# Patient Record
Sex: Male | Born: 1935 | Race: White | Hispanic: No | Marital: Married | Smoking: Former smoker
Health system: Southern US, Community
[De-identification: ages and names within clinical notes are randomized; demographics above are authoritative.]

## PROBLEM LIST (undated history)

## (undated) DIAGNOSIS — C801 Malignant (primary) neoplasm, unspecified: Secondary | ICD-10-CM

## (undated) DIAGNOSIS — J111 Influenza due to unidentified influenza virus with other respiratory manifestations: Secondary | ICD-10-CM

## (undated) DIAGNOSIS — J449 Chronic obstructive pulmonary disease, unspecified: Secondary | ICD-10-CM

## (undated) DIAGNOSIS — A09 Infectious gastroenteritis and colitis, unspecified: Secondary | ICD-10-CM

## (undated) DIAGNOSIS — R42 Dizziness and giddiness: Secondary | ICD-10-CM

## (undated) DIAGNOSIS — C4491 Basal cell carcinoma of skin, unspecified: Secondary | ICD-10-CM

## (undated) DIAGNOSIS — L719 Rosacea, unspecified: Secondary | ICD-10-CM

## (undated) HISTORY — DX: Infectious gastroenteritis and colitis, unspecified: A09

## (undated) HISTORY — DX: Malignant (primary) neoplasm, unspecified: C80.1

## (undated) HISTORY — PX: MELANOMA EXCISION WITH SENTINEL LYMPH NODE BIOPSY: SHX5267

## (undated) HISTORY — DX: Rosacea, unspecified: L71.9

## (undated) HISTORY — DX: Chronic obstructive pulmonary disease, unspecified: J44.9

---
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
## 1962-11-17 DIAGNOSIS — C439 Malignant melanoma of skin, unspecified: Secondary | ICD-10-CM

## 1962-11-17 HISTORY — DX: Malignant melanoma of skin, unspecified: C43.9

## 1963-11-18 DIAGNOSIS — C801 Malignant (primary) neoplasm, unspecified: Secondary | ICD-10-CM

## 1963-11-18 HISTORY — DX: Malignant (primary) neoplasm, unspecified: C80.1

## 1983-11-18 HISTORY — PX: SOFT TISSUE TUMOR RESECTION: SHX1054

## 1998-11-17 DIAGNOSIS — A09 Infectious gastroenteritis and colitis, unspecified: Secondary | ICD-10-CM

## 1998-11-17 HISTORY — DX: Infectious gastroenteritis and colitis, unspecified: A09

## 2006-01-19 ENCOUNTER — Ambulatory Visit: Payer: Self-pay | Admitting: Gastroenterology

## 2010-02-03 LAB — HM COLONOSCOPY

## 2011-02-10 ENCOUNTER — Ambulatory Visit: Payer: Self-pay | Admitting: Gastroenterology

## 2011-02-12 LAB — PATHOLOGY REPORT

## 2011-12-05 ENCOUNTER — Encounter: Payer: Self-pay | Admitting: Internal Medicine

## 2011-12-05 ENCOUNTER — Ambulatory Visit (INDEPENDENT_AMBULATORY_CARE_PROVIDER_SITE_OTHER): Payer: Medicare Other | Admitting: Internal Medicine

## 2011-12-05 DIAGNOSIS — Z0000 Encounter for general adult medical examination without abnormal findings: Secondary | ICD-10-CM

## 2011-12-05 DIAGNOSIS — E785 Hyperlipidemia, unspecified: Secondary | ICD-10-CM

## 2011-12-05 DIAGNOSIS — R5383 Other fatigue: Secondary | ICD-10-CM

## 2011-12-05 DIAGNOSIS — Z85820 Personal history of malignant melanoma of skin: Secondary | ICD-10-CM

## 2011-12-05 DIAGNOSIS — A09 Infectious gastroenteritis and colitis, unspecified: Secondary | ICD-10-CM

## 2011-12-05 DIAGNOSIS — E559 Vitamin D deficiency, unspecified: Secondary | ICD-10-CM

## 2011-12-05 DIAGNOSIS — C801 Malignant (primary) neoplasm, unspecified: Secondary | ICD-10-CM

## 2011-12-05 NOTE — Progress Notes (Signed)
Subjective:    Patient ID: Jon Sherman, male    DOB: 1928-09-26, 78 y.o.   MRN: [id number]  HPI  Review of Systems    Objective:   Physical Exam    Assessment & Plan:   Subjective:    Jon Sherman is a 78 y.o. male who presents for Medicare Annual/Subsequent preventive examination.   Preventive Screening-Counseling & Management  Tobacco History  Smoking status  . Former Smoker  Smokeless tobacco  . Not on file    Problems Prior to Visit 1. History of Melanoma 2. Colonic Polyps with FH of colon CA in cousin  3. Rosacea  Current Problems (verified) There is no problem list on file for this patient.   Medications Prior to Visit No current outpatient prescriptions on file prior to visit.    Current Medications (verified) Current Outpatient Prescriptions  Medication Sig Dispense Refill  . aspirin 81 MG tablet Take 160 mg by mouth daily.      . Sodium Sulfide 1 % GEL Apply topically 2 (two) times daily.         Allergies (verified) Demerol   PAST HISTORY  Family History No family history on file.  Social History History  Substance Use Topics  . Smoking status: Former Games developer  . Smokeless tobacco: Not on file  . Alcohol Use: Yes     Occasional wine and beer with meal     Are there smokers in your home (other than you)?  No  Risk Factors Current exercise habits: Gym/ health club routine includes walking on track .  Dietary issues discussed: none   Cardiac risk factors: advanced age (older than 32 for men, 15 for women).  Depression Screen (Note: if answer to either of the following is "Yes", a more complete depression screening is indicated)   Q1: Over the past two weeks, have you felt down, depressed or hopeless? No  Q2: Over the past two weeks, have you felt little interest or pleasure in doing things? No  Have you lost interest or pleasure in daily life? No  Do you often feel hopeless? No  Do you cry easily over simple problems?  No  Activities of Daily Living In your present state of health, do you have any difficulty performing the following activities?:  Driving? No Managing money?  No Feeding yourself? No Getting from bed to chair? No Climbing a flight of stairs? No Preparing food and eating?: No Bathing or showering? No Getting dressed: No Getting to the toilet? No Using the toilet:No Moving around from place to place: No In the past year have you fallen or had a near fall?:No   Are you sexually active?  No  Do you have more than one partner?  No  Hearing Difficulties: No Do you often ask people to speak up or repeat themselves? No Do you experience ringing or noises in your ears? No Do you have difficulty understanding soft or whispered voices? No   Do you feel that you have a problem with memory? No  Do you often misplace items? No  Do you feel safe at home?  Yes  Cognitive Testing  Alert? Yes  Normal Appearance?Yes  Oriented to person? Yes  Place? Yes   Time? Yes  Recall of three objects?  Yes  Can perform simple calculations? Yes  Displays appropriate judgment?Yes  Can read the correct time from a watch face?Yes   Advanced Directives have been discussed with the patient? Yes   List the Names of  Other Physician/Practitioners you currently use: 1.    Indicate any recent Medical Services you may have received from other than [facility] providers in the past year (date may be approximate).   There is no immunization history on file for this patient.  Screening Tests No health maintenance topics applied.  All answers were reviewed with the patient and necessary referrals were made:  Duncan Dull, MD   12/05/2011   History reviewed: allergies, current medications, past family history, past medical history, past social history, past surgical history and problem list  Review of Systems A comprehensive review of systems was negative.    Objective:     Vision by Snellen chart: right  ZOX:WRUEAVW declines measurement, left UJW:JXBJYNW declines measurement Blood pressure 120/78, pulse 80, temperature 98.6 F (37 C), temperature source Oral, resp. rate 12, height 5\' 8"  (1.727 m), weight 187 lb (84.823 kg), SpO2 99.00%. Body mass index is 28.43 kg/(m^2).  BP 120/78  Pulse 80  Temp(Src) 98.6 F (37 C) (Oral)  Resp 12  Ht 5\' 8"  (1.727 m)  Wt 187 lb (84.823 kg)  BMI 28.43 kg/m2  SpO2 99%  General Appearance:    Alert, cooperative, no distress, appears stated age  Head:    Normocephalic, without obvious abnormality, atraumatic  Eyes:    PERRL, conjunctiva/corneas clear, EOM's intact, fundi    benign, both eyes       Ears:    Normal TM's and external ear canals, both ears  Nose:   Nares normal, septum midline, mucosa normal, no drainage    or sinus tenderness  Throat:   Lips, mucosa, and tongue normal; teeth and gums normal  Neck:   Supple, symmetrical, trachea midline, no adenopathy;       thyroid:  No enlargement/tenderness/nodules; no carotid   bruit or JVD  Back:     Symmetric, no curvature, ROM normal, no CVA tenderness  Lungs:     Clear to auscultation bilaterally, respirations unlabored  Chest wall:    No tenderness or deformity  Heart:    Regular rate and rhythm, S1 and S2 normal, no murmur, rub   or gallop  Abdomen:     Soft, non-tender, bowel sounds active all four quadrants,    no masses, no organomegaly.  Large surgical scar from prior melanoma excission   Genitalia:    Normal male without lesion, discharge or tenderness  Rectal:    Normal tone, normal prostate, no masses or tenderness;   guaiac negative stool  Extremities:   Extremities normal, atraumatic, no cyanosis or edema  Pulses:   2+ and symmetric all extremities  Skin:   Skin color, texture, turgor normal, no rashes or lesions  Lymph nodes:   Cervical, supraclavicular, and axillary nodes normal  Neurologic:   CNII-XII intact. Normal strength, sensation and reflexes      throughout         Assessment:    1.  Screening for prostate Ca:  DRE was done and normal. 2. Colonic Polyps:  Last colonoscopy 2011,  With polyps found and a FH of colon CA.  Repeat screening due 2014, [facility].      Plan:     During the course of the visit the patient was educated and counseled about appropriate screening and preventive services including:    Prostate cancer screening  Diet review for nutrition referral? Yes ____  Not Indicated _x___   Patient Instructions (the written plan) was given to the patient.  Medicare Attestation I have personally reviewed:  The patient's medical and social history Their use of alcohol, tobacco or illicit drugs Their current medications and supplements The patient's functional ability including ADLs,fall risks, home safety risks, cognitive, and hearing and visual impairment Diet and physical activities Evidence for depression or mood disorders  The patient's weight, height, BMI, and visual acuity have been recorded in the chart.  I have made referrals, counseling, and provided education to the patient based on review of the above and I have provided the patient with a written personalized care plan for preventive services.     Duncan Dull, MD   12/05/2011

## 2011-12-05 NOTE — Patient Instructions (Signed)
You should take 800 to 1000 units of Vit D3 daily, unless your level is low, requiring a prescription strength  I will check your vitamin d and thyroid with your future  labs.   Return for fasting labs at your convenience ( water is ok to drink beforehand)  An 8 hour fast is  sufficient

## 2011-12-07 ENCOUNTER — Encounter: Payer: Self-pay | Admitting: Internal Medicine

## 2011-12-07 DIAGNOSIS — L719 Rosacea, unspecified: Secondary | ICD-10-CM | POA: Insufficient documentation

## 2011-12-07 DIAGNOSIS — Z85820 Personal history of malignant melanoma of skin: Secondary | ICD-10-CM | POA: Insufficient documentation

## 2011-12-07 DIAGNOSIS — A09 Infectious gastroenteritis and colitis, unspecified: Secondary | ICD-10-CM | POA: Insufficient documentation

## 2011-12-07 NOTE — Assessment & Plan Note (Signed)
He has a huge scar on his abdomen  From the melanoma resection done in the 1960s.  He has had no recurrenc e and receives annual dermatologic followup at [facility] but is requesting change to local dermatologis,  Referral to [facility] made.

## 2011-12-07 NOTE — Assessment & Plan Note (Signed)
Remote hospitalization in 2000.  He has had no recurrent episodes.

## 2011-12-22 ENCOUNTER — Other Ambulatory Visit (INDEPENDENT_AMBULATORY_CARE_PROVIDER_SITE_OTHER): Payer: Medicare Other | Admitting: *Deleted

## 2011-12-22 DIAGNOSIS — E785 Hyperlipidemia, unspecified: Secondary | ICD-10-CM

## 2011-12-22 DIAGNOSIS — E559 Vitamin D deficiency, unspecified: Secondary | ICD-10-CM

## 2011-12-22 DIAGNOSIS — R5381 Other malaise: Secondary | ICD-10-CM

## 2011-12-22 DIAGNOSIS — R5383 Other fatigue: Secondary | ICD-10-CM

## 2011-12-22 LAB — COMPREHENSIVE METABOLIC PANEL
ALT: 19 U/L (ref 0–53)
AST: 16 U/L (ref 0–37)
Albumin: 3.9 g/dL (ref 3.5–5.2)
Alkaline Phosphatase: 66 U/L (ref 39–117)
BUN: 19 mg/dL (ref 6–23)
CO2: 28 mEq/L (ref 19–32)
Calcium: 8.6 mg/dL (ref 8.4–10.5)
Creatinine, Ser: 0.9 mg/dL (ref 0.4–1.5)
GFR: 81.73 mL/min (ref >60.00)
Glucose, Bld: 91 mg/dL (ref 70–99)

## 2011-12-22 LAB — CBC WITH DIFFERENTIAL/PLATELET
Basophils Absolute: 0 10*3/uL (ref 0.0–0.1)
Basophils Relative: 0.7 % (ref 0.0–3.0)
Hemoglobin: 14.7 g/dL (ref 13.0–17.0)
Lymphocytes Relative: 26.6 % (ref 12.0–46.0)
Lymphs Abs: 1.3 10*3/uL (ref 0.7–4.0)
Monocytes Absolute: 0.4 10*3/uL (ref 0.1–1.0)
Neutrophils Relative %: 61.6 % (ref 43.0–77.0)
RDW: 12.9 % (ref 11.5–14.6)
WBC: 5 10*3/uL (ref 4.5–10.5)

## 2011-12-22 LAB — TSH: TSH: 1.73 u[IU]/mL (ref 0.35–5.50)

## 2011-12-22 LAB — LIPID PANEL
Total CHOL/HDL Ratio: 3
Triglycerides: 84 mg/dL (ref 0.0–149.0)

## 2011-12-26 MED ORDER — ERGOCALCIFEROL 1.25 MG (50000 UT) PO CAPS: 50000.0000 [IU] | ORAL_CAPSULE | ORAL | Status: AC

## 2011-12-26 NOTE — Progress Notes (Signed)
Addended by: Duncan Dull on: 12/26/2011 08:34 AM   Modules accepted: Orders

## 2011-12-29 ENCOUNTER — Other Ambulatory Visit: Payer: Self-pay | Admitting: *Deleted

## 2011-12-29 MED ORDER — ERGOCALCIFEROL 1.25 MG (50000 UT) PO CAPS: 50000.0000 [IU] | ORAL_CAPSULE | ORAL | Status: DC

## 2012-02-19 ENCOUNTER — Other Ambulatory Visit: Payer: Self-pay | Admitting: Internal Medicine

## 2012-02-19 MED ORDER — ERGOCALCIFEROL 1.25 MG (50000 UT) PO CAPS: 50000.0000 [IU] | ORAL_CAPSULE | ORAL | Status: AC

## 2012-06-07 ENCOUNTER — Encounter: Payer: Self-pay | Admitting: *Deleted

## 2012-10-22 ENCOUNTER — Other Ambulatory Visit: Payer: Self-pay | Admitting: Internal Medicine

## 2012-12-23 ENCOUNTER — Telehealth: Payer: Self-pay | Admitting: Internal Medicine

## 2012-12-23 NOTE — Telephone Encounter (Signed)
Pt notified to call [facility] to set up that appt.

## 2012-12-23 NOTE — Telephone Encounter (Signed)
Pt states he has received a letter from [facility] that he needs to set up a colonoscopy.  Pt has no upcoming appts to see Dr. Darrick Huntsman.  Please advise.

## 2013-02-18 ENCOUNTER — Encounter: Payer: Self-pay | Admitting: Internal Medicine

## 2013-02-18 ENCOUNTER — Ambulatory Visit (INDEPENDENT_AMBULATORY_CARE_PROVIDER_SITE_OTHER): Payer: Medicare PPO | Admitting: Internal Medicine

## 2013-02-18 VITALS — BP 128/68 | HR 70 | Temp 97.4°F | Resp 16 | Ht 69.0 in | Wt 191.5 lb

## 2013-02-18 DIAGNOSIS — R5381 Other malaise: Secondary | ICD-10-CM

## 2013-02-18 DIAGNOSIS — Z860100 Personal history of colon polyps, unspecified: Secondary | ICD-10-CM

## 2013-02-18 DIAGNOSIS — E559 Vitamin D deficiency, unspecified: Secondary | ICD-10-CM

## 2013-02-18 DIAGNOSIS — Z0000 Encounter for general adult medical examination without abnormal findings: Secondary | ICD-10-CM

## 2013-02-18 DIAGNOSIS — Z85820 Personal history of malignant melanoma of skin: Secondary | ICD-10-CM

## 2013-02-18 DIAGNOSIS — R5383 Other fatigue: Secondary | ICD-10-CM

## 2013-02-18 MED ORDER — TETANUS-DIPHTH-ACELL PERTUSSIS 5-2.5-18.5 LF-MCG/0.5 IM SUSP: 0.5000 mL | Freq: Once | INTRAMUSCULAR | Status: DC

## 2013-02-18 MED ORDER — DTAP-HEPATITIS B RECOMB-IPV IM SUSP: 0.5000 mL | Freq: Once | INTRAMUSCULAR | Status: DC

## 2013-02-18 NOTE — Progress Notes (Signed)
Patient ID: Jon Bhola., male   DOB: 03-09-1928, 79 y.o.   MRN: [id number]   The patient is here for his annual Medicare wellness examination and management of other chronic and acute problems. He is feeling well today and has no acute or chronic complaints. He had an episode of hip pain lasting about 3 days several weeks ago which resolved spontaneously.    The risk factors are reflected in the social history.  The roster of all physicians providing medical care to patient - is listed in the Snapshot section of the chart.  Activities of daily living:  The patient is 100% independent in all ADLs: dressing, toileting, feeding as well as independent mobility  Home safety : The patient has smoke detectors in the home. They wear seatbelts.  There are no firearms at home. There is no violence in the home.   There is no risks for hepatitis, STDs or HIV. There is no   history of blood transfusion. They have no travel history to infectious disease endemic areas of the world.  The patient has seen their dentist in the last six month. They have seen their eye doctor in the last year. They report no hearing difficulty with regard to whispered voices or television programs.  They have deferred audiologic testing in the last year.  They do not  have excessive sun exposure. Discussed the need for sun protection: hats, long sleeves and use of sunscreen if there is significant sun exposure.   Diet: the importance of a healthy diet is discussed. They do have a healthy diet.  The benefits of regular aerobic exercise were discussed. She walks 4 times per week ,  20 minutes.   Depression screen: there are no signs or vegative symptoms of depression- irritability, change in appetite, anhedonia, sadness/tearfullness.  Cognitive assessment: the patient manages all their financial and personal affairs and is actively engaged. They could relate day,date,year and events; recalled 2/3 objects at 3 minutes;  performed clock-face test normally.  The following portions of the patient's history were reviewed and updated as appropriate: allergies, current medications, past family history, past medical history,  past surgical history, past social history  and problem list.  Visual acuity was not assessed per patient preference since she has regular follow up with her ophthalmologist. Hearing and body mass index were assessed and reviewed.   During the course of the visit the patient was educated and counseled about appropriate screening and preventive services including : fall prevention , diabetes screening, nutrition counseling, colorectal cancer screening, and recommended immunizations.    Objective  BP 128/68  Pulse 70  Temp(Src) 97.4 F (36.3 C) (Oral)  Resp 16  Ht 5\' 9"  (1.753 m)  Wt 191 lb 8 oz (86.864 kg)  BMI 28.27 kg/m2  SpO2 99%  General Appearance:    Alert, cooperative, no distress, appears stated age  Head:    Normocephalic, without obvious abnormality, atraumatic  Eyes:    PERRL, conjunctiva/corneas clear, EOM's intact, fundi    benign, both eyes       Ears:    Normal TM's and external ear canals, both ears  Nose:   Nares normal, septum midline, mucosa normal, no drainage   or sinus tenderness  Throat:   Lips, mucosa, and tongue normal; teeth and gums normal  Neck:   Supple, symmetrical, trachea midline, no adenopathy;       thyroid:  No enlargement/tenderness/nodules; no carotid   bruit or JVD  Back:  Symmetric, no curvature, ROM normal, no CVA tenderness  Lungs:     Clear to auscultation bilaterally, respirations unlabored  Chest wall:    No tenderness or deformity  Heart:    Regular rate and rhythm, S1 and S2 normal, no murmur, rub   or gallop  Abdomen:     Soft, non-tender, bowel sounds active all four quadrants,    no masses, no organomegaly  Extremities:   Extremities normal, atraumatic, no cyanosis or edema  Pulses:   2+ and symmetric all extremities  Skin:    Skin color, texture, turgor normal, no rashes or lesions  Lymph nodes:   Cervical, supraclavicular, and axillary nodes normal  Neurologic:   CNII-XII intact. Normal strength, sensation and reflexes      throughout   Assessment and Plan  Personal history of colonic polyps And his last colonoscopy was in 2011 and he has already seen Dr. Marva Panda to plan repeat colonoscopy this year.  History of melanoma He receives regular followup by his dermatologist on an annual basis. There has been no recurrence of melanoma.  Routine general medical examination at a health care facility Annual comprehensive male exam was done excluding  testicular and prostate exam.  He is up-to-date on regular screenings.   Updated Medication List Outpatient Encounter Prescriptions as of 02/18/2013  Medication Sig Dispense Refill  . aspirin 81 MG tablet Take 160 mg by mouth daily.      . [EXPIRED] ergocalciferol (DRISDOL) 50000 UNITS capsule Take 1 capsule (50,000 Units total) by mouth once a week.  12 capsule  2  . Sodium Sulfide 1 % GEL Apply topically 2 (two) times daily.      . Vitamin D, Ergocalciferol, (DRISDOL) 50000 UNITS CAPS TAKE 1 CAPSULE (50,000 UNITS TOTAL) BY MOUTH ONCE A WEEK.  12 capsule  2  . TDaP (BOOSTRIX) 5-2.5-18.5 LF-MCG/0.5 injection Inject 0.5 mLs into the muscle once.  0.5 mL  0  . [DISCONTINUED] DTAP-hepatitis B recombinant-IPV (PEDIARIX) injection Inject 0.5 mLs into the muscle once.  0.5 mL  0   No facility-administered encounter medications on file as of 02/18/2013.

## 2013-02-18 NOTE — Patient Instructions (Addendum)
   You may use ibuprofen 2 caplets and 1 tylenol every 8 hours as needed for joint pain  Try using Eucerin or Aveeno moisturizer on your legs  Return at your convenience for fasting labs (black coffee only ,  Or of course water is recommended!)

## 2013-02-19 DIAGNOSIS — Z860100 Personal history of colon polyps, unspecified: Secondary | ICD-10-CM | POA: Insufficient documentation

## 2013-02-19 DIAGNOSIS — Z0001 Encounter for general adult medical examination with abnormal findings: Secondary | ICD-10-CM | POA: Insufficient documentation

## 2013-02-19 DIAGNOSIS — Z0000 Encounter for general adult medical examination without abnormal findings: Secondary | ICD-10-CM | POA: Insufficient documentation

## 2013-02-19 NOTE — Assessment & Plan Note (Signed)
And his last colonoscopy was in 2011 and he has already seen Dr. Marva Panda to plan repeat colonoscopy this year.

## 2013-02-19 NOTE — Assessment & Plan Note (Signed)
He receives regular followup by his dermatologist on an annual basis. There has been no recurrence of melanoma.

## 2013-02-19 NOTE — Assessment & Plan Note (Signed)
Annual comprehensive male exam was done excluding  testicular and prostate exam.  He is up-to-date on regular screenings.

## 2013-03-01 ENCOUNTER — Other Ambulatory Visit (INDEPENDENT_AMBULATORY_CARE_PROVIDER_SITE_OTHER): Payer: Medicare PPO

## 2013-03-01 DIAGNOSIS — R5383 Other fatigue: Secondary | ICD-10-CM

## 2013-03-01 DIAGNOSIS — Z0000 Encounter for general adult medical examination without abnormal findings: Secondary | ICD-10-CM

## 2013-03-01 DIAGNOSIS — E559 Vitamin D deficiency, unspecified: Secondary | ICD-10-CM

## 2013-03-01 DIAGNOSIS — R5381 Other malaise: Secondary | ICD-10-CM

## 2013-03-01 DIAGNOSIS — Z136 Encounter for screening for cardiovascular disorders: Secondary | ICD-10-CM

## 2013-03-01 LAB — TSH: TSH: 2.72 u[IU]/mL (ref 0.35–5.50)

## 2013-03-01 LAB — LIPID PANEL
Cholesterol: 157 mg/dL (ref 0–200)
HDL: 45.7 mg/dL (ref >39.00)
VLDL: 26.2 mg/dL (ref 0.0–40.0)

## 2013-03-01 LAB — CBC WITH DIFFERENTIAL/PLATELET
Basophils Relative: 0.6 % (ref 0.0–3.0)
Eosinophils Relative: 3.3 % (ref 0.0–5.0)
HCT: 43.8 % (ref 39.0–52.0)
Hemoglobin: 14.9 g/dL (ref 13.0–17.0)
Lymphocytes Relative: 25.6 % (ref 12.0–46.0)
Lymphs Abs: 1.3 10*3/uL (ref 0.7–4.0)
MCV: 93.1 fl (ref 78.0–100.0)
Neutro Abs: 3.2 10*3/uL (ref 1.4–7.7)

## 2013-03-01 LAB — COMPREHENSIVE METABOLIC PANEL
AST: 15 U/L (ref 0–37)
Albumin: 4.2 g/dL (ref 3.5–5.2)
Alkaline Phosphatase: 60 U/L (ref 39–117)
BUN: 16 mg/dL (ref 6–23)
CO2: 27 mEq/L (ref 19–32)

## 2013-03-02 LAB — VITAMIN D 25 HYDROXY (VIT D DEFICIENCY, FRACTURES): Vit D, 25-Hydroxy: 38 ng/mL (ref 30–89)

## 2013-03-07 ENCOUNTER — Other Ambulatory Visit: Payer: Medicare PPO

## 2013-05-09 ENCOUNTER — Ambulatory Visit: Payer: Self-pay | Admitting: Gastroenterology

## 2013-06-02 ENCOUNTER — Telehealth: Payer: Self-pay | Admitting: Internal Medicine

## 2013-06-02 LAB — HM COLONOSCOPY: HM Colonoscopy: 4

## 2013-06-08 ENCOUNTER — Encounter: Payer: Self-pay | Admitting: Internal Medicine

## 2013-06-23 ENCOUNTER — Encounter: Payer: Self-pay | Admitting: Gastroenterology

## 2013-07-25 ENCOUNTER — Encounter: Payer: Self-pay | Admitting: Adult Health

## 2013-07-25 ENCOUNTER — Ambulatory Visit (INDEPENDENT_AMBULATORY_CARE_PROVIDER_SITE_OTHER): Payer: Medicare PPO | Admitting: Adult Health

## 2013-07-25 VITALS — BP 128/60 | HR 70 | Temp 97.7°F | Resp 12 | Ht 69.0 in | Wt 185.0 lb

## 2013-07-25 DIAGNOSIS — Z23 Encounter for immunization: Secondary | ICD-10-CM

## 2013-07-25 DIAGNOSIS — H8110 Benign paroxysmal vertigo, unspecified ear: Secondary | ICD-10-CM

## 2013-07-25 MED ORDER — TETANUS-DIPHTH-ACELL PERTUSSIS 5-2.5-18.5 LF-MCG/0.5 IM SUSP: 0.5000 mL | Freq: Once | INTRAMUSCULAR | Status: DC

## 2013-07-25 NOTE — Patient Instructions (Addendum)
  Please take the prescription to your pharmacy for your tetanus shot.  Please call with any questions or concerns.  Schedule your Wellness Exam with Dr. Darrick Huntsman in April 2015

## 2013-07-25 NOTE — Assessment & Plan Note (Signed)
Episode on August 31 which subsided within a few hours. No symptoms currently.

## 2013-07-25 NOTE — Assessment & Plan Note (Signed)
Prescription for Tdap Vaccine sent to his pharmacy.

## 2013-07-25 NOTE — Progress Notes (Signed)
  Subjective:    Patient ID: Jon Musca., male    DOB: 06-29-1928, 79 y.o.   MRN: [id number]  HPI  Patient is a pleasant 79 year old male who presents to clinic status post episode of vertigo on August 31. He was seen by the health nurse at [facility]. He reports that everything was ok. The symptoms lasted less than 1 day and subsided on their own. He was also feeling slightly fatigue. 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 He reports having to stand for extended periods of time. He has stopped going to these sessions and the fatigue has improved. No other concerns this morning other than he states that he does not know what he did with the prescription for the Tdap vaccine Dr. Darrick Huntsman game him last time.   Current Outpatient Prescriptions on File Prior to Visit  Medication Sig Dispense Refill  . aspirin 81 MG tablet Take 160 mg by mouth daily.      . Sodium Sulfide 1 % GEL Apply topically 2 (two) times daily.      . Vitamin D, Ergocalciferol, (DRISDOL) 50000 UNITS CAPS TAKE 1 CAPSULE (50,000 UNITS TOTAL) BY MOUTH ONCE A WEEK.  12 capsule  2   No current facility-administered medications on file prior to visit.    Review of Systems  Constitutional: Positive for fatigue.       Improved fatigue  HENT:       Sinus drainage  Respiratory: Negative.   Cardiovascular: Negative.   Gastrointestinal: Negative.   Musculoskeletal: Negative.   Neurological: Negative for dizziness, syncope, weakness, light-headedness and headaches.  Psychiatric/Behavioral: Negative.      BP 128/60  Pulse 70  Temp(Src) 97.7 F (36.5 C) (Oral)  Resp 12  Ht 5\' 9"  (1.753 m)  Wt 185 lb (83.915 kg)  BMI 27.31 kg/m2  SpO2 98%     Objective:   Physical Exam  Constitutional: He is oriented to person, place, and time. He appears well-developed and well-nourished. No distress.  HENT:  Head: Normocephalic and atraumatic.  Right Ear: External ear  normal.  Left Ear: External ear normal.  Drainage noted posterior pharynx  Cardiovascular: Normal rate, regular rhythm, normal heart sounds and intact distal pulses.  Exam reveals no gallop and no friction rub.   No murmur heard. Pulmonary/Chest: Effort normal and breath sounds normal. No respiratory distress. He has no wheezes. He has no rales.  Abdominal: Soft. Bowel sounds are normal.  Musculoskeletal: Normal range of motion.  Neurological: He is alert and oriented to person, place, and time.  Skin: Skin is warm and dry.  Psychiatric: He has a normal mood and affect. His behavior is normal. Judgment and thought content normal.          Assessment & Plan:

## 2013-08-01 ENCOUNTER — Other Ambulatory Visit: Payer: Self-pay | Admitting: Internal Medicine

## 2013-12-09 ENCOUNTER — Telehealth: Payer: Self-pay | Admitting: Internal Medicine

## 2013-12-09 ENCOUNTER — Ambulatory Visit: Payer: Self-pay | Admitting: Family

## 2013-12-09 NOTE — Telephone Encounter (Signed)
Mrs. Pineo called back.  States she is going to cancel the appt in [place].  States the pt does not feel that he can make the long ride there.  Also states she was mistaken when she told [place] what he had eaten for lunch yesterday.  States he did not have yogurt, instead had leftover meatloaf and slaw.  States that could be what is causing his indigestion.  States he has not slept in 24 hours due to the indigestion.  Would like to advise Dr. Derrel Nip of what is going on and that he will not be seen in [facility] today.

## 2013-12-09 NOTE — Telephone Encounter (Signed)
Fwd to [employer]

## 2013-12-09 NOTE — Telephone Encounter (Signed)
Patient Information:  Caller Name: [name]  Phone: [phone]  Patient: Jon Sherman, Jon Sherman  Gender: Male  DOB: 08/08/1928  Age: 80 Years  PCP: Deborra Medina (Adults only)  Office Follow Up:  Does the office need to follow up with this patient?: No  Instructions For The Office: N/A   Symptoms  Reason For Call & Symptoms: Calling about [place] starting with pain in upper Abdomen at 1630-12/08/13 and called RN at [facility] residential facility at midnight- who advised ER. BP= 170/80.  Afebrile. He did not go to ER and slept on and off through the night in [place].  This morning he feels tired  Reviewed Health History In EMR: Yes  Reviewed Medications In EMR: Yes  Reviewed Allergies In EMR: Yes  Reviewed Surgeries / Procedures: Yes  Date of Onset of Symptoms: 12/08/2013  Treatments Tried: Gas Relief Xtra Strength- took 3 tablets, Antiacid tabs 2 PO at midnight  Treatments Tried Worked: Yes  Guideline(s) Used:  Abdominal Pain - Upper  Disposition Per Guideline:   See Today in Office  Reason For Disposition Reached:   Age > 60 years  Advice Given:  Reassurance:  A mild stomachache can be from indigestion, stomach irritation, or overeating. Sometimes a stomachache signals the onset of a vomiting illness from a viral infection.  Fluids:   Sip clear fluids only (e.g., water, flat soft drinks, or half-strength fruit juice) until the pain is gone for 2 hours. Then slowly return to a regular diet.  Antacid:  If having pain now, try taking an antacid (e.g., Mylanta, Maalox). Dose: 2 tablespoons (30 ml) of liquid by mouth.  Expected Course:  With harmless causes, the pain usually lessens or is resolved in 2 hours. With gastroenteritis, stomach cramps may precede each bout of vomiting or diarrhea. With serious causes (such as appendicitis), the pain becomes constant and severe.  Call Back If:  Abdominal pain is constant and present for more than 2 hours.  You become worse.  Patient Will  Follow Care Advice:  YES  Appointment Scheduled:  12/09/2013 13:45:00 Appointment Scheduled Provider:  NP- Inda Castle at [facility]

## 2013-12-09 NOTE — Telephone Encounter (Signed)
Nothing else.  Tell him to get Beano and use it at the beginnin of any meal containing cabbage, broccoli, cauliflower,. brussel sprouts or legumes

## 2013-12-09 NOTE — Telephone Encounter (Signed)
Patient is sleeping now and has not complained of chest pain today. Patient stated to wife he is to tired to go to appointment at [facility]. Advised his wife if chest pain reoccurs that patient should go to ER , patient wife refused anything today stated patient is just to tired form not sleeping. Please advise, if you would like anything else since wife refused advice.

## 2013-12-16 NOTE — Telephone Encounter (Signed)
Spoke to pt told him to get Beano and use it at the beginning of any meal containing cabbage, broccoli, cauliflower,. brussel sprouts or legumes to help with gas. Pt verbalized understanding.

## 2014-02-22 ENCOUNTER — Encounter (INDEPENDENT_AMBULATORY_CARE_PROVIDER_SITE_OTHER): Payer: Self-pay

## 2014-02-22 ENCOUNTER — Ambulatory Visit (INDEPENDENT_AMBULATORY_CARE_PROVIDER_SITE_OTHER): Payer: Medicare PPO | Admitting: Internal Medicine

## 2014-02-22 ENCOUNTER — Encounter: Payer: Self-pay | Admitting: Internal Medicine

## 2014-02-22 VITALS — BP 138/68 | HR 70 | Temp 97.4°F | Resp 16 | Ht 68.75 in | Wt 189.2 lb

## 2014-02-22 DIAGNOSIS — Z125 Encounter for screening for malignant neoplasm of prostate: Secondary | ICD-10-CM

## 2014-02-22 DIAGNOSIS — Z8619 Personal history of other infectious and parasitic diseases: Secondary | ICD-10-CM

## 2014-02-22 DIAGNOSIS — Z0000 Encounter for general adult medical examination without abnormal findings: Secondary | ICD-10-CM

## 2014-02-22 DIAGNOSIS — E785 Hyperlipidemia, unspecified: Secondary | ICD-10-CM

## 2014-02-22 DIAGNOSIS — E559 Vitamin D deficiency, unspecified: Secondary | ICD-10-CM

## 2014-02-22 DIAGNOSIS — Z85820 Personal history of malignant melanoma of skin: Secondary | ICD-10-CM

## 2014-02-22 DIAGNOSIS — R5383 Other fatigue: Secondary | ICD-10-CM

## 2014-02-22 DIAGNOSIS — Z860100 Personal history of colon polyps, unspecified: Secondary | ICD-10-CM

## 2014-02-22 DIAGNOSIS — R5381 Other malaise: Secondary | ICD-10-CM

## 2014-02-22 LAB — LIPID PANEL
CHOL/HDL RATIO: 3
Cholesterol: 161 mg/dL (ref 0–200)
HDL: 46.9 mg/dL (ref >39.00)
LDL Cholesterol: 81 mg/dL (ref 0–99)
TRIGLYCERIDES: 167 mg/dL — AB (ref 0.0–149.0)
VLDL: 33.4 mg/dL (ref 0.0–40.0)

## 2014-02-22 LAB — CBC WITH DIFFERENTIAL/PLATELET
Basophils Absolute: 0.1 10*3/uL (ref 0.0–0.1)
Basophils Relative: 0.9 % (ref 0.0–3.0)
Eosinophils Absolute: 0.2 10*3/uL (ref 0.0–0.7)
Eosinophils Relative: 4.2 % (ref 0.0–5.0)
HEMATOCRIT: 44.5 % (ref 39.0–52.0)
Hemoglobin: 15.1 g/dL (ref 13.0–17.0)
LYMPHS ABS: 1.3 10*3/uL (ref 0.7–4.0)
Lymphocytes Relative: 23.7 % (ref 12.0–46.0)
MCHC: 33.9 g/dL (ref 30.0–36.0)
MCV: 94 fl (ref 78.0–100.0)
Monocytes Absolute: 0.5 10*3/uL (ref 0.1–1.0)
Monocytes Relative: 8.5 % (ref 3.0–12.0)
NEUTROS ABS: 3.5 10*3/uL (ref 1.4–7.7)
Neutrophils Relative %: 62.7 % (ref 43.0–77.0)
Platelets: 226 10*3/uL (ref 150.0–400.0)
RBC: 4.73 Mil/uL (ref 4.22–5.81)
RDW: 12.8 % (ref 11.5–14.6)
WBC: 5.7 10*3/uL (ref 4.5–10.5)

## 2014-02-22 LAB — COMPREHENSIVE METABOLIC PANEL
ALBUMIN: 4.1 g/dL (ref 3.5–5.2)
ALT: 20 U/L (ref 0–53)
AST: 17 U/L (ref 0–37)
Alkaline Phosphatase: 63 U/L (ref 39–117)
BUN: 15 mg/dL (ref 6–23)
CALCIUM: 9.1 mg/dL (ref 8.4–10.5)
CO2: 29 meq/L (ref 19–32)
Chloride: 104 mEq/L (ref 96–112)
Creatinine, Ser: 1 mg/dL (ref 0.4–1.5)
GFR: 77.48 mL/min (ref >60.00)
GLUCOSE: 99 mg/dL (ref 70–99)
Potassium: 4.4 mEq/L (ref 3.5–5.1)
SODIUM: 138 meq/L (ref 135–145)
Total Bilirubin: 1.1 mg/dL (ref 0.3–1.2)
Total Protein: 6.8 g/dL (ref 6.0–8.3)

## 2014-02-22 LAB — TSH: TSH: 2.25 u[IU]/mL (ref 0.35–5.50)

## 2014-02-22 NOTE — Assessment & Plan Note (Signed)
Recurrent by 2014 colonoscopy.  Follow up due 2017

## 2014-02-22 NOTE — Patient Instructions (Signed)
You had your annual Medicare wellness exam today  We will contact you with the bloodwork results

## 2014-02-22 NOTE — Assessment & Plan Note (Signed)
No recurrence by annual exam by Brendolyn Patty

## 2014-02-22 NOTE — Progress Notes (Signed)
Patient ID: Jon Sherman., male   DOB: 1928/06/24, 80 y.o.   MRN: [id number]   A The patient is here for his annual wellness exam /male physical examination and management of other chronic and acute problems.  Has noted decreased energy on his mile walk, has decreased his distance .  Denies chest pain,  LE edema . Thinks he may be out of shape and plans to increase his walk.  Planning to go to the [employer].    The risk factors are reflected in the social history.  The roster of all physicians providing medical care to patient - is listed in the Snapshot section of the chart.  Activities of daily living:  The patient is 100% independent in all ADLs: dressing, toileting, feeding as well as independent mobility  Home safety : The patient has smoke detectors in the home. He wears seatbelts.  There are no firearms at home. There is no violence in the home.   There is no risks for hepatitis, STDs or HIV. There is no   history of blood transfusion. There is no travel history to infectious disease endemic areas of the world.  The patient has seen their dentist in the last six month and  their eye doctor in the last year.  They do not  have excessive sun exposure. They have seen a dermatoloigist in the last year. Discussed the need for sun protection: hats, long sleeves and use of sunscreen if there is significant sun exposure.   Diet: the importance of a healthy diet is discussed. They do have a healthy diet.  The benefits of regular aerobic exercise were discussed. He exercises a minimum of 30 minutes  5 days per week. Depression screen: there are no signs or vegative symptoms of depression- irritability, change in appetite, anhedonia, sadness/tearfullness.  The following portions of the patient's history were reviewed and updated as appropriate: allergies, current medications, past family history, past medical history,  past surgical history, past social history  and problem list.  Visual  acuity was not assessed per patient preference since he has regular follow up with his ophthalmologist. Hearing and body mass index were assessed and reviewed.   During the course of the visit the patient was educated and counseled about appropriate screening and preventive services including :  nutrition counseling, colorectal cancer screening, and recommended immunizations.    Objective:  BP 138/68  Pulse 70  Temp(Src) 97.4 F (36.3 C) (Oral)  Resp 16  Ht 5' 8.75" (1.746 m)  Wt 189 lb 4 oz (85.843 kg)  BMI 28.16 kg/m2  SpO2 98%  General Appearance:    Alert, cooperative, no distress, appears stated age  Head:    Normocephalic, without obvious abnormality, atraumatic  Eyes:    PERRL, conjunctiva/corneas clear, EOM's intact, fundi    benign, both eyes       Ears:    Normal TM's and external ear canals, both ears  Nose:   Nares normal, septum midline, mucosa normal, no drainage   or sinus tenderness  Throat:   Lips, mucosa, and tongue normal; teeth and gums normal  Neck:   Supple, symmetrical, trachea midline, no adenopathy;       thyroid:  No enlargement/tenderness/nodules; no carotid   bruit or JVD  Back:     Symmetric, no curvature, ROM normal, no CVA tenderness  Lungs:     Clear to auscultation bilaterally, respirations unlabored  Chest wall:    No tenderness or deformity  Heart:  Regular rate and rhythm, S1 and S2 normal, no murmur, rub   or gallop  Abdomen:     Soft, non-tender, bowel sounds active all four quadrants,    no masses, no organomegaly  Genitalia:    Normal male without lesion, discharge or tenderness  Rectal:    Normal tone, normal prostate, no masses or tenderness;   guaiac negative stool  Extremities:   Extremities normal, atraumatic, no cyanosis or edema  Pulses:   2+ and symmetric all extremities  Skin:   Multiple cherry angiomas on chest,  Skin color, texture, turgor normal, no rashes or lesions  Lymph nodes:   Cervical, supraclavicular, and axillary  nodes normal  Neurologic:   CNII-XII intact. Normal strength, sensation and reflexes      throughout   Assessment and Plan:  History of melanoma No recurrence by annual exam by Brendolyn Patty   History of shingles Left facial nerve distribution ,no blisters  2011.   Routine general medical examination at a health care facility .Annual male exam was done including testicular and prostate exam. PSA was not done due to age .  Colon ca screening was reviewed and up to date   Personal history of colonic polyps Recurrent by 2014 colonoscopy.  Follow up due 2017   Updated Medication List Outpatient Encounter Prescriptions as of 02/22/2014  Medication Sig  . aspirin 81 MG tablet Take 160 mg by mouth daily.  . Multiple Vitamins-Minerals (PRESERVISION AREDS 2) CAPS Take 1 capsule by mouth 2 (two) times daily.  . Sodium Sulfide 1 % GEL Apply topically 2 (two) times daily.  . TDaP (BOOSTRIX) 5-2.5-18.5 LF-MCG/0.5 injection Inject 0.5 mLs into the muscle once.  . Vitamin D, Ergocalciferol, (DRISDOL) 50000 UNITS CAPS capsule TAKE 1 CAPSULE (50,000 UNITS TOTAL) BY MOUTH ONCE A WEEK.  . [DISCONTINUED] Multiple Vitamins-Minerals (ICAPS) CAPS Take by mouth.

## 2014-02-22 NOTE — Assessment & Plan Note (Signed)
.  Annual male exam was done including testicular and prostate exam. PSA was not done due to age .  Colon ca screening was reviewed and up to date

## 2014-02-22 NOTE — Assessment & Plan Note (Signed)
Left facial nerve distribution ,no blisters  2011.

## 2014-02-23 LAB — VITAMIN D 25 HYDROXY (VIT D DEFICIENCY, FRACTURES): Vit D, 25-Hydroxy: 35 ng/mL (ref 30–89)

## 2014-02-24 ENCOUNTER — Encounter: Payer: Self-pay | Admitting: *Deleted

## 2014-03-20 ENCOUNTER — Other Ambulatory Visit: Payer: Self-pay | Admitting: *Deleted

## 2014-03-20 MED ORDER — VITAMIN D (ERGOCALCIFEROL) 1.25 MG (50000 UNIT) PO CAPS: ORAL_CAPSULE | ORAL | Status: DC

## 2014-03-20 NOTE — Telephone Encounter (Signed)
Refill

## 2014-09-01 ENCOUNTER — Other Ambulatory Visit: Payer: Self-pay | Admitting: Internal Medicine

## 2014-11-20 ENCOUNTER — Ambulatory Visit (INDEPENDENT_AMBULATORY_CARE_PROVIDER_SITE_OTHER): Payer: Medicare PPO | Admitting: Internal Medicine

## 2014-11-20 ENCOUNTER — Encounter: Payer: Self-pay | Admitting: Internal Medicine

## 2014-11-20 VITALS — BP 140/68 | HR 77 | Temp 97.5°F | Resp 16 | Ht 68.75 in | Wt 192.8 lb

## 2014-11-20 DIAGNOSIS — K6289 Other specified diseases of anus and rectum: Secondary | ICD-10-CM

## 2014-11-20 DIAGNOSIS — Z23 Encounter for immunization: Secondary | ICD-10-CM

## 2014-11-20 DIAGNOSIS — R159 Full incontinence of feces: Secondary | ICD-10-CM | POA: Insufficient documentation

## 2014-11-20 NOTE — Progress Notes (Signed)
Patient ID: Jon Dutter., male   DOB: 27-Jun-1928, 81 y.o.   MRN: [id number]   Patient Active Problem List   Diagnosis Date Noted  . Fecal incontinence 11/20/2014  . History of shingles 02/22/2014  . Benign paroxysmal positional vertigo 07/25/2013  . Personal history of colonic polyps 02/19/2013  . Routine general medical examination at a health care facility 02/19/2013  . History of melanoma   . Rosacea     Subjective:  CC:   Chief Complaint  Patient presents with  . Encopresis    frequent BM  but not always incontent just frequently BM  urgency.    HPI:   Jon Mazon. is a 81 y.o. male who presents for    CHANGE IN BOWEL HABITS.  Patient reports that he has developed a pattern of having increased frequency of formed stools without pain,  Usually  2 or 3 stools daily  With occasional fecal incontinence.  The change was first observed on or around Dec 1 , although he noted having a second BM by evening around Sept/Oct.  After dinner: formed always.  Habit: small solid BM before breakfast, then anothLast colonoscopy July 2014, multiple adenomas found.     Past Medical History  Diagnosis Date  . Infectious colitis 2000    requiring hospitalization  . Rosacea   . Cancer 1965    melanoma, LNs resected    Past Surgical History  Procedure Laterality Date  . Melanoma excision with sentinel lymph node biopsy    . Soft tissue tumor resection  1985       The following portions of the patient's history were reviewed and updated as appropriate: Allergies, current medications, and problem list.    Review of Systems:   Patient denies headache, fevers, malaise, unintentional weight loss, skin rash, eye pain, sinus congestion and sinus pain, sore throat, dysphagia,  hemoptysis , cough, dyspnea, wheezing, chest pain, palpitations, orthopnea, edema, abdominal pain, nausea, melena, diarrhea, constipation, flank pain, dysuria, hematuria, urinary  Frequency, nocturia,  numbness, tingling, seizures,  Focal weakness, Loss of consciousness,  Tremor, insomnia, depression, anxiety, and suicidal ideation.     History   Social History  . Marital Status: Married    Spouse Name: N/A    Number of Children: N/A  . Years of Education: N/A   Occupational History  . Not on file.   Social History Main Topics  . Smoking status: Former Smoker    Types: Cigarettes    Quit date: 02/22/1962  . Smokeless tobacco: Not on file  . Alcohol Use: 3.5 oz/week    7 drink(s) per week     Comment: Occasional wine and beer with meal   . Drug Use: No  . Sexual Activity: No   Other Topics Concern  . Not on file   Social History Narrative    Objective:  Filed Vitals:   11/20/14 1552  BP: 140/68  Pulse: 77  Temp: 97.5 F (36.4 C)  Resp: 16    BP 140/68 mmHg  Pulse 77  Temp(Src) 97.5 F (36.4 C) (Oral)  Resp 16  Ht 5' 8.75" (1.746 m)  Wt 192 lb 12 oz (87.431 kg)  BMI 28.68 kg/m2  SpO2 97%  General Appearance:    Alert, cooperative, no distress, appears stated age  Head:    Normocephalic, without obvious abnormality, atraumatic  Eyes:    PERRL, conjunctiva/corneas clear, EOM's intact, fundi    benign, both eyes       Ears:  Normal TM's and external ear canals, both ears  Nose:   Nares normal, septum midline, mucosa normal, no drainage   or sinus tenderness  Throat:   Lips, mucosa, and tongue normal; teeth and gums normal  Neck:   Supple, symmetrical, trachea midline, no adenopathy;       thyroid:  No enlargement/tenderness/nodules; no carotid   bruit or JVD  Back:     Symmetric, no curvature, ROM normal, no CVA tenderness  Lungs:     Clear to auscultation bilaterally, respirations unlabored  Chest wall:    No tenderness or deformity  Heart:    Regular rate and rhythm, S1 and S2 normal, no murmur, rub   or gallop  Abdomen:     Soft, non-tender, bowel sounds active all four quadrants,    no masses, no organomegaly  Genitalia:    Normal male without  lesion, discharge or tenderness  Rectal:    Decreased rectal tone, normal prostate, no masses or tenderness; anal wink present      Extremities:   Extremities normal, atraumatic, no cyanosis or edema  Pulses:   2+ and symmetric all extremities  Skin:   Skin color, texture, turgor normal, no rashes or lesions  Lymph nodes:   Cervical, supraclavicular, and axillary nodes normal  Neurologic:   CNII-XII intact. 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 Advised to try adding bulk forming laxatives.      Updated Medication List Outpatient Encounter Prescriptions as of 11/20/2014  Medication Sig  . aspirin 81 MG tablet Take 160 mg by mouth daily.  . Multiple Vitamins-Minerals (PRESERVISION AREDS 2) CAPS Take 1 capsule by mouth 2 (two) times daily.  . Sodium Sulfide 1 % GEL Apply topically 2 (two) times daily.  . TDaP (BOOSTRIX) 5-2.5-18.5 LF-MCG/0.5 injection Inject 0.5 mLs into the muscle once.  . Vitamin D, Ergocalciferol, (DRISDOL) 50000 UNITS CAPS capsule TAKE 1 CAPSULE (50,000 UNITS TOTAL) BY MOUTH ONCE A WEEK.     Orders Placed This Encounter  Procedures  . Pneumococcal conjugate vaccine 13-valent  . Ambulatory referral to Gastroenterology    No Follow-up on file.

## 2014-11-20 NOTE — Patient Instructions (Signed)
Your sphincter tone is weak.  This may be due to several conditions and contributes to your incontinence episodes  Referral to dr Darletta Moll is underway figure out what is causing it  You can try bulk forming laxatives to see if they help "bulk up" your stools

## 2014-11-20 NOTE — Progress Notes (Signed)
Pre-visit discussion using our clinic review tool. No additional management support is needed unless otherwise documented below in the visit note.  

## 2014-11-21 ENCOUNTER — Ambulatory Visit: Payer: Medicare PPO | Admitting: Internal Medicine

## 2014-11-21 NOTE — Assessment & Plan Note (Signed)
With increased stools and anal sphincter incompetence on exam  Refer to Dr Gustavo Lah for manometry and anuscopy. Advised to try adding bulk forming laxatives.

## 2014-11-27 DIAGNOSIS — H40003 Preglaucoma, unspecified, bilateral: Secondary | ICD-10-CM | POA: Diagnosis not present

## 2014-11-29 DIAGNOSIS — H40003 Preglaucoma, unspecified, bilateral: Secondary | ICD-10-CM | POA: Diagnosis not present

## 2015-02-24 ENCOUNTER — Other Ambulatory Visit: Payer: Self-pay | Admitting: Internal Medicine

## 2015-02-26 ENCOUNTER — Encounter: Payer: Self-pay | Admitting: Internal Medicine

## 2015-02-26 ENCOUNTER — Ambulatory Visit (INDEPENDENT_AMBULATORY_CARE_PROVIDER_SITE_OTHER): Payer: Medicare PPO | Admitting: Internal Medicine

## 2015-02-26 VITALS — BP 128/60 | HR 63 | Temp 97.4°F | Resp 16 | Ht 68.75 in | Wt 190.8 lb

## 2015-02-26 DIAGNOSIS — E785 Hyperlipidemia, unspecified: Secondary | ICD-10-CM | POA: Diagnosis not present

## 2015-02-26 DIAGNOSIS — E559 Vitamin D deficiency, unspecified: Secondary | ICD-10-CM

## 2015-02-26 DIAGNOSIS — Z0000 Encounter for general adult medical examination without abnormal findings: Secondary | ICD-10-CM

## 2015-02-26 DIAGNOSIS — Z1159 Encounter for screening for other viral diseases: Secondary | ICD-10-CM | POA: Diagnosis not present

## 2015-02-26 DIAGNOSIS — R5383 Other fatigue: Secondary | ICD-10-CM | POA: Diagnosis not present

## 2015-02-26 LAB — CBC WITH DIFFERENTIAL/PLATELET
BASOS ABS: 0 10*3/uL (ref 0.0–0.1)
Basophils Relative: 0.7 % (ref 0.0–3.0)
Eosinophils Absolute: 0.2 10*3/uL (ref 0.0–0.7)
Eosinophils Relative: 3.7 % (ref 0.0–5.0)
HCT: 44.1 % (ref 39.0–52.0)
Hemoglobin: 15.3 g/dL (ref 13.0–17.0)
LYMPHS PCT: 32.7 % (ref 12.0–46.0)
Lymphs Abs: 1.7 10*3/uL (ref 0.7–4.0)
MCHC: 34.6 g/dL (ref 30.0–36.0)
MCV: 91.3 fl (ref 78.0–100.0)
MONOS PCT: 8.2 % (ref 3.0–12.0)
Monocytes Absolute: 0.4 10*3/uL (ref 0.1–1.0)
NEUTROS PCT: 54.7 % (ref 43.0–77.0)
Neutro Abs: 2.9 10*3/uL (ref 1.4–7.7)
PLATELETS: 221 10*3/uL (ref 150.0–400.0)
RBC: 4.83 Mil/uL (ref 4.22–5.81)
RDW: 12.9 % (ref 11.5–15.5)
WBC: 5.2 10*3/uL (ref 4.0–10.5)

## 2015-02-26 LAB — LIPID PANEL
CHOL/HDL RATIO: 3
Cholesterol: 161 mg/dL (ref 0–200)
HDL: 50.6 mg/dL (ref >39.00)
LDL Cholesterol: 83 mg/dL (ref 0–99)
NonHDL: 110.4
Triglycerides: 139 mg/dL (ref 0.0–149.0)
VLDL: 27.8 mg/dL (ref 0.0–40.0)

## 2015-02-26 LAB — TSH: TSH: 3.01 u[IU]/mL (ref 0.35–4.50)

## 2015-02-26 LAB — VITAMIN D 25 HYDROXY (VIT D DEFICIENCY, FRACTURES): VITD: 38.26 ng/mL (ref 30.00–100.00)

## 2015-02-26 NOTE — Progress Notes (Signed)
Pre-visit discussion using our clinic review tool. No additional management support is needed unless otherwise documented below in the visit note.  

## 2015-02-26 NOTE — Progress Notes (Signed)
Patient ID: Jon Tucholski., male   DOB: 1928-11-11, 81 y.o.   MRN: [id number]   The patient is here for annual Medicare wellness examination and management of other chronic and acute problems:  1)  He has had no subsequent b bowel or sphincter issues since his last visit when he was referred to GI.  Per their exam his sphincter tone was normal and no further workup was advised.  2)  He has Occasional insomina,  No daytime hypersomnolence unless reclining or watching TV.    3) He denies joint or back pain .    Lives at [facility], walks twice or three times weekly.   4) Some seasonal allergies,  Not enough to take meds.  Eyes water and ome sneezing .    MMSE performed and patient scored 28/30    The risk factors are reflected in the social history.  The roster of all physicians providing medical care to patient - is listed in the Snapshot section of the chart.  Activities of daily living:  The patient is 100% independent in all ADLs: dressing, toileting, feeding as well as independent mobility  Home safety : The patient has smoke detectors in the home. They wear seatbelts.  There are no firearms at home. There is no violence in the home.   There is no risks for hepatitis, STDs or HIV. There is no   history of blood transfusion. They have no travel history to infectious disease endemic areas of the world.  The patient has seen their dentist in the last six month. They have seen their eye doctor in the last year. They admit to slight hearing difficulty with regard to whispered voices and some television programs.  They have deferred audiologic testing in the last year.  They do not  have excessive sun exposure. Discussed the need for sun protection: hats, long sleeves and use of sunscreen if there is significant sun exposure.   Diet: the importance of a healthy diet is discussed. They do have a healthy diet.  The benefits of regular aerobic exercise were discussed. he walks 2 times per  week ,  20 minutes.   Depression screen: there are no signs or vegative symptoms of depression- irritability, change in appetite, anhedonia, sadness/tearfullness.  Cognitive assessment: the patient manages all their financial and personal affairs and is actively engaged. They could relate day,date,year and events; recalled 2/3 objects at 3 minutes; performed clock-face test normally.  The following portions of the patient's history were reviewed and updated as appropriate: allergies, current medications, past family history, past medical history,  past surgical history, past social history  and problem list.  Visual acuity was not assessed per patient preference since she has regular follow up with her ophthalmologist. Hearing and body mass index were assessed and reviewed.   During the course of the visit the patient was educated and counseled about appropriate screening and preventive services including : fall prevention , diabetes screening, nutrition counseling, colorectal cancer screening, and recommended immunizations.    Review of Systems:  Patient denies headache, fevers, malaise, unintentional weight loss, skin rash, eye pain, sinus congestion and sinus pain, sore throat, dysphagia,  hemoptysis , cough, dyspnea, wheezing, chest pain, palpitations, orthopnea, edema, abdominal pain, nausea, melena, diarrhea, constipation, flank pain, dysuria, hematuria, urinary  Frequency, nocturia, numbness, tingling, seizures,  Focal weakness, Loss of consciousness,  Tremor, insomnia, depression, anxiety, and suicidal ideation.     Objective:  BP 128/60 mmHg  Pulse 63  Temp(Src)  97.4 F (36.3 C) (Oral)  Resp 16  Ht 5' 8.75" (1.746 m)  Wt 190 lb 12 oz (86.524 kg)  BMI 28.38 kg/m2  SpO2 98%  General appearance: alert, cooperative and appears stated age Ears: normal TM's and external ear canals both ears Throat: lips, mucosa, and tongue normal; teeth and gums normal Neck: no adenopathy, no  carotid bruit, supple, symmetrical, trachea midline and thyroid not enlarged, symmetric, no tenderness/mass/nodules Back: symmetric, no curvature. ROM normal. No CVA tenderness. Lungs: clear to auscultation bilaterally Heart: regular rate and rhythm, S1, S2 normal, no murmur, click, rub or gallop Abdomen: soft, non-tender; bowel sounds normal; no masses,  no organomegaly Pulses: 2+ and symmetric Skin: Skin color, texture, turgor normal. No rashes or lesions Lymph nodes: Cervical, supraclavicular, and axillary nodes normal.   Assessment and Plan:   Problem List Items Addressed This Visit      Unprioritized   Routine general medical examination at a health care facility    Annual Medicare wellness  exam was done as well as a comprehensive physical exam and management of acute and chronic conditions .  During the course of the visit the patient was educated and counseled about appropriate screening and preventive services including : fall prevention , diabetes screening, nutrition counseling, colorectal cancer screening, and recommended immunizations.  Printed recommendations for health maintenance screenings was given.       Fatigue - Primary    Screening  for anemia, thyroid disorder, renal insufficiency and OSA was done with serologies and history . Patient advised to increase his walking to daily ,  20 minutes.  Lab Results  Component Value Date   WBC 5.2 02/26/2015   HGB 15.3 02/26/2015   HCT 44.1 02/26/2015   MCV 91.3 02/26/2015   PLT 221.0 02/26/2015   Lab Results  Component Value Date   TSH 3.01 02/26/2015   Lab Results  Component Value Date   CREATININE 1.0 02/22/2014         Relevant Orders   TSH (Completed)   CBC with Differential/Platelet (Completed)   Vitamin D deficiency    Recurrent ,  Requiring use of Drisdol long term for the last several months,  will repeat level and transition to daily supplements if improved,       Relevant Orders   Vit D  25  hydroxy (rtn osteoporosis monitoring) (Completed)    Other Visit Diagnoses    Need for hepatitis C screening test        Relevant Orders    Hepatitis C antibody (Completed)    Hyperlipidemia        Relevant Orders    Lipid panel (Completed)

## 2015-02-26 NOTE — Patient Instructions (Addendum)
You are doing well!    I recommend a daily walk for at least 20 minutes to keep your leg muscles strong.  You can stop the Vitamin D supplement that I was prescribing.   Check your AREDs vitamin and see how much vit D is is int.  You need 1000 IU's daily of  D3   There are several previously prescribed medications for seasonal allergies that are now available otc. She can try  allegra (fexofenadine 180 mg daily), zyrtec (cetirizine 10 mg daily) and claritin (loratadine 10 mg daily) .  If those fail, the next step is a steroid nasal spray which I can prescribe.   Health Maintenance A healthy lifestyle and preventative care can promote health and wellness.  Maintain regular health, dental, and eye exams.  Eat a healthy diet. Foods like vegetables, fruits, whole grains, low-fat dairy products, and lean protein foods contain the nutrients you need and are low in calories. Decrease your intake of foods high in solid fats, added sugars, and salt. Get information about a proper diet from your health care provider, if necessary.  Regular physical exercise is one of the most important things you can do for your health. Most adults should get at least 150 minutes of moderate-intensity exercise (any activity that increases your heart rate and causes you to sweat) each week. In addition, most adults need muscle-strengthening exercises on 2 or more days a week.   Maintain a healthy weight. The body mass index (BMI) is a screening tool to identify possible weight problems. It provides an estimate of body fat based on height and weight. Your health care provider can find your BMI and can help you achieve or maintain a healthy weight. For males 20 years and older:  A BMI below 18.5 is considered underweight.  A BMI of 18.5 to 24.9 is normal.  A BMI of 25 to 29.9 is considered overweight.  A BMI of 30 and above is considered obese.  Maintain normal blood lipids and cholesterol by exercising and  minimizing your intake of saturated fat. Eat a balanced diet with plenty of fruits and vegetables. Blood tests for lipids and cholesterol should begin at age 23 and be repeated every 5 years. If your lipid or cholesterol levels are high, you are over age 52, or you are at high risk for heart disease, you may need your cholesterol levels checked more frequently.Ongoing high lipid and cholesterol levels should be treated with medicines if diet and exercise are not working.  If you smoke, find out from your health care provider how to quit. If you do not use tobacco, do not start.  Lung cancer screening is recommended for adults aged 64-80 years who are at high risk for developing lung cancer because of a history of smoking. A yearly low-dose CT scan of the lungs is recommended for people who have at least a 30-pack-year history of smoking and are current smokers or have quit within the past 15 years. A pack year of smoking is smoking an average of 1 pack of cigarettes a day for 1 year (for example, a 30-pack-year history of smoking could mean smoking 1 pack a day for 30 years or 2 packs a day for 15 years). Yearly screening should continue until the smoker has stopped smoking for at least 15 years. Yearly screening should be stopped for people who develop a health problem that would prevent them from having lung cancer treatment.  If you choose to drink alcohol, do  not have more than 2 drinks per day. One drink is considered to be 12 oz (360 mL) of beer, 5 oz (150 mL) of wine, or 1.5 oz (45 mL) of liquor.  Avoid the use of street drugs. Do not share needles with anyone. Ask for help if you need support or instructions about stopping the use of drugs.  High blood pressure causes heart disease and increases the risk of stroke. Blood pressure should be checked at least every 1-2 years. Ongoing high blood pressure should be treated with medicines if weight loss and exercise are not effective.  If you are  98-39 years old, ask your health care provider if you should take aspirin to prevent heart disease.  Diabetes screening involves taking a blood sample to check your fasting blood sugar level. This should be done once every 3 years after age 28 if you are at a normal weight and without risk factors for diabetes. Testing should be considered at a younger age or be carried out more frequently if you are overweight and have at least 1 risk factor for diabetes.  Colorectal cancer can be detected and often prevented. Most routine colorectal cancer screening begins at the age of 29 and continues through age 5. However, your health care provider may recommend screening at an earlier age if you have risk factors for colon cancer. On a yearly basis, your health care provider may provide home test kits to check for hidden blood in the stool. A small camera at the end of a tube may be used to directly examine the colon (sigmoidoscopy or colonoscopy) to detect the earliest forms of colorectal cancer. Talk to your health care provider about this at age 21 when routine screening begins. A direct exam of the colon should be repeated every 5-10 years through age 23, unless early forms of precancerous polyps or small growths are found.  People who are at an increased risk for hepatitis B should be screened for this virus. You are considered at high risk for hepatitis B if:  You were born in a country where hepatitis B occurs often. Talk with your health care provider about which countries are considered high risk.  Your parents were born in a high-risk country and you have not received a shot to protect against hepatitis B (hepatitis B vaccine).  You have HIV or AIDS.  You use needles to inject street drugs.  You live with, or have sex with, someone who has hepatitis B.  You are a man who has sex with other men (MSM).  You get hemodialysis treatment.  You take certain medicines for conditions like cancer, organ  transplantation, and autoimmune conditions.  Hepatitis C blood testing is recommended for all people born from [birth year] through 1965 and any individual with known risk factors for hepatitis C.  Healthy men should no longer receive prostate-specific antigen (PSA) blood tests as part of routine cancer screening. Talk to your health care provider about prostate cancer screening.  Testicular cancer screening is not recommended for adolescents or adult males who have no symptoms. Screening includes self-exam, a health care provider exam, and other screening tests. Consult with your health care provider about any symptoms you have or any concerns you have about testicular cancer.  Practice safe sex. Use condoms and avoid high-risk sexual practices to reduce the spread of sexually transmitted infections (STIs).  You should be screened for STIs, including gonorrhea and chlamydia if:  You are sexually active and are younger than  24 years.  You are older than 24 years, and your health care provider tells you that you are at risk for this type of infection.  Your sexual activity has changed since you were last screened, and you are at an increased risk for chlamydia or gonorrhea. Ask your health care provider if you are at risk.  If you are at risk of being infected with HIV, it is recommended that you take a prescription medicine daily to prevent HIV infection. This is called pre-exposure prophylaxis (PrEP). You are considered at risk if:  You are a man who has sex with other men (MSM).  You are a heterosexual man who is sexually active with multiple partners.  You take drugs by injection.  You are sexually active with a partner who has HIV.  Talk with your health care provider about whether you are at high risk of being infected with HIV. If you choose to begin PrEP, you should first be tested for HIV. You should then be tested every 3 months for as long as you are taking PrEP.  Use sunscreen. Apply  sunscreen liberally and repeatedly throughout the day. You should seek shade when your shadow is shorter than you. Protect yourself by wearing long sleeves, pants, a wide-brimmed hat, and sunglasses year round whenever you are outdoors.  Tell your health care provider of new moles or changes in moles, especially if there is a change in shape or color. Also, tell your health care provider if a mole is larger than the size of a pencil eraser.  A one-time screening for abdominal aortic aneurysm (AAA) and surgical repair of large AAAs by ultrasound is recommended for men aged 62-75 years who are current or former smokers.  Stay current with your vaccines (immunizations). Document Released: 05/01/2008 Document Revised: 11/08/2013 Document Reviewed: 03/31/2011 [facility] Patient Information 2015 [place], [place]. This information is not intended to replace advice given to you by your health care provider. Make sure you discuss any questions you have with your health care provider.

## 2015-02-26 NOTE — Telephone Encounter (Signed)
Pt has appt today, refill?

## 2015-02-27 ENCOUNTER — Encounter: Payer: Self-pay | Admitting: Internal Medicine

## 2015-02-27 DIAGNOSIS — E559 Vitamin D deficiency, unspecified: Secondary | ICD-10-CM | POA: Insufficient documentation

## 2015-02-27 LAB — HEPATITIS C ANTIBODY: HCV Ab: NEGATIVE

## 2015-02-27 NOTE — Assessment & Plan Note (Signed)
Screening  for anemia, thyroid disorder, renal insufficiency and OSA was done with serologies and history . Patient advised to increase his walking to daily ,  20 minutes.  Lab Results  Component Value Date   WBC 5.2 02/26/2015   HGB 15.3 02/26/2015   HCT 44.1 02/26/2015   MCV 91.3 02/26/2015   PLT 221.0 02/26/2015   Lab Results  Component Value Date   TSH 3.01 02/26/2015   Lab Results  Component Value Date   CREATININE 1.0 02/22/2014

## 2015-02-27 NOTE — Assessment & Plan Note (Signed)

## 2015-02-27 NOTE — Assessment & Plan Note (Signed)
Recurrent ,  Requiring use of Drisdol long term for the last several months,  will repeat level and transition to daily supplements if improved,

## 2015-02-28 ENCOUNTER — Encounter: Payer: Self-pay | Admitting: *Deleted

## 2015-05-29 DIAGNOSIS — H3531 Nonexudative age-related macular degeneration: Secondary | ICD-10-CM | POA: Diagnosis not present

## 2015-06-15 ENCOUNTER — Telehealth: Payer: Self-pay

## 2015-06-15 NOTE — Telephone Encounter (Signed)
Error

## 2015-07-09 DIAGNOSIS — H35072 Retinal telangiectasis, left eye: Secondary | ICD-10-CM | POA: Diagnosis not present

## 2015-07-09 DIAGNOSIS — H3531 Nonexudative age-related macular degeneration: Secondary | ICD-10-CM | POA: Diagnosis not present

## 2015-07-16 ENCOUNTER — Telehealth: Payer: Self-pay | Admitting: Internal Medicine

## 2015-07-16 NOTE — Telephone Encounter (Signed)
Patient scheduled with dr. Lacinda Axon

## 2015-07-16 NOTE — Telephone Encounter (Signed)
[facility]  Patient Name: Jon Sherman  DOB: 1928-10-25    Initial Comment Caller states he has been having occasional numbness and tingling front of legs   Nurse Assessment  Nurse: Justine Null, RN, Rodena Piety Date/Time (Eastern Time): 07/16/2015 2:10:35 PM  Confirm and document reason for call. If symptomatic, describe symptoms. ---Caller states he has been having occasional numbness and tingling front of legs on both of his legs and has been between the knees and ankle area and has for the past 2 weeks and has loss of the sensation and has been having off and on and had no fevers and has no injury to his legs and has had no back injury  Has the patient traveled out of the country within the last 30 days? ---No  Does the patient require triage? ---Yes  Related visit to physician within the last 2 weeks? ---Yes  Does the PT have any chronic conditions? (i.e. diabetes, asthma, etc.) ---No     Guidelines    Guideline Title Affirmed Question Affirmed Notes  Neurologic Deficit [1] Numbness or tingling in one or both feet AND [2] is a chronic symptom (recurrent or ongoing AND present > 4 weeks)    Final Disposition User   See PCP When Office is Open (within 3 days) Justine Null, RN, Rodena Piety    Comments  call placed to the office back line as unable to make an appointment Please call the patient to schedule an appointment   Disagree/Comply: Comply

## 2015-07-17 ENCOUNTER — Encounter: Payer: Self-pay | Admitting: Family Medicine

## 2015-07-17 ENCOUNTER — Ambulatory Visit (INDEPENDENT_AMBULATORY_CARE_PROVIDER_SITE_OTHER): Payer: Medicare PPO | Admitting: Family Medicine

## 2015-07-17 VITALS — BP 130/70 | HR 86 | Temp 97.9°F | Ht 68.75 in | Wt 192.5 lb

## 2015-07-17 DIAGNOSIS — R208 Other disturbances of skin sensation: Secondary | ICD-10-CM | POA: Diagnosis not present

## 2015-07-17 DIAGNOSIS — R200 Anesthesia of skin: Secondary | ICD-10-CM

## 2015-07-17 NOTE — Progress Notes (Signed)
Pre visit review using our clinic review tool, if applicable. No additional management support is needed unless otherwise documented below in the visit note. 

## 2015-07-17 NOTE — Progress Notes (Signed)
   Subjective:  Patient ID: Jon Sherman., male    DOB: [birth year]  Age: 81 y.o. MRN: [id number]  CC: Leg numbness and tingling.  HPI:  81 year old male presents to clinic today with complaints of lower leg numbness and tingling.  1) Leg Numbness & Tingling  Patient reports that he has had anterior lower leg numbness and tingling for the past several weeks.  He states that it is intermittent.  He reports that it alternates between numbness and tingling.  No associated pain or discomfort, just an odd sensation.  Patient does not have a past history of diabetes. No associated fevers or chills. No associated weakness. Patient denies any saddle anesthesia or urinary incontinence.  No exacerbating or relieving factors.  Patient states that it is stable and does not appear to be worsening.  No interventions tried.  Social Hx  - Former smoker.  Review of Systems  Constitutional: Negative for fever and chills.  Neurological: Positive for numbness. Negative for weakness.   Objective:  BP 130/70 mmHg  Pulse 86  Temp(Src) 97.9 F (36.6 C) (Oral)  Ht 5' 8.75" (1.746 m)  Wt 192 lb 8 oz (87.317 kg)  BMI 28.64 kg/m2  SpO2 96%  BP/Weight 07/17/2015 8/93/7342 06/23/6810  Systolic BP 572 620 355  Diastolic BP 70 60 68  Wt. (Lbs) 192.5 190.75 192.75  BMI 28.64 28.38 28.68   Physical Exam  Constitutional: He appears well-developed and well-nourished. No distress.  Cardiovascular: Normal rate and regular rhythm.   No murmur heard. Pulmonary/Chest: Breath sounds normal. No respiratory distress. He has no wheezes.  Abdominal: Soft. He exhibits no distension. There is no tenderness.  Neurological: He is alert.  Sensation to sharp/pain and soft touch intact.  No weakness noted.     Assessment & Plan:   Problem List Items Addressed This Visit    Lower extremity numbness - Primary    Exam unremarkable today. 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 I  discussed workup and potential treatment options (x-ray lumbar spine, EMG, gabapentin/Lyrica). Patient does not want to proceed at this time and will continue monitoring at home. If he worsens or fails to improve will proceed with workup.         Meds ordered this encounter  Medications  . Sulfacetamide Sodium 10 % SUSP    Sig: Apply topically as directed.    Follow-up: PRN   Thersa Salt, DO

## 2015-07-17 NOTE — Assessment & Plan Note (Signed)
Exam unremarkable today. 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 I discussed workup and potential treatment options (x-ray lumbar spine, EMG, gabapentin/Lyrica). Patient does not want to proceed at this time and will continue monitoring at home. If he worsens or fails to improve will proceed with workup.

## 2015-07-17 NOTE — Patient Instructions (Signed)
It was nice to see you today.  Please let me know if it worsens and we will start the work up.  Take care  Dr. Lacinda Axon

## 2016-02-20 ENCOUNTER — Telehealth: Payer: Self-pay | Admitting: Internal Medicine

## 2016-02-20 NOTE — Telephone Encounter (Signed)
Pt called to sch his AWV. Pt states avail all next week and next three after that week.  Call pt @ [phone]. Thank you!

## 2016-02-28 ENCOUNTER — Ambulatory Visit: Payer: Medicare Other

## 2016-03-05 ENCOUNTER — Ambulatory Visit (INDEPENDENT_AMBULATORY_CARE_PROVIDER_SITE_OTHER): Payer: Medicare Other

## 2016-03-05 VITALS — BP 130/70 | HR 66 | Temp 98.1°F | Resp 14 | Ht 68.0 in | Wt 193.1 lb

## 2016-03-05 DIAGNOSIS — Z0000 Encounter for general adult medical examination without abnormal findings: Secondary | ICD-10-CM

## 2016-03-05 NOTE — Patient Instructions (Addendum)
  Jon Sherman , Thank you for taking time to come for your Medicare Wellness Visit. I appreciate your ongoing commitment to your health goals. Please review the following plan we discussed and let me know if I can assist you in the future.   Follow up with Dr. Derrel Nip as needed.   This is a list of the screening recommended for you and due dates:  Health Maintenance  Topic Date Due  . Shingles Vaccine  03/05/2017*  . Flu Shot  06/17/2016  . Tetanus Vaccine  02/23/2023  . Pneumonia vaccines  Completed  *Topic was postponed. The date shown is not the original due date.

## 2016-03-05 NOTE — Progress Notes (Signed)
Subjective:   Jon Sherman. is a 82 y.o. male who presents for Medicare Annual/Subsequent preventive examination.  Review of Systems:  No ROS.  Medicare Wellness Visit.  Cardiac Risk Factors include: advanced age (>66men, >22 women);male gender     Objective:    Vitals: BP 130/70 mmHg  Pulse 66  Temp(Src) 98.1 F (36.7 C) (Oral)  Resp 14  Ht 5\' 8"  (1.727 m)  Wt 193 lb 1.9 oz (87.599 kg)  BMI 29.37 kg/m2  SpO2 99%  Body mass index is 29.37 kg/(m^2).  Tobacco History  Smoking status  . Former Smoker  . Types: Cigarettes  . Quit date: 02/22/1962  Smokeless tobacco  . Never Used     Counseling given: Not Answered   Past Medical History  Diagnosis Date  . Infectious colitis 2000    requiring hospitalization  . Rosacea   . Cancer ([place]) 1965    melanoma, LNs resected   Past Surgical History  Procedure Laterality Date  . Melanoma excision with sentinel lymph node biopsy    . Soft tissue tumor resection  1985   Family History  Problem Relation Age of Onset  . Mental illness Mother   . Heart failure Mother   . Cancer Neg Hx   . Drug abuse Neg Hx   . Heart failure Father   . Rheumatic fever Sister    History  Sexual Activity  . Sexual Activity: No    Outpatient Encounter Prescriptions as of 03/05/2016  Medication Sig  . aspirin 81 MG tablet Take 160 mg by mouth daily.  . Multiple Vitamins-Minerals (PRESERVISION AREDS 2) CAPS Take 1 capsule by mouth 2 (two) times daily.  . Vitamin D, Ergocalciferol, (DRISDOL) 50000 UNITS CAPS capsule TAKE 1 CAPSULE (50,000 UNITS TOTAL) BY MOUTH ONCE A WEEK.  . [DISCONTINUED] Sodium Sulfide 1 % GEL Apply topically 2 (two) times daily.  . [DISCONTINUED] Sulfacetamide Sodium 10 % SUSP Apply topically as directed.   No facility-administered encounter medications on file as of 03/05/2016.    Activities of Daily Living In your present state of health, do you have any difficulty performing the following activities:  03/05/2016  Hearing? N  Vision? N  Difficulty concentrating or making decisions? N  Walking or climbing stairs? N  Dressing or bathing? N  Doing errands, shopping? N  Preparing Food and eating ? N  Using the Toilet? N  In the past six months, have you accidently leaked urine? N  Do you have problems with loss of bowel control? N  Managing your Medications? N  Managing your Finances? N  Housekeeping or managing your Housekeeping? N    Patient Care Team: Crecencio Mc, MD as PCP - General (Internal Medicine) Crecencio Mc, MD (Internal Medicine)   Assessment:   This is a routine wellness examination for Jon Sherman. The goal of the wellness visit is to assist the patient how to close the gaps in care and create a preventative care plan for the patient.   Taking VIT D as appropriate/Osteoporosis risk reviewed.  Medications reviewed; taking without issues or barriers.  Safety issues reviewed; smoke detectors in the home. Firearms locked in a safe area in the home. Wears seatbelts when driving or riding with others. No violence in the home.  No identified risk were noted; The patient was oriented x 3; appropriate in dress and manner and no objective failures at ADL's or IADL's.   ZOSTAVAX vaccine postponed, per patient request.  Patient Concerns:  None at  this time.  Follow up with PCP as needed.  Exercise Activities and Dietary recommendations Current Exercise Habits: The patient does not participate in regular exercise at present  Goals    . Healthy Lifestyle     Stay hydrated and drink plenty of fluids. Low carb foods.  Lean meats and vegetables. Stay active and begin to exercise by walking or participating in facility program, as tolerated.      Fall Risk Fall Risk  03/05/2016 02/27/2015 02/26/2015 11/20/2014  Falls in the past year? No No No No   Depression Screen PHQ 2/9 Scores 03/05/2016 02/27/2015 02/26/2015 11/20/2014  PHQ - 2 Score 0 0 0 0    Cognitive Testing MMSE  - Mini Mental State Exam 03/05/2016  Orientation to time 5  Orientation to Place 5  Registration 3  Attention/ Calculation 5  Recall 3  Language- name 2 objects 2  Language- repeat 1  Language- follow 3 step command 3  Language- read & follow direction 1  Write a sentence 1  Copy design 1  Total score 30    Immunization History  Administered Date(s) Administered  . Influenza Split 09/12/2013  . Influenza-Unspecified 09/17/2012, 09/15/2014  . Pneumococcal Conjugate-13 11/20/2014  . Pneumococcal Polysaccharide-23 11/20/2005  . Tdap 02/22/2013   Screening Tests Health Maintenance  Topic Date Due  . ZOSTAVAX  03/05/2017 (Originally 04/20/1990)  . INFLUENZA VACCINE  06/17/2016  . TETANUS/TDAP  02/23/2023  . PNA vac Low Risk Adult  Completed      Plan:   End of life planning; Advance aging; Advanced directives discussed. Copy of current HCPOA/Living Will requested.   During the course of the visit the patient was educated and counseled about the following appropriate screening and preventive services:   Vaccines to include Pneumoccal, Influenza, Hepatitis B, Td, Zostavax, HCV  Electrocardiogram  Cardiovascular Disease  Colorectal cancer screening  Diabetes screening  Prostate Cancer Screening  Glaucoma screening  Nutrition counseling   Smoking cessation counseling  Patient Instructions (the written plan) was given to the patient.    Varney Biles, LPN  [id number]

## 2016-03-05 NOTE — Progress Notes (Signed)
  I have reviewed the above information and agree with above.   Bernadine Melecio, MD 

## 2017-03-05 ENCOUNTER — Ambulatory Visit: Payer: Medicare Other

## 2017-03-09 ENCOUNTER — Ambulatory Visit: Payer: Medicare Other

## 2017-03-11 ENCOUNTER — Ambulatory Visit: Payer: Medicare Other

## 2017-03-18 ENCOUNTER — Ambulatory Visit (INDEPENDENT_AMBULATORY_CARE_PROVIDER_SITE_OTHER): Payer: Medicare Other

## 2017-03-18 VITALS — BP 130/64 | HR 62 | Temp 97.9°F | Resp 12 | Ht 67.0 in | Wt 193.4 lb

## 2017-03-18 DIAGNOSIS — Z0000 Encounter for general adult medical examination without abnormal findings: Secondary | ICD-10-CM

## 2017-03-18 DIAGNOSIS — Z1389 Encounter for screening for other disorder: Secondary | ICD-10-CM | POA: Diagnosis not present

## 2017-03-18 NOTE — Progress Notes (Signed)
Subjective:   Jon Sherman. is a 83 y.o. male who presents for Medicare Annual/Subsequent preventive examination.  Review of Systems:  No ROS.  Medicare Wellness Visit.  Cardiac Risk Factors include: advanced age (>42men, >25 women);male gender     Objective:    Vitals: Resp 12   Ht 5\' 7"  (1.702 m)   Wt 193 lb 6.4 oz (87.7 kg)   BMI 30.29 kg/m   Body mass index is 30.29 kg/m.  Tobacco History  Smoking Status  . Former Smoker  . Types: Cigarettes  . Quit date: 02/22/1962  Smokeless Tobacco  . Never Used     Counseling given: Not Answered   Past Medical History:  Diagnosis Date  . Cancer ([place]) 1965   melanoma, LNs resected  . Infectious colitis 2000   requiring hospitalization  . Rosacea    Past Surgical History:  Procedure Laterality Date  . MELANOMA EXCISION WITH SENTINEL LYMPH NODE BIOPSY    . SOFT TISSUE TUMOR RESECTION  1985   Family History  Problem Relation Age of Onset  . Mental illness Mother   . Heart failure Mother   . Heart failure Father   . Rheumatic fever Sister   . Cancer Neg Hx   . Drug abuse Neg Hx    History  Sexual Activity  . Sexual activity: No    Outpatient Encounter Prescriptions as of 03/18/2017  Medication Sig  . aspirin 81 MG tablet Take 160 mg by mouth daily.  . metroNIDAZOLE (METROCREAM) 0.75 % cream Apply topically.  . Multiple Vitamins-Minerals (PRESERVISION AREDS 2) CAPS Take 1 capsule by mouth 2 (two) times daily.  . [DISCONTINUED] Vitamin D, Ergocalciferol, (DRISDOL) 50000 UNITS CAPS capsule TAKE 1 CAPSULE (50,000 UNITS TOTAL) BY MOUTH ONCE A WEEK.   No facility-administered encounter medications on file as of 03/18/2017.     Activities of Daily Living In your present state of health, do you have any difficulty performing the following activities: 03/18/2017  Hearing? N  Vision? N  Difficulty concentrating or making decisions? N  Walking or climbing stairs? N  Dressing or bathing? N  Doing errands, shopping? N   Preparing Food and eating ? N  Using the Toilet? N  In the past six months, have you accidently leaked urine? N  Do you have problems with loss of bowel control? N  Managing your Medications? N  Managing your Finances? N  Housekeeping or managing your Housekeeping? N  Some recent data might be hidden    Patient Care Team: Crecencio Mc, MD as PCP - General (Internal Medicine) Crecencio Mc, MD (Internal Medicine)   Assessment:    This is a routine wellness examination for Jon Sherman. The goal of the wellness visit is to assist the patient how to close the gaps in care and create a preventative care plan for the patient.   Osteoporosis risk reviewed.  Medications reviewed; taking without issues or barriers.  Safety issues reviewed; smoke detectors in the home. No firearms in the home.  Wears seatbelts when driving or riding with others. Patient does wear sunscreen or protective clothing when in direct sunlight. No violence in the home.  Depression- PHQ 2 &9 complete.  No signs/symptoms or verbal communication regarding little pleasure in doing things, feeling down, depressed or hopeless. No changes in sleeping, energy, eating, concentrating.  No thoughts of self harm or harm towards others.  Time spent on this topic is 10 minutes.   Patient is alert, normal appearance, oriented  to person/place/and time. Correctly identified the president of the [place], recall of 3/3 words, and performing simple calculations.  Patient displays appropriate judgement and can read correct time from watch face.  No new identified risk were noted.  No failures at ADL's or IADL's.   BMI- discussed the importance of a healthy diet, water intake and exercise. Educational material provided.   24 hour diet recall: Breakfast: Cereal, fruit Lunch: Deli sandwich Dinner: [employer], pasta Daily fluid intake: 1cup of caffeine, 1, cup juice, 1cup of water  Dental- every six months.  Dr. Sherril Cong.  Eye- Visual  acuity not assessed per patient preference since they have regular follow up with the ophthalmologist.  Wears corrective lenses.  Sleep patterns- Sleeps 7-8 hours at night.  Wakes feeling rested.   Health maintenance gaps- closed.  Patient Concerns: None at this time. Follow up with PCP as needed.  Exercise Activities and Dietary recommendations Current Exercise Habits: Home exercise routine, Type of exercise: walking, Time (Minutes): 20, Frequency (Times/Week): 4, Weekly Exercise (Minutes/Week): 80, Intensity: Mild  Goals    . Healthy Lifestyle          Stay hydrated and drink plenty of fluids. Low carb foods.  Lean meats and vegetables. Stay active and begin to exercise by walking or participating in facility program, as tolerated.      Fall Risk Fall Risk  03/18/2017 03/05/2016 02/27/2015 02/26/2015 11/20/2014  Falls in the past year? No No No No No   Depression Screen PHQ 2/9 Scores 03/18/2017 03/05/2016 02/27/2015 02/26/2015  PHQ - 2 Score 0 0 0 0  PHQ- 9 Score 0 - - -    Cognitive Function MMSE - Mini Mental State Exam 03/05/2016  Orientation to time 5  Orientation to Place 5  Registration 3  Attention/ Calculation 5  Recall 3  Language- name 2 objects 2  Language- repeat 1  Language- follow 3 step command 3  Language- read & follow direction 1  Write a sentence 1  Copy design 1  Total score 30     6CIT Screen 03/18/2017  What Year? 0 points  What month? 0 points  What time? 0 points  Count back from 20 0 points  Months in reverse 0 points  Repeat phrase 0 points  Total Score 0    Immunization History  Administered Date(s) Administered  . Influenza Split 09/12/2013  . Influenza-Unspecified 09/17/2012, 09/15/2014  . Pneumococcal Conjugate-13 11/20/2014  . Pneumococcal Polysaccharide-23 11/20/2005  . Tdap 02/22/2013   Screening Tests Health Maintenance  Topic Date Due  . INFLUENZA VACCINE  06/17/2017  . TETANUS/TDAP  02/23/2023  . PNA vac Low Risk Adult   Completed      Plan:    End of life planning; Advance aging; Advanced directives discussed. Copy of current HCPOA/Living Will requested.    I have personally reviewed and noted the following in the patient's chart:   . Medical and social history . Use of alcohol, tobacco or illicit drugs  . Current medications and supplements . Functional ability and status . Nutritional status . Physical activity . Advanced directives . List of other physicians . Hospitalizations, surgeries, and ER visits in previous 12 months . Vitals . Screenings to include cognitive, depression, and falls . Referrals and appointments  In addition, I have reviewed and discussed with patient certain preventive protocols, quality metrics, and best practice recommendations. A written personalized care plan for preventive services as well as general preventive health recommendations were provided to patient.  Varney Biles, LPN  9/0/9030

## 2017-03-18 NOTE — Patient Instructions (Addendum)
  Mr. People , Thank you for taking time to come for your Medicare Wellness Visit. I appreciate your ongoing commitment to your health goals. Please review the following plan we discussed and let me know if I can assist you in the future.   Follow up with Dr. Derrel Nip as needed.    Bring a copy of your [place] and/or Living Will to be scanned into chart.  Have a great day!  These are the goals we discussed: Goals    . Healthy Lifestyle          Stay hydrated and drink plenty of fluids. Low carb foods.  Lean meats and vegetables. Stay active and begin to exercise by walking or participating in facility program, as tolerated.       This is a list of the screening recommended for you and due dates:  Health Maintenance  Topic Date Due  . Flu Shot  06/17/2017  . Tetanus Vaccine  02/23/2023  . Pneumonia vaccines  Completed

## 2017-03-19 NOTE — Progress Notes (Signed)
  I have reviewed the above information and agree with above.   Edker Punt, MD 

## 2017-06-04 ENCOUNTER — Ambulatory Visit (INDEPENDENT_AMBULATORY_CARE_PROVIDER_SITE_OTHER): Payer: Medicare Other | Admitting: Internal Medicine

## 2017-06-04 ENCOUNTER — Encounter: Payer: Self-pay | Admitting: Internal Medicine

## 2017-06-04 VITALS — BP 114/66 | HR 71 | Temp 97.6°F | Resp 15 | Ht 67.0 in | Wt 191.4 lb

## 2017-06-04 DIAGNOSIS — Z86010 Personal history of colonic polyps: Secondary | ICD-10-CM

## 2017-06-04 DIAGNOSIS — Z0001 Encounter for general adult medical examination with abnormal findings: Secondary | ICD-10-CM | POA: Diagnosis not present

## 2017-06-04 DIAGNOSIS — R5383 Other fatigue: Secondary | ICD-10-CM | POA: Diagnosis not present

## 2017-06-04 DIAGNOSIS — E785 Hyperlipidemia, unspecified: Secondary | ICD-10-CM | POA: Diagnosis not present

## 2017-06-04 DIAGNOSIS — Z0000 Encounter for general adult medical examination without abnormal findings: Secondary | ICD-10-CM

## 2017-06-04 LAB — CBC WITH DIFFERENTIAL/PLATELET
Basophils Absolute: 0.1 10*3/uL (ref 0.0–0.1)
Basophils Relative: 0.7 % (ref 0.0–3.0)
EOS ABS: 0.1 10*3/uL (ref 0.0–0.7)
Eosinophils Relative: 1.8 % (ref 0.0–5.0)
HCT: 43.8 % (ref 39.0–52.0)
HEMOGLOBIN: 14.5 g/dL (ref 13.0–17.0)
LYMPHS ABS: 1.4 10*3/uL (ref 0.7–4.0)
Lymphocytes Relative: 18.4 % (ref 12.0–46.0)
MCHC: 33.2 g/dL (ref 30.0–36.0)
MCV: 95.8 fl (ref 78.0–100.0)
Monocytes Absolute: 0.7 10*3/uL (ref 0.1–1.0)
Monocytes Relative: 9.1 % (ref 3.0–12.0)
NEUTROS PCT: 70 % (ref 43.0–77.0)
Neutro Abs: 5.3 10*3/uL (ref 1.4–7.7)
Platelets: 228 10*3/uL (ref 150.0–400.0)
RBC: 4.57 Mil/uL (ref 4.22–5.81)
RDW: 13.1 % (ref 11.5–15.5)
WBC: 7.5 10*3/uL (ref 4.0–10.5)

## 2017-06-04 LAB — COMPREHENSIVE METABOLIC PANEL
ALT: 18 U/L (ref 0–53)
AST: 15 U/L (ref 0–37)
Albumin: 4.1 g/dL (ref 3.5–5.2)
Alkaline Phosphatase: 67 U/L (ref 39–117)
BUN: 18 mg/dL (ref 6–23)
CHLORIDE: 103 meq/L (ref 96–112)
CO2: 29 meq/L (ref 19–32)
Calcium: 9.1 mg/dL (ref 8.4–10.5)
Creatinine, Ser: 1.11 mg/dL (ref 0.40–1.50)
GFR: 66.58 mL/min (ref >60.00)
GLUCOSE: 89 mg/dL (ref 70–99)
POTASSIUM: 4.8 meq/L (ref 3.5–5.1)
SODIUM: 136 meq/L (ref 135–145)
Total Bilirubin: 0.5 mg/dL (ref 0.2–1.2)
Total Protein: 6.4 g/dL (ref 6.0–8.3)

## 2017-06-04 LAB — LIPID PANEL
CHOL/HDL RATIO: 3
Cholesterol: 148 mg/dL (ref 0–200)
HDL: 51.2 mg/dL (ref >39.00)
NonHDL: 97.2
Triglycerides: 208 mg/dL — ABNORMAL HIGH (ref 0.0–149.0)
VLDL: 41.6 mg/dL — ABNORMAL HIGH (ref 0.0–40.0)

## 2017-06-04 LAB — LDL CHOLESTEROL, DIRECT: LDL DIRECT: 81 mg/dL

## 2017-06-04 LAB — TSH: TSH: 2.49 u[IU]/mL (ref 0.35–4.50)

## 2017-06-04 MED ORDER — ASPIRIN 81 MG PO TABS: 81.0000 mg | ORAL_TABLET | Freq: Every day | ORAL | 11 refills | Status: DC

## 2017-06-04 MED ORDER — ZOSTER VAC RECOMB ADJUVANTED 50 MCG/0.5ML IM SUSR: 0.5000 mL | Freq: Once | INTRAMUSCULAR | 1 refills | Status: AC

## 2017-06-04 NOTE — Patient Instructions (Addendum)
I encourage you to start a regular exercise program.  Goal 30 minutes 5 days  Per week     The ShingRx vaccine is now available in local pharmacies and is much more protective than Zostavaxs,  It is therefore ADVISED for all interested adults over 50 to prevent shingles   Health Maintenance, Male A healthy lifestyle and preventive care is important for your health and wellness. Ask your health care provider about what schedule of regular examinations is right for you. What should I know about weight and diet? Eat a Healthy Diet  Eat plenty of vegetables, fruits, whole grains, low-fat dairy products, and lean protein.  Do not eat a lot of foods high in solid fats, added sugars, or salt.  Maintain a Healthy Weight Regular exercise can help you achieve or maintain a healthy weight. You should:  Do at least 150 minutes of exercise each week. The exercise should increase your heart rate and make you sweat (moderate-intensity exercise).  Do strength-training exercises at least twice a week.  Watch Your Levels of Cholesterol and Blood Lipids  Have your blood tested for lipids and cholesterol every 5 years starting at 81 years of age. If you are at high risk for heart disease, you should start having your blood tested when you are 81 years old. You may need to have your cholesterol levels checked more often if: ? Your lipid or cholesterol levels are high. ? You are older than 81 years of age. ? You are at high risk for heart disease.  What should I know about cancer screening? Many types of cancers can be detected early and may often be prevented. Lung Cancer  You should be screened every year for lung cancer if: ? You are a current smoker who has smoked for at least 30 years. ? You are a former smoker who has quit within the past 15 years.  Talk to your health care provider about your screening options, when you should start screening, and how often you should be screened.  Colorectal  Cancer  Routine colorectal cancer screening usually begins at 81 years of age and should be repeated every 5-10 years until you are 81 years old. You may need to be screened more often if early forms of precancerous polyps or small growths are found. Your health care provider may recommend screening at an earlier age if you have risk factors for colon cancer.  Your health care provider may recommend using home test kits to check for hidden blood in the stool.  A small camera at the end of a tube can be used to examine your colon (sigmoidoscopy or colonoscopy). This checks for the earliest forms of colorectal cancer.  Prostate and Testicular Cancer  Depending on your age and overall health, your health care provider may do certain tests to screen for prostate and testicular cancer.  Talk to your health care provider about any symptoms or concerns you have about testicular or prostate cancer.  Skin Cancer  Check your skin from head to toe regularly.  Tell your health care provider about any new moles or changes in moles, especially if: ? There is a change in a mole's size, shape, or color. ? You have a mole that is larger than a pencil eraser.  Always use sunscreen. Apply sunscreen liberally and repeat throughout the day.  Protect yourself by wearing long sleeves, pants, a wide-brimmed hat, and sunglasses when outside.  What should I know about heart disease, diabetes, and  high blood pressure?  If you are 17-37 years of age, have your blood pressure checked every 3-5 years. If you are 32 years of age or older, have your blood pressure checked every year. You should have your blood pressure measured twice-once when you are at a hospital or clinic, and once when you are not at a hospital or clinic. Record the average of the two measurements. To check your blood pressure when you are not at a hospital or clinic, you can use: ? An automated blood pressure machine at a pharmacy. ? A home blood  pressure monitor.  Talk to your health care provider about your target blood pressure.  If you are between 33-39 years old, ask your health care provider if you should take aspirin to prevent heart disease.  Have regular diabetes screenings by checking your fasting blood sugar level. ? If you are at a normal weight and have a low risk for diabetes, have this test once every three years after the age of 80. ? If you are overweight and have a high risk for diabetes, consider being tested at a younger age or more often.  A one-time screening for abdominal aortic aneurysm (AAA) by ultrasound is recommended for men aged 85-75 years who are current or former smokers. What should I know about preventing infection? Hepatitis B If you have a higher risk for hepatitis B, you should be screened for this virus. Talk with your health care provider to find out if you are at risk for hepatitis B infection. Hepatitis C Blood testing is recommended for:  Everyone born from [birth year] through 1963.  Anyone with known risk factors for hepatitis C.  Sexually Transmitted Diseases (STDs)  You should be screened each year for STDs including gonorrhea and chlamydia if: ? You are sexually active and are younger than 81 years of age. ? You are older than 81 years of age and your health care provider tells you that you are at risk for this type of infection. ? Your sexual activity has changed since you were last screened and you are at an increased risk for chlamydia or gonorrhea. Ask your health care provider if you are at risk.  Talk with your health care provider about whether you are at high risk of being infected with HIV. Your health care provider may recommend a prescription medicine to help prevent HIV infection.  What else can I do?  Schedule regular health, dental, and eye exams.  Stay current with your vaccines (immunizations).  Do not use any tobacco products, such as cigarettes, chewing tobacco, and  e-cigarettes. If you need help quitting, ask your health care provider.  Limit alcohol intake to no more than 2 drinks per day. One drink equals 12 ounces of beer, 5 ounces of wine, or 1 ounces of hard liquor.  Do not use street drugs.  Do not share needles.  Ask your health care provider for help if you need support or information about quitting drugs.  Tell your health care provider if you often feel depressed.  Tell your health care provider if you have ever been abused or do not feel safe at home. This information is not intended to replace advice given to you by your health care provider. Make sure you discuss any questions you have with your health care provider. Document Released: 05/01/2008 Document Revised: 07/02/2016 Document Reviewed: 08/07/2015 Elsevier Interactive Patient Education  [employer].

## 2017-06-04 NOTE — Progress Notes (Signed)
Patient ID: Jon Sek., male    DOB: October 06, 1928  Age: 83 y.o. MRN: [id number]  The patient is here for annual preventive examination and management of other chronic and acute problems.  Last seen April 2016.  Discussed last colonoscopy in 2014 with 10 tubular adenomas found .  Has repeat OV with GI coming up.  Undecided  About whether to have a colonoscopy  Macular degeneration ,  Not progressing per [place]  Hearing screen excellent Sleeps well most nights  One nocturnal void  Had shingles right parietal area while vacationing in [place]  Several yars ago; disucssed Shingrix vaccine    The risk factors are reflected in the social history.  The roster of all physicians providing medical care to patient - is listed in the Snapshot section of the chart.  Activities of daily living:  The patient is 100% independent in all ADLs: dressing, toileting, feeding as well as independent mobility  Home safety : The patient has smoke detectors in the home. They wear seatbelts.  There are no firearms at home. There is no violence in the home.   There is no risks for hepatitis, STDs or HIV. There is no   history of blood transfusion. They have no travel history to infectious disease endemic areas of the world.  The patient has seen their dentist in the last six month. They have seen their eye doctor in the last year. They admit to slight hearing difficulty with regard to whispered voices and some television programs.  They have deferred audiologic testing in the last year.  They do not  have excessive sun exposure. Discussed the need for sun protection: hats, long sleeves and use of sunscreen if there is significant sun exposure.   Diet: the importance of a healthy diet is discussed. They do have a healthy diet.  The benefits of regular aerobic exercise were discussed. he walks 1 or 2  times per week ,  20 minutes.  Walks to most places at [facility] and unless on an incline he  denies shortness  of breath,  Chest pain,  Joint pain,  Sedentary lifestyle .  Has some plantar tenderness  Aided by gel inserts   Depression screen: there are no signs or vegative symptoms of depression- irritability, change in appetite, anhedonia, sadness/tearfullness.  Cognitive assessment: the patient manages all their financial and personal affairs and is actively engaged. They could relate day,date,year and events; recalled 2/3 objects at 3 minutes; performed clock-face test normally.  The following portions of the patient's history were reviewed and updated as appropriate: allergies, current medications, past family history, past medical history,  past surgical history, past social history  and problem list.  Visual acuity was not assessed per patient preference since she has regular follow up with her ophthalmologist. Hearing and body mass index were assessed and reviewed.   During the course of the visit the patient was educated and counseled about appropriate screening and preventive services including : fall prevention , diabetes screening, nutrition counseling, colorectal cancer screening, and recommended immunizations.    CC: The primary encounter diagnosis was Fatigue, unspecified type. Diagnoses of Hyperlipidemia LDL goal <130, Routine general medical examination at a health care facility, and Personal history of colonic polyps were also pertinent to this visit.  History Graeson has a past medical history of Cancer ([place]) (1965); Infectious colitis (2000); and Rosacea.   He has a past surgical history that includes Melanoma excision with sentinel lymph node dissection and Soft Tissue  Tumor Resection (1985).   His family history includes Heart failure in his father and mother; Mental illness in his mother; Rheumatic fever in his sister.He reports that he quit smoking about 55 years ago. His smoking use included Cigarettes. He has never used smokeless tobacco. He reports that he drinks about 4.2 oz of  alcohol per week . He reports that he does not use drugs.  Outpatient Medications Prior to Visit  Medication Sig Dispense Refill  . metroNIDAZOLE (METROCREAM) 0.75 % cream Apply topically.    . Multiple Vitamins-Minerals (PRESERVISION AREDS 2) CAPS Take 1 capsule by mouth 2 (two) times daily.    Marland Kitchen aspirin 81 MG tablet Take 160 mg by mouth daily.     No facility-administered medications prior to visit.     Review of Systems   Patient denies headache, fevers, malaise, unintentional weight loss, skin rash, eye pain, sinus congestion and sinus pain, sore throat, dysphagia,  hemoptysis , cough, dyspnea, wheezing, chest pain, palpitations, orthopnea, edema, abdominal pain, nausea, melena, diarrhea, constipation, flank pain, dysuria, hematuria, urinary  Frequency, nocturia, numbness, tingling, seizures,  Focal weakness, Loss of consciousness,  Tremor, insomnia, depression, anxiety, and suicidal ideation.     Objective:  BP 114/66 (BP Location: Left Arm, Patient Position: Sitting, Cuff Size: Normal)   Pulse 71   Temp 97.6 F (36.4 C) (Oral)   Resp 15   Ht 5\' 7"  (1.702 m)   Wt 191 lb 6.4 oz (86.8 kg)   SpO2 97%   BMI 29.98 kg/m   Physical Exam   General appearance: alert, cooperative and appears stated age Ears: normal TM's and external ear canals both ears Throat: lips, mucosa, and tongue normal; teeth and gums normal Neck: no adenopathy, no carotid bruit, supple, symmetrical, trachea midline and thyroid not enlarged, symmetric, no tenderness/mass/nodules Back: symmetric, no curvature. ROM normal. No CVA tenderness. Lungs: clear to auscultation bilaterally Heart: regular rate and rhythm, S1, S2 normal, no murmur, click, rub or gallop Abdomen: soft, non-tender; bowel sounds normal; no masses,  no organomegaly Pulses: 2+ and symmetric Skin: Skin color, texture, turgor normal. No rashes or lesions Lymph nodes: Cervical, supraclavicular, and axillary nodes normal.    Assessment &  Plan:   Problem List Items Addressed This Visit    Fatigue - Primary   Relevant Orders   Comprehensive metabolic panel (Completed)   TSH (Completed)   CBC with Differential/Platelet (Completed)   Personal history of colonic polyps    Over ten polyps found on 2014 colonoscopy. Deferred 3 yr follow up in 2017 .He has been reminded by [facility] about follow up on colonic polyps.  Discussed the risks and benefits of continued screening at his age       Routine general medical examination at a health care facility    Annual comprehensive preventive exam was done as well as an evaluation and management of acute and chronic conditions .  During the course of the visit the patient was educated and counseled about age appropriate screening and preventive services including :  diabetes screening, lipid analysis  nutrition counseling,  and recommended immunizations.  Printed recommendations for health maintenance screenings was given.        Other Visit Diagnoses    Hyperlipidemia LDL goal <130       Relevant Medications   aspirin 81 MG tablet   Other Relevant Orders   Lipid panel (Completed)      I have changed Mr. Sova's aspirin. I am also having him start  on Zoster Vac Recomb Adjuvanted. Additionally, I am having him maintain his PRESERVISION AREDS 2 and metroNIDAZOLE.  Meds ordered this encounter  Medications  . aspirin 81 MG tablet    Sig: Take 1 tablet (81 mg total) by mouth daily.    Dispense:  30 tablet    Refill:  11  . Zoster Vac Recomb Adjuvanted (SHINGRIX) injection    Sig: Inject 0.5 mLs into the muscle once.    Dispense:  0.5 mL    Refill:  1    Medications Discontinued During This Encounter  Medication Reason  . aspirin 81 MG tablet Reorder    Follow-up: No Follow-up on file.   Crecencio Mc, MD

## 2017-06-06 NOTE — Assessment & Plan Note (Addendum)
Over ten polyps found on 2014 colonoscopy. Deferred 3 yr follow up in 2017 .He has been reminded by [facility] about follow up on colonic polyps.  Discussed the risks and benefits of continued screening at his age

## 2017-06-06 NOTE — Assessment & Plan Note (Signed)
Annual comprehensive preventive exam was done as well as an evaluation and management of acute and chronic conditions .  During the course of the visit the patient was educated and counseled about age appropriate screening and preventive services including :  diabetes screening, lipid analysis  nutrition counseling,  and recommended immunizations.  Printed recommendations for health maintenance screenings was given.

## 2017-08-12 NOTE — Telephone Encounter (Signed)
Orders

## 2018-02-18 ENCOUNTER — Telehealth: Payer: Self-pay | Admitting: Internal Medicine

## 2018-02-18 ENCOUNTER — Ambulatory Visit (INDEPENDENT_AMBULATORY_CARE_PROVIDER_SITE_OTHER): Payer: Medicare Other

## 2018-02-18 ENCOUNTER — Encounter: Payer: Self-pay | Admitting: Internal Medicine

## 2018-02-18 ENCOUNTER — Ambulatory Visit: Payer: Medicare Other | Admitting: Internal Medicine

## 2018-02-18 VITALS — BP 126/64 | HR 103 | Temp 98.4°F | Resp 18 | Ht 67.0 in | Wt 186.1 lb

## 2018-02-18 DIAGNOSIS — R509 Fever, unspecified: Secondary | ICD-10-CM | POA: Diagnosis not present

## 2018-02-18 DIAGNOSIS — R059 Cough, unspecified: Secondary | ICD-10-CM

## 2018-02-18 DIAGNOSIS — J111 Influenza due to unidentified influenza virus with other respiratory manifestations: Secondary | ICD-10-CM | POA: Diagnosis not present

## 2018-02-18 DIAGNOSIS — R05 Cough: Secondary | ICD-10-CM

## 2018-02-18 DIAGNOSIS — R000 Tachycardia, unspecified: Secondary | ICD-10-CM | POA: Diagnosis not present

## 2018-02-18 HISTORY — DX: Influenza due to unidentified influenza virus with other respiratory manifestations: J11.1

## 2018-02-18 LAB — POC INFLUENZA A&B (BINAX/QUICKVUE)
INFLUENZA A, POC: POSITIVE — AB
INFLUENZA B, POC: POSITIVE — AB

## 2018-02-18 MED ORDER — OSELTAMIVIR PHOSPHATE 75 MG PO CAPS: 75.0000 mg | ORAL_CAPSULE | Freq: Two times a day (BID) | ORAL | 0 refills | Status: DC

## 2018-02-18 MED ORDER — DOXYCYCLINE HYCLATE 100 MG PO TABS
100.0000 mg | ORAL_TABLET | Freq: Two times a day (BID) | ORAL | 0 refills | Status: DC
Start: 2018-02-18 — End: 2018-03-19

## 2018-02-18 NOTE — Telephone Encounter (Signed)
Pt is scheduled for 1:00pm today with Dr. Aundra Dubin. Pt's wife is aware of appt date and time.

## 2018-02-18 NOTE — Telephone Encounter (Signed)
Please advise 

## 2018-02-18 NOTE — Patient Instructions (Addendum)
You were given a Duoneb breathing treatment today  Doxycycline 100 mg 2x per day take with food  Tamiflu 75 mg bid 2x per day x 5 days F/u in 1 week to 10 days sooner if not better  Try Mucinex DM green label for cough  Increase hydration and get rest  Take care  I hope you feel better    Influenza, Adult Influenza, more commonly known as "the flu," is a viral infection that primarily affects the respiratory tract. The respiratory tract includes organs that help you breathe, such as the lungs, nose, and throat. The flu causes many common cold symptoms, as well as a high fever and body aches. The flu spreads easily from person to person (is contagious). Getting a flu shot (influenza vaccination) every year is the best way to prevent influenza. What are the causes? Influenza is caused by a virus. You can catch the virus by:  Breathing in droplets from an infected person's cough or sneeze.  Touching something that was recently contaminated with the virus and then touching your mouth, nose, or eyes.  What increases the risk? The following factors may make you more likely to get the flu:  Not cleaning your hands frequently with soap and water or alcohol-based hand sanitizer.  Having close contact with many people during cold and flu season.  Touching your mouth, eyes, or nose without washing or sanitizing your hands first.  Not drinking enough fluids or not eating a healthy diet.  Not getting enough sleep or exercise.  Being under a high amount of stress.  Not getting a yearly (annual) flu shot.  You may be at a higher risk of complications from the flu, such as a severe lung infection (pneumonia), if you:  Are over the age of 20.  Are pregnant.  Have a weakened disease-fighting system (immune system). You may have a weakened immune system if you: ? Have HIV or AIDS. ? Are undergoing chemotherapy. ? Aretaking medicines that reduce the activity of (suppress) the immune  system.  Have a long-term (chronic) illness, such as heart disease, kidney disease, diabetes, or lung disease.  Have a liver disorder.  Are obese.  Have anemia.  What are the signs or symptoms? Symptoms of this condition typically last 4-10 days and may include:  Fever.  Chills.  Headache, body aches, or muscle aches.  Sore throat.  Cough.  Runny or congested nose.  Chest discomfort and cough.  Poor appetite.  Weakness or tiredness (fatigue).  Dizziness.  Nausea or vomiting.  How is this diagnosed? This condition may be diagnosed based on your medical history and a physical exam. Your health care provider may do a nose or throat swab test to confirm the diagnosis. How is this treated? If influenza is detected early, you can be treated with antiviral medicine that can reduce the length of your illness and the severity of your symptoms. This medicine may be given by mouth (orally) or through an IV tube that is inserted in one of your veins. The goal of treatment is to relieve symptoms by taking care of yourself at home. This may include taking over-the-counter medicines, drinking plenty of fluids, and adding humidity to the air in your home. In some cases, influenza goes away on its own. Severe influenza or complications from influenza may be treated in a hospital. Follow these instructions at home:  Take over-the-counter and prescription medicines only as told by your health care provider.  Use a cool mist humidifier  to add humidity to the air in your home. This can make breathing easier.  Rest as needed.  Drink enough fluid to keep your urine clear or pale yellow.  Cover your mouth and nose when you cough or sneeze.  Wash your hands with soap and water often, especially after you cough or sneeze. If soap and water are not available, use hand sanitizer.  Stay home from work or school as told by your health care provider. Unless you are visiting your health care  provider, try to avoid leaving home until your fever has been gone for 24 hours without the use of medicine.  Keep all follow-up visits as told by your health care provider. This is important. How is this prevented?  Getting an annual flu shot is the best way to avoid getting the flu. You may get the flu shot in late summer, fall, or winter. Ask your health care provider when you should get your flu shot.  Wash your hands often or use hand sanitizer often.  Avoid contact with people who are sick during cold and flu season.  Eat a healthy diet, drink plenty of fluids, get enough sleep, and exercise regularly. Contact a health care provider if:  You develop new symptoms.  You have: ? Chest pain. ? Diarrhea. ? A fever.  Your cough gets worse.  You produce more mucus.  You feel nauseous or you vomit. Get help right away if:  You develop shortness of breath or difficulty breathing.  Your skin or nails turn a bluish color.  You have severe pain or stiffness in your neck.  You develop a sudden headache or sudden pain in your face or ear.  You cannot stop vomiting. This information is not intended to replace advice given to you by your health care provider. Make sure you discuss any questions you have with your health care provider. Document Released: 10/31/2000 Document Revised: 04/10/2016 Document Reviewed: 08/28/2015 Elsevier Interactive Patient Education  2017 Elsevier Inc.   Cough, Adult Coughing is a reflex that clears your throat and your airways. Coughing helps to heal and protect your lungs. It is normal to cough occasionally, but a cough that happens with other symptoms or lasts a long time may be a sign of a condition that needs treatment. A cough may last only 2-3 weeks (acute), or it may last longer than 8 weeks (chronic). What are the causes? Coughing is commonly caused by:  Breathing in substances that irritate your lungs.  A viral or bacterial respiratory  infection.  Allergies.  Asthma.  Postnasal drip.  Smoking.  Acid backing up from the stomach into the esophagus (gastroesophageal reflux).  Certain medicines.  Chronic lung problems, including COPD (or rarely, lung cancer).  Other medical conditions such as heart failure.  Follow these instructions at home: Pay attention to any changes in your symptoms. Take these actions to help with your discomfort:  Take medicines only as told by your health care provider. ? If you were prescribed an antibiotic medicine, take it as told by your health care provider. Do not stop taking the antibiotic even if you start to feel better. ? Talk with your health care provider before you take a cough suppressant medicine.  Drink enough fluid to keep your urine clear or pale yellow.  If the air is dry, use a cold steam vaporizer or humidifier in your bedroom or your home to help loosen secretions.  Avoid anything that causes you to cough at work or  at home.  If your cough is worse at night, try sleeping in a semi-upright position.  Avoid cigarette smoke. If you smoke, quit smoking. If you need help quitting, ask your health care provider.  Avoid caffeine.  Avoid alcohol.  Rest as needed.  Contact a health care provider if:  You have new symptoms.  You cough up pus.  Your cough does not get better after 2-3 weeks, or your cough gets worse.  You cannot control your cough with suppressant medicines and you are losing sleep.  You develop pain that is getting worse or pain that is not controlled with pain medicines.  You have a fever.  You have unexplained weight loss.  You have night sweats. Get help right away if:  You cough up blood.  You have difficulty breathing.  Your heartbeat is very fast. This information is not intended to replace advice given to you by your health care provider. Make sure you discuss any questions you have with your health care provider. Document  Released: 05/02/2011 Document Revised: 04/10/2016 Document Reviewed: 01/10/2015 Elsevier Interactive Patient Education  [employer].

## 2018-02-18 NOTE — Telephone Encounter (Signed)
Pt's wife came in and stated that they had a death in the family. Pt is sick, [facility] has a room available for short stay. [facility] needs a doctor's note stating it is ok. Please advise pt wife. Pt hasn't not been seen since 7/18 and hasn't seen anyone since he has been sick

## 2018-02-18 NOTE — Telephone Encounter (Signed)
I cannot write a letter stating that a patient is sick if I have not seen him.  Can Jon Sherman see him today?

## 2018-02-18 NOTE — Progress Notes (Signed)
Pre-visit discussion using our clinic review tool. No additional management support is needed unless otherwise documented below in the visit note.  

## 2018-02-18 NOTE — Progress Notes (Signed)
Chief Complaint  Patient presents with  . Headache  . Cough    Productive green mucus x 4 days   Sick visit  C/o sick since [place] started with GI bug 2 weeks ago then noticed eye discharge earlier in the week saw Dr. Arley [place] and was given op eye drops dx'ed with bacterial infection. No sick contacts. He has had cough with green mucous, runny nose, sore throat from coughing, feeling weak, generalized abdominal pain with or without coughing. He had a fever 100.5 yesterday when [facility] checked and took Tylenol this am. Denies sob.   Wife wants letter so he can go to the Health care unit at [facility] while she leaves x 1 day to go to a funeral in the [place] of the [place]. Letter given    Review of Systems  Constitutional: Positive for malaise/fatigue.       Down 5lbs since last   HENT: Positive for sore throat.   Eyes: Positive for discharge. Negative for blurred vision.  Respiratory: Positive for cough and sputum production. Negative for shortness of breath.   Cardiovascular: Negative for chest pain.  Gastrointestinal: Positive for abdominal pain.  Musculoskeletal: Positive for myalgias.  Neurological: Negative for headaches.   Past Medical History:  Diagnosis Date  . Cancer ([place]) 1965   melanoma, LNs resected  . Infectious colitis 2000   requiring hospitalization  . Rosacea    Past Surgical History:  Procedure Laterality Date  . MELANOMA EXCISION WITH SENTINEL LYMPH NODE BIOPSY    . SOFT TISSUE TUMOR RESECTION  1985   Family History  Problem Relation Age of Onset  . Mental illness Mother   . Heart failure Mother   . Heart failure Father   . Rheumatic fever Sister   . Cancer Neg Hx   . Drug abuse Neg Hx    Social History   Socioeconomic History  . Marital status: Married    Spouse name: Not on file  . Number of children: Not on file  . Years of education: Not on file  . Highest education level: Not on file  Occupational History  . Not on file   Social Needs  . Financial resource strain: Not on file  . Food insecurity:    Worry: Not on file    Inability: Not on file  . Transportation needs:    Medical: Not on file    Non-medical: Not on file  Tobacco Use  . Smoking status: Former Smoker    Types: Cigarettes    Last attempt to quit: 02/22/1962    Years since quitting: 56.0  . Smokeless tobacco: Never Used  Substance and Sexual Activity  . Alcohol use: Yes    Alcohol/week: 4.2 oz    Types: 7 Standard drinks or equivalent per week    Comment: Occasional wine and beer with meal   . Drug use: No  . Sexual activity: Never  Lifestyle  . Physical activity:    Days per week: Not on file    Minutes per session: Not on file  . Stress: Not on file  Relationships  . Social connections:    Talks on phone: Not on file    Gets together: Not on file    Attends religious service: Not on file    Active member of club or organization: Not on file    Attends meetings of clubs or organizations: Not on file    Relationship status: Not on file  . Intimate partner  violence:    Fear of current or ex partner: Not on file    Emotionally abused: Not on file    Physically abused: Not on file    Forced sexual activity: Not on file  Other Topics Concern  . Not on file  Social History Narrative   Married    Lives in [place] >10 years as of 02/2018    Current Meds  Medication Sig  . aspirin 81 MG tablet Take 1 tablet (81 mg total) by mouth daily.  . metroNIDAZOLE (METROCREAM) 0.75 % cream Apply topically.  . Multiple Vitamins-Minerals (PRESERVISION AREDS 2) CAPS Take 1 capsule by mouth 2 (two) times daily.  Marland Kitchen neomycin-polymyxin b-dexamethasone (MAXITROL) 3.5-10000-0.1 SUSP Place 1 drop into both eyes 4 (four) times daily.   Allergies  Allergen Reactions  . Demerol     Caused patient to feel nauseated  . Meperidine     Other reaction(s): Unknown   No results found for this or any previous visit (from the past 2160  hour(s)). Objective  Body mass index is 29.15 kg/m. Wt Readings from Last 3 Encounters:  02/18/18 186 lb 2 oz (84.4 kg)  06/04/17 191 lb 6.4 oz (86.8 kg)  03/18/17 193 lb 6.4 oz (87.7 kg)   Temp Readings from Last 3 Encounters:  02/18/18 98.4 F (36.9 C) (Oral)  06/04/17 97.6 F (36.4 C) (Oral)  03/18/17 97.9 F (36.6 C) (Oral)   BP Readings from Last 3 Encounters:  02/18/18 126/64  06/04/17 114/66  03/18/17 130/64   Pulse Readings from Last 3 Encounters:  02/18/18 (!) 103  06/04/17 71  03/18/17 62    Physical Exam  Constitutional: He is oriented to person, place, and time. He appears not lethargic. He has a sickly appearance. No distress.  Appears ill   HENT:  Head: Normocephalic and atraumatic.  Eyes: Pupils are equal, round, and reactive to light. Conjunctivae are normal. Left eye exhibits discharge.  Cardiovascular: Regular rhythm and normal heart sounds. Tachycardia present.  Pulmonary/Chest: Effort normal.  Mild course breath sounds throughout  Neurological: He is alert and oriented to person, place, and time. He appears not lethargic. Gait normal. Gait normal.  Skin: Skin is warm, dry and intact.  Psychiatric: Mood, memory, affect and judgment normal.  Nursing note and vitals reviewed.   Assessment   1. Cough, h/o fever, tachycardia r/o pneumonia today. Flu A+/Flu B+ Plan  1.  CXR today  Doxy bid x 1 week, Tamiflu 75 bid x 5 days also empirically tx'ed his wife see note  duoneb x 1 Letter written so he can be in health care unit at [facility] while wife goes away 1 day for funeral  F/u in 7-10 days  Call pt [phone] with CXR results   Had flu shot 08/2017  Consider update pna 23 in future  Had prevnar  shingrix had 1/2 02/11/18  Tdap 02/22/13  Provider: Dr. Olivia Mackie McLean-Scocuzza-Internal Medicine

## 2018-02-19 DIAGNOSIS — R05 Cough: Secondary | ICD-10-CM | POA: Diagnosis not present

## 2018-02-19 MED ORDER — IPRATROPIUM-ALBUTEROL 0.5-2.5 (3) MG/3ML IN SOLN
3.0000 mL | Freq: Four times a day (QID) | RESPIRATORY_TRACT | Status: DC
  Administered 2018-02-19: 3 mL via RESPIRATORY_TRACT

## 2018-02-19 NOTE — Addendum Note (Signed)
Addended by: Elpidio Galea T on: 02/19/2018 09:18 AM   Modules accepted: Orders

## 2018-03-19 ENCOUNTER — Ambulatory Visit (INDEPENDENT_AMBULATORY_CARE_PROVIDER_SITE_OTHER): Payer: Medicare Other

## 2018-03-19 VITALS — BP 112/68 | HR 79 | Temp 98.5°F | Resp 14 | Ht 67.0 in | Wt 188.4 lb

## 2018-03-19 DIAGNOSIS — Z0000 Encounter for general adult medical examination without abnormal findings: Secondary | ICD-10-CM

## 2018-03-19 NOTE — Progress Notes (Signed)
Subjective:   Jon Sherman. is a 84 y.o. male who presents for Medicare Annual/Subsequent preventive examination.  Review of Systems:  No ROS.  Medicare Wellness Visit.  Additional risk factors are reflected in the social history. Cardiac Risk Factors include: advanced age (>49men, >74 women);male gender     Objective:    Vitals: BP 112/68 (BP Location: Left Arm, Patient Position: Sitting, Cuff Size: Normal)   Pulse 79   Temp 98.5 F (36.9 C) (Oral)   Resp 14   Ht 5\' 7"  (1.702 m)   Wt 188 lb 6.4 oz (85.5 kg)   SpO2 95%   BMI 29.51 kg/m   Body mass index is 29.51 kg/m.  Advanced Directives 03/19/2018 03/18/2017 03/05/2016  Does Patient Have a Medical Advance Directive? Yes Yes Yes  Type of Paramedic of [place];Living will [place];Living will [place];Living will  Does patient want to make changes to medical advance directive? No - Patient declined No - Patient declined -  Copy of [place] in Chart? No - copy requested No - copy requested No - copy requested    Tobacco Social History   Tobacco Use  Smoking Status Former Smoker  . Types: Cigarettes  . Last attempt to quit: 02/22/1962  . Years since quitting: 56.1  Smokeless Tobacco Never Used     Counseling given: Not Answered   Clinical Intake:  Pre-visit preparation completed: Yes  Pain : No/denies pain     Nutritional Status: BMI > 30  Obese Diabetes: No  How often do you need to have someone help you when you read instructions, pamphlets, or other written materials from your doctor or pharmacy?: 1 - Never  Interpreter Needed?: No     Past Medical History:  Diagnosis Date  . Cancer ([place]) 1965   melanoma, LNs resected  . Infectious colitis 2000   requiring hospitalization  . Rosacea    Past Surgical History:  Procedure Laterality Date  . MELANOMA EXCISION WITH SENTINEL LYMPH NODE BIOPSY    . SOFT TISSUE TUMOR  RESECTION  1985   Family History  Problem Relation Age of Onset  . Mental illness Mother   . Heart failure Mother   . Heart failure Father   . Rheumatic fever Sister   . Cancer Neg Hx   . Drug abuse Neg Hx    Social History   Socioeconomic History  . Marital status: Married    Spouse name: Not on file  . Number of children: Not on file  . Years of education: Not on file  . Highest education level: Not on file  Occupational History  . Not on file  Social Needs  . Financial resource strain: Not hard at all  . Food insecurity:    Worry: Never true    Inability: Never true  . Transportation needs:    Medical: No    Non-medical: No  Tobacco Use  . Smoking status: Former Smoker    Types: Cigarettes    Last attempt to quit: 02/22/1962    Years since quitting: 56.1  . Smokeless tobacco: Never Used  Substance and Sexual Activity  . Alcohol use: Yes    Alcohol/week: 4.2 oz    Types: 7 Standard drinks or equivalent per week    Comment: Occasional wine and beer with meal   . Drug use: No  . Sexual activity: Never  Lifestyle  . Physical activity:    Days per  week: Not on file    Minutes per session: Not on file  . Stress: Not on file  Relationships  . Social connections:    Talks on phone: Not on file    Gets together: Not on file    Attends religious service: Not on file    Active member of club or organization: Not on file    Attends meetings of clubs or organizations: Not on file    Relationship status: Not on file  Other Topics Concern  . Not on file  Social History Narrative   Married    Lives in [place] >10 years as of 02/2018     Outpatient Encounter Medications as of 03/19/2018  Medication Sig  . aspirin 81 MG tablet Take 1 tablet (81 mg total) by mouth daily.  . metroNIDAZOLE (METROCREAM) 0.75 % cream Apply topically.  . Multiple Vitamins-Minerals (PRESERVISION AREDS 2) CAPS Take 1 capsule by mouth 2 (two) times daily.  . [DISCONTINUED] doxycycline  (VIBRA-TABS) 100 MG tablet Take 1 tablet (100 mg total) by mouth 2 (two) times daily. With food  . [DISCONTINUED] neomycin-polymyxin b-dexamethasone (MAXITROL) 3.5-10000-0.1 SUSP Place 1 drop into both eyes 4 (four) times daily.  . [DISCONTINUED] oseltamivir (TAMIFLU) 75 MG capsule Take 1 capsule (75 mg total) by mouth 2 (two) times daily.   Facility-Administered Encounter Medications as of 03/19/2018  Medication  . ipratropium-albuterol (DUONEB) 0.5-2.5 (3) MG/3ML nebulizer solution 3 mL    Activities of Daily Living In your present state of health, do you have any difficulty performing the following activities: 03/19/2018  Hearing? N  Vision? N  Difficulty concentrating or making decisions? N  Walking or climbing stairs? N  Dressing or bathing? N  Doing errands, shopping? N  Preparing Food and eating ? N  Using the Toilet? N  In the past six months, have you accidently leaked urine? N  Do you have problems with loss of bowel control? N  Managing your Medications? N  Managing your Finances? N  Housekeeping or managing your Housekeeping? N  Some recent data might be hidden    Patient Care Team: Crecencio Mc, MD as PCP - General (Internal Medicine) Crecencio Mc, MD (Internal Medicine)   Assessment:   This is a routine wellness examination for [name].  The goal of the wellness visit is to assist the patient how to close the gaps in care and create a preventative care plan for the patient.   The roster of all physicians providing medical care to patient is listed in the Snapshot section of the chart.  Taking calcium VIT D as appropriate/Osteoporosis risk reviewed.    Safety issues reviewed; Smoke and carbon monoxide detectors in the home. No firearms in the home. Wears seatbelts when driving or riding with others. No violence in the home.  They do not have excessive sun exposure.  Discussed the need for sun protection: hats, long sleeves and the use of sunscreen if there is  significant sun exposure.  Patient is alert, normal appearance, oriented to person/place/and time.  Correctly identified the president of the [place] and recalls of 5/5 words. Performs simple calculations and can read correct time from watch face. Displays appropriate judgement.  No new identified risk were noted.  No failures at ADL's or IADL's.    BMI- discussed the importance of a healthy diet, water intake and the benefits of aerobic exercise. Educational material provided.   24 hour diet recall: Regular diet  Dental- every 6 months.  Eye- Visual acuity not assessed per patient preference since they have regular follow up with the ophthalmologist.  Wears corrective lenses.  Sleep patterns- Sleeps without issues.    Health maintenance gaps- closed.  Patient Concerns: None at this time. Follow up with PCP as needed.  Exercise Activities and Dietary recommendations Current Exercise Habits: Home exercise routine, Type of exercise: walking, Time (Minutes): 15, Frequency (Times/Week): 2, Weekly Exercise (Minutes/Week): 30  Goals    . Increase physical activity     Participate in the model railroad club  Walk for exercise, as tolerated        Fall Risk Fall Risk  03/19/2018 03/18/2017 03/05/2016 02/27/2015 02/26/2015  Falls in the past year? No No No No No   Depression Screen PHQ 2/9 Scores 03/19/2018 03/18/2017 03/05/2016 02/27/2015  PHQ - 2 Score 0 0 0 0  PHQ- 9 Score - 0 - -    Cognitive Function MMSE - Mini Mental State Exam 03/05/2016  Orientation to time 5  Orientation to Place 5  Registration 3  Attention/ Calculation 5  Recall 3  Language- name 2 objects 2  Language- repeat 1  Language- follow 3 step command 3  Language- read & follow direction 1  Write a sentence 1  Copy design 1  Total score 30     6CIT Screen 03/19/2018 03/18/2017  What Year? 0 points 0 points  What month? 0 points 0 points  What time? 0 points 0 points  Count back from 20 0 points 0 points    Months in reverse 0 points 0 points  Repeat phrase 0 points 0 points  Total Score 0 0    Immunization History  Administered Date(s) Administered  . Influenza Split 09/12/2013  . Influenza-Unspecified 09/17/2012, 09/15/2014  . Pneumococcal Conjugate-13 11/20/2014  . Pneumococcal Polysaccharide-23 11/20/2005  . Tdap 02/22/2013  . Zoster Recombinat (Shingrix) 02/11/2018   Screening Tests Health Maintenance  Topic Date Due  . INFLUENZA VACCINE  06/17/2018  . TETANUS/TDAP  02/23/2023  . PNA vac Low Risk Adult  Completed      Plan:    End of life planning; Advance aging; Advanced directives discussed. Copy of current HCPOA/Living Will requested.    I have personally reviewed and noted the following in the patient's chart:   . Medical and social history . Use of alcohol, tobacco or illicit drugs  . Current medications and supplements . Functional ability and status . Nutritional status . Physical activity . Advanced directives . List of other physicians . Hospitalizations, surgeries, and ER visits in previous 12 months . Vitals . Screenings to include cognitive, depression, and falls . Referrals and appointments  In addition, I have reviewed and discussed with patient certain preventive protocols, quality metrics, and best practice recommendations. A written personalized care plan for preventive services as well as general preventive health recommendations were provided to patient.     Varney Biles, LPN  05/25/2955

## 2018-03-19 NOTE — Patient Instructions (Addendum)
  Mr. Clauss , Thank you for taking time to come for your Medicare Wellness Visit. I appreciate your ongoing commitment to your health goals. Please review the following plan we discussed and let me know if I can assist you in the future.   Follow up as needed.    Bring a copy of your [place] and/or Living Will to be scanned into chart.  Have a great day!  These are the goals we discussed: Goals    . Increase physical activity     Participate in the model railroad club  Walk for exercise, as tolerated        This is a list of the screening recommended for you and due dates:  Health Maintenance  Topic Date Due  . Flu Shot  06/17/2018  . Tetanus Vaccine  02/23/2023  . Pneumonia vaccines  Completed

## 2018-04-07 ENCOUNTER — Ambulatory Visit: Payer: Self-pay | Admitting: *Deleted

## 2018-04-07 NOTE — Telephone Encounter (Signed)
FYI. Patient has been informed if symptoms get worse to go to ED/ Urgent Care

## 2018-04-07 NOTE — Telephone Encounter (Signed)
Pt's wife called to report pt with cough, LGT of 100.0. Poor appetite. NT spoke with pt who reports onset of cough yesterday, gradually worsening by afternoon. Coughing up "Whitish mucous." States "Just a head cold that takes a few days to go away."  Has taken 'ColdEze' and OTC cough syrup, minimally effective. Pt sounds congested. States intermittent wheezing. Mild shortness of breath evident, speech faltering at times. Pt directed to UC, declines. Wishes to only see provider at [facility]. No available appts today, spoke with Juliann Pulse, appt made for tomorrow at [facility] with M.Inda Castle. Pt directed to UC/ED if SOB, wheezing worsen, elevated temp. Reiterated importance of urgent disposition if symptoms worsen; verbalizes understanding.    Reason for Disposition . Wheezing is present  Answer Assessment - Initial Assessment Questions 1. ONSET: "When did the cough begin?"      Yesterday 2. SEVERITY: "How bad is the cough today?"      Moderate to severe 3. RESPIRATORY DISTRESS: "Describe your breathing."      SOB at times with coughing 4. FEVER: "Do you have a fever?" If so, ask: "What is your temperature, how was it measured, and when did it start?"     100.0 5. SPUTUM: "Describe the color of your sputum" (clear, white, yellow, green)     Whitish 6. HEMOPTYSIS: "Are you coughing up any blood?" If so ask: "How much?" (flecks, streaks, tablespoons, etc.)     no 7. CARDIAC HISTORY: "Do you have any history of heart disease?" (e.g., heart attack, congestive heart failure)      no 8. LUNG HISTORY: "Do you have any history of lung disease?"  (e.g., pulmonary embolus, asthma, emphysema)     no 9. PE RISK FACTORS: "Do you have a history of blood clots?" (or: recent major surgery, recent prolonged travel, bedridden )     no 10. OTHER SYMPTOMS: "Do you have any other symptoms?" (e.g., runny nose, wheezing, chest pain)       Wheezing at times  Protocols used: [place]

## 2018-04-08 ENCOUNTER — Ambulatory Visit (INDEPENDENT_AMBULATORY_CARE_PROVIDER_SITE_OTHER): Payer: Medicare Other

## 2018-04-08 ENCOUNTER — Encounter: Payer: Self-pay | Admitting: Family

## 2018-04-08 ENCOUNTER — Ambulatory Visit: Payer: Medicare Other | Admitting: Family

## 2018-04-08 ENCOUNTER — Encounter: Payer: Self-pay | Admitting: *Deleted

## 2018-04-08 VITALS — BP 108/66 | HR 111 | Temp 99.3°F | Resp 16 | Wt 185.1 lb

## 2018-04-08 DIAGNOSIS — J189 Pneumonia, unspecified organism: Secondary | ICD-10-CM

## 2018-04-08 DIAGNOSIS — J181 Lobar pneumonia, unspecified organism: Secondary | ICD-10-CM

## 2018-04-08 MED ORDER — BENZONATATE 100 MG PO CAPS: 100.0000 mg | ORAL_CAPSULE | Freq: Three times a day (TID) | ORAL | 0 refills | Status: DC | PRN

## 2018-04-08 MED ORDER — AZITHROMYCIN 250 MG PO TABS: ORAL_TABLET | ORAL | 0 refills | Status: DC

## 2018-04-08 NOTE — Telephone Encounter (Signed)
Patient was seen in office today by Earlie Counts.

## 2018-04-08 NOTE — Telephone Encounter (Signed)
This encounter was created in error - please disregard.

## 2018-04-08 NOTE — Progress Notes (Addendum)
Subjective:    Patient ID: Jon Sherman., male    DOB: 1928/03/03, 84 y.o.   MRN: [id number]  HPI  Jon Sherman is an 84 yr old male who presents today with chief complaint of cough.  Reports that cough started 2 days ago.  Reports that he was "coughing up blobs of phlegm."  Mostly it was white, but sometimes had a "green tint."  He reports + lethargy, not sleeping well due to cough.  Has mild HA from coughing as well.  Reports possibly some mild sob  He had temp "just a little under a hundred."  He denies known sick contacts.  Denies associated ear pain. Yesterday had a mild, sore throat.  Mild rhinorrhea.    Review of Systems Past Medical History:  Diagnosis Date  . Cancer ([place]) 1965   melanoma, LNs resected  . Infectious colitis 2000   requiring hospitalization  . Rosacea      Social History   Socioeconomic History  . Marital status: Married    Spouse name: Not on file  . Number of children: Not on file  . Years of education: Not on file  . Highest education level: Not on file  Occupational History  . Not on file  Social Needs  . Financial resource strain: Not hard at all  . Food insecurity:    Worry: Never true    Inability: Never true  . Transportation needs:    Medical: No    Non-medical: No  Tobacco Use  . Smoking status: Former Smoker    Types: Cigarettes    Last attempt to quit: 02/22/1962    Years since quitting: 56.1  . Smokeless tobacco: Never Used  Substance and Sexual Activity  . Alcohol use: Yes    Alcohol/week: 4.2 oz    Types: 7 Standard drinks or equivalent per week    Comment: Occasional wine and beer with meal   . Drug use: No  . Sexual activity: Never  Lifestyle  . Physical activity:    Days per week: Not on file    Minutes per session: Not on file  . Stress: Not on file  Relationships  . Social connections:    Talks on phone: Not on file    Gets together: Not on file    Attends religious service: Not on file    Active member of  club or organization: Not on file    Attends meetings of clubs or organizations: Not on file    Relationship status: Not on file  . Intimate partner violence:    Fear of current or ex partner: No    Emotionally abused: No    Physically abused: No    Forced sexual activity: No  Other Topics Concern  . Not on file  Social History Narrative   Married    Lives in [place] >10 years as of 02/2018     Past Surgical History:  Procedure Laterality Date  . MELANOMA EXCISION WITH SENTINEL LYMPH NODE BIOPSY    . SOFT TISSUE TUMOR RESECTION  1985    Family History  Problem Relation Age of Onset  . Mental illness Mother   . Heart failure Mother   . Heart failure Father   . Rheumatic fever Sister   . Cancer Neg Hx   . Drug abuse Neg Hx     Allergies  Allergen Reactions  . Demerol     Caused patient to feel nauseated  . Meperidine  Other reaction(s): Unknown    Current Outpatient Medications on File Prior to Visit  Medication Sig Dispense Refill  . aspirin 81 MG tablet Take 1 tablet (81 mg total) by mouth daily. 30 tablet 11  . metroNIDAZOLE (METROCREAM) 0.75 % cream Apply topically.    . Multiple Vitamins-Minerals (PRESERVISION AREDS 2) CAPS Take 1 capsule by mouth 2 (two) times daily.     Current Facility-Administered Medications on File Prior to Visit  Medication Dose Route Frequency Provider Last Rate Last Dose  . ipratropium-albuterol (DUONEB) 0.5-2.5 (3) MG/3ML nebulizer solution 3 mL  3 mL Nebulization Q6H McLean-Scocuzza, Nino Glow, MD   3 mL at 02/19/18 0915    BP 108/66 (BP Location: Left Arm, Patient Position: Sitting, Cuff Size: Normal)   Pulse (!) 111   Temp 99.3 F (37.4 C) (Oral)   Resp 16   Wt 185 lb 2 oz (84 kg)   SpO2 96%   BMI 28.99 kg/m       Objective:   Physical Exam  Constitutional: He appears well-developed. No distress.  Tired appearing elderly male  HENT:  Right Ear: Tympanic membrane and ear canal normal. Tympanic membrane is not  injected and not scarred.  Left Ear: Tympanic membrane and ear canal normal. Tympanic membrane is not injected and not scarred.  Mouth/Throat: No oropharyngeal exudate, posterior oropharyngeal edema or posterior oropharyngeal erythema.  Cardiovascular: Normal rate and regular rhythm.  Pulmonary/Chest: He has rales in the left middle field and the left lower field.  Musculoskeletal:  Trace bilateral LE edema          Assessment & Plan:  Pneumonia- exam most consistent with LLL PNA. Will obtain chest xray to confirm. Rx with zpak, tessalon prn.  Advised pt as follows:   If you develop increased weakness, sob, fever >101 over the weekend please proceed to the emergency room. Follow up with PCP on Tuesday. Office is closed Monday for the holiday.   Addendum: CXR negative for definite infiltrate but exam/hx consistent with PNA. Plan treatment/follow up as above.

## 2018-04-08 NOTE — Patient Instructions (Signed)
Please complete chest x-ray prior to leaving. Begin azithromycin for suspected pneumonia. You may use tessalon as needed for cough. If you develop increased weakness, sob, fever >101 over the weekend please proceed to the emergency room.

## 2018-04-09 ENCOUNTER — Ambulatory Visit: Payer: Self-pay | Admitting: *Deleted

## 2018-04-14 ENCOUNTER — Encounter: Payer: Self-pay | Admitting: Internal Medicine

## 2018-04-14 ENCOUNTER — Ambulatory Visit: Payer: Medicare Other | Admitting: Internal Medicine

## 2018-04-14 VITALS — BP 130/68 | HR 89 | Temp 98.2°F | Resp 15 | Wt 185.4 lb

## 2018-04-14 DIAGNOSIS — R0989 Other specified symptoms and signs involving the circulatory and respiratory systems: Secondary | ICD-10-CM | POA: Diagnosis not present

## 2018-04-14 DIAGNOSIS — R05 Cough: Secondary | ICD-10-CM

## 2018-04-14 DIAGNOSIS — R059 Cough, unspecified: Secondary | ICD-10-CM

## 2018-04-14 MED ORDER — ALBUTEROL SULFATE (2.5 MG/3ML) 0.083% IN NEBU
2.5000 mg | INHALATION_SOLUTION | Freq: Once | RESPIRATORY_TRACT | Status: AC
  Administered 2018-04-14: 2.5 mg via RESPIRATORY_TRACT

## 2018-04-14 MED ORDER — PREDNISONE 10 MG PO TABS: ORAL_TABLET | ORAL | 0 refills | Status: DC

## 2018-04-14 MED ORDER — METHYLPREDNISOLONE ACETATE 40 MG/ML IJ SUSP
40.0000 mg | Freq: Once | INTRAMUSCULAR | Status: AC
  Administered 2018-04-14: 40 mg via INTRAMUSCULAR

## 2018-04-14 MED ORDER — ALBUTEROL SULFATE HFA 108 (90 BASE) MCG/ACT IN AERS
2.0000 | INHALATION_SPRAY | Freq: Four times a day (QID) | RESPIRATORY_TRACT | 0 refills | Status: DC | PRN
Start: 2018-04-14 — End: 2018-05-11

## 2018-04-14 MED ORDER — AEROCHAMBER PLUS FLO-VU MEDIUM MISC: 1.0000 | Freq: Once | 0 refills | Status: AC

## 2018-04-14 NOTE — Progress Notes (Signed)
Subjective:  Patient ID: Jon Downer., male    DOB: 28-Dec-1927  Age: 84 y.o. MRN: [id number]  CC: The primary encounter diagnosis was Rales. Diagnoses of Cough, Bilateral rales, and Cough in adult patient were also pertinent to this visit.  HPI Jon Sherman. presents for persistent cough  productive of white sputum that started 2 days prior to  Evaluation on  May 23 when he was treated by NP for pneumonia.  Chest  Radiography was clear; and he  received azithromycin.  However, the  cough has persisted and is now nonproductive .  He reports Loss of appetite, dry mouth ,  t max of 100.0, profound fatigue.  He denies dyspnea . He dDenies watery diarrhea but the cough has caused occasional fecal incontinence.    Was treated In  April with doxycycline and tamiflu for confirmed influenza,   chest x ray was also done and clear as well . symptoms completely resolved prior  to current illness, .   Lung exam abnormal:  Rales, and wheezing bilaterally    Outpatient Medications Prior to Visit  Medication Sig Dispense Refill  . aspirin 81 MG tablet Take 1 tablet (81 mg total) by mouth daily. 30 tablet 11  . benzonatate (TESSALON) 100 MG capsule Take 1 capsule (100 mg total) by mouth 3 (three) times daily as needed. 20 capsule 0  . metroNIDAZOLE (METROCREAM) 0.75 % cream Apply topically.    . Multiple Vitamins-Minerals (PRESERVISION AREDS 2) CAPS Take 1 capsule by mouth 2 (two) times daily.    Marland Kitchen azithromycin (ZITHROMAX) 250 MG tablet 2 tabs by mouth today, then one tab once daily for 4 more days. (Patient not taking: Reported on 04/14/2018) 6 tablet 0   Facility-Administered Medications Prior to Visit  Medication Dose Route Frequency Provider Last Rate Last Dose  . ipratropium-albuterol (DUONEB) 0.5-2.5 (3) MG/3ML nebulizer solution 3 mL  3 mL Nebulization Q6H McLean-Scocuzza, Nino Glow, MD   3 mL at 02/19/18 0915    Review of Systems;  Patient denies headache,unintentional weight loss, skin  rash, eye pain, sinus congestion and sinus pain, sore throat, dysphagia,  hemoptysis , dyspnea,  chest pain, palpitations, orthopnea, edema, abdominal pain, nausea, melena, diarrhea, constipation, flank pain, dysuria, hematuria, urinary  Frequency, nocturia, numbness, tingling, seizures,  Focal weakness, Loss of consciousness,  Tremor, insomnia, depression, anxiety, and suicidal ideation.      Objective:  BP 130/68 (BP Location: Left Arm, Patient Position: Sitting, Cuff Size: Normal)   Pulse 89   Temp 98.2 F (36.8 C) (Oral)   Resp 15   Wt 185 lb 6 oz (84.1 kg)   SpO2 96%   BMI 29.03 kg/m   BP Readings from Last 3 Encounters:  04/14/18 130/68  04/08/18 108/66  03/19/18 112/68    Wt Readings from Last 3 Encounters:  04/14/18 185 lb 6 oz (84.1 kg)  04/08/18 185 lb 2 oz (84 kg)  03/19/18 188 lb 6.4 oz (85.5 kg)    General appearance: alert, cooperative and appears stated age Ears: normal TM's and external ear canals both ears Throat: lips, mucosa, and tongue normal; teeth and gums normal Neck: no adenopathy, no carotid bruit, supple, symmetrical, trachea midline and thyroid not enlarged, symmetric, no tenderness/mass/nodules Back: symmetric, no curvature. ROM normal. No CVA tenderness. Lungs: bilateral low lobe rales ,  Clear anteriorly Heart: regular rate and rhythm, S1, S2 normal, no murmur, click, rub or gallop Abdomen: soft, non-tender; bowel sounds normal; no masses,  no organomegaly Pulses:  2+ and symmetric Skin: Skin color, texture, turgor normal. No rashes or lesions Lymph nodes: Cervical, supraclavicular, and axillary nodes normal.  No results found for: HGBA1C  Lab Results  Component Value Date   CREATININE 1.11 06/04/2017   CREATININE 1.0 02/22/2014   CREATININE 1.1 03/01/2013    Lab Results  Component Value Date   WBC 7.5 06/04/2017   HGB 14.5 06/04/2017   HCT 43.8 06/04/2017   PLT 228.0 06/04/2017   GLUCOSE 89 06/04/2017   CHOL 148 06/04/2017   TRIG  208.0 (H) 06/04/2017   HDL 51.20 06/04/2017   LDLDIRECT 81.0 06/04/2017   LDLCALC 83 02/26/2015   ALT 18 06/04/2017   AST 15 06/04/2017   NA 136 06/04/2017   K 4.8 06/04/2017   CL 103 06/04/2017   CREATININE 1.11 06/04/2017   BUN 18 06/04/2017   CO2 29 06/04/2017   TSH 2.49 06/04/2017    No results found.  Assessment & Plan:   Problem List Items Addressed This Visit    Cough in adult patient    Persistent post recentl viral (vs bacterial) respiratory illness.  IM DepoMedrol and albuterol neb  Given. Along with prednisone taper and cough suppressant.       Bilateral rales    Bilaterally,  Lower lobes,  No change with albuterol neb. He is not short of breath and  has a nonproductive cough.  CT chest ordered to rule out interstitial lung disease        Other Visit Diagnoses    Rales    -  Primary   Relevant Medications   methylPREDNISolone acetate (DEPO-MEDROL) injection 40 mg (Completed)   albuterol (PROVENTIL) (2.5 MG/3ML) 0.083% nebulizer solution 2.5 mg (Completed)   Other Relevant Orders   CT Chest Wo Contrast   Cough       Relevant Medications   methylPREDNISolone acetate (DEPO-MEDROL) injection 40 mg (Completed)   albuterol (PROVENTIL) (2.5 MG/3ML) 0.083% nebulizer solution 2.5 mg (Completed)   Other Relevant Orders   CT Chest Wo Contrast    A total of 25 minutes of face to face time was spent with patient more than half of which was spent in counselling about the above mentioned conditions  and coordination of care   I have discontinued Jon Millet Jr.'s azithromycin. I am also having him start on predniSONE, albuterol, and AEROCHAMBER PLUS FLO-VU MEDIUM. Additionally, I am having him maintain his PRESERVISION AREDS 2, metroNIDAZOLE, aspirin, and benzonatate. We administered methylPREDNISolone acetate and albuterol. We will continue to administer ipratropium-albuterol.  Meds ordered this encounter  Medications  . predniSONE (DELTASONE) 10 MG tablet    Sig: 6  tablets on Day 1 , then reduce by 1 tablet daily until gone    Dispense:  21 tablet    Refill:  0  . methylPREDNISolone acetate (DEPO-MEDROL) injection 40 mg  . albuterol (PROVENTIL) (2.5 MG/3ML) 0.083% nebulizer solution 2.5 mg  . albuterol (PROVENTIL HFA;VENTOLIN HFA) 108 (90 Base) MCG/ACT inhaler    Sig: Inhale 2 puffs into the lungs every 6 (six) hours as needed for wheezing or shortness of breath.    Dispense:  1 Inhaler    Refill:  0  . Spacer/Aero-Holding Chambers (AEROCHAMBER PLUS FLO-VU MEDIUM) MISC    Sig: 1 each by Other route once for 1 dose. USE WITH ALBUTEROL MDI    Dispense:  1 each    Refill:  0    PLEASE DEMONSTRATE TO PATIENT FOR USE WITH ALBUTEROL MDI    Medications Discontinued  During This Encounter  Medication Reason  . azithromycin (ZITHROMAX) 250 MG tablet Completed Course    Follow-up: Return in about 1 month (around 05/12/2018) for cough,  CT scan .   Crecencio Mc, MD

## 2018-04-14 NOTE — Patient Instructions (Addendum)
Your lungs still sound inflamed  I am prescribing a prednisone taper for you to start tomorrow morning:  6 tablets all at once on Day 1,  Then taper by 1 tablet daily until gone  I am also prescribing a bronchodilator called "albuterol"  to use every 6 hours if needed for wheezing    I have ordered a CT scan to look at your lungs a little more closely

## 2018-04-17 DIAGNOSIS — R05 Cough: Secondary | ICD-10-CM | POA: Insufficient documentation

## 2018-04-17 DIAGNOSIS — R0989 Other specified symptoms and signs involving the circulatory and respiratory systems: Secondary | ICD-10-CM | POA: Insufficient documentation

## 2018-04-17 DIAGNOSIS — R059 Cough, unspecified: Secondary | ICD-10-CM | POA: Insufficient documentation

## 2018-04-17 NOTE — Assessment & Plan Note (Signed)
Bilaterally,  Lower lobes,  No change with albuterol neb. He is not short of breath and  has a nonproductive cough.  CT chest ordered to rule out interstitial lung disease

## 2018-04-17 NOTE — Assessment & Plan Note (Signed)
Persistent post recentl viral (vs bacterial) respiratory illness.  IM DepoMedrol and albuterol neb  Given. Along with prednisone taper and cough suppressant.

## 2018-04-27 ENCOUNTER — Ambulatory Visit
Admission: RE | Admit: 2018-04-27 | Discharge: 2018-04-27 | Disposition: A | Discharge status: Home / Self Care | Payer: Medicare Other | Source: Ambulatory Visit | Attending: Internal Medicine | Admitting: Internal Medicine

## 2018-04-27 DIAGNOSIS — K449 Diaphragmatic hernia without obstruction or gangrene: Secondary | ICD-10-CM | POA: Insufficient documentation

## 2018-04-27 DIAGNOSIS — R918 Other nonspecific abnormal finding of lung field: Secondary | ICD-10-CM | POA: Insufficient documentation

## 2018-04-27 DIAGNOSIS — I722 Aneurysm of renal artery: Secondary | ICD-10-CM | POA: Insufficient documentation

## 2018-04-27 DIAGNOSIS — J439 Emphysema, unspecified: Secondary | ICD-10-CM | POA: Insufficient documentation

## 2018-04-27 DIAGNOSIS — R059 Cough, unspecified: Secondary | ICD-10-CM

## 2018-04-27 DIAGNOSIS — I2510 Atherosclerotic heart disease of native coronary artery without angina pectoris: Secondary | ICD-10-CM | POA: Insufficient documentation

## 2018-04-27 DIAGNOSIS — R05 Cough: Secondary | ICD-10-CM | POA: Diagnosis present

## 2018-04-27 DIAGNOSIS — R0989 Other specified symptoms and signs involving the circulatory and respiratory systems: Secondary | ICD-10-CM | POA: Diagnosis present

## 2018-04-27 DIAGNOSIS — K8020 Calculus of gallbladder without cholecystitis without obstruction: Secondary | ICD-10-CM | POA: Insufficient documentation

## 2018-04-28 ENCOUNTER — Other Ambulatory Visit: Payer: Self-pay | Admitting: Internal Medicine

## 2018-04-28 DIAGNOSIS — R05 Cough: Secondary | ICD-10-CM

## 2018-04-28 DIAGNOSIS — R053 Chronic cough: Secondary | ICD-10-CM

## 2018-04-28 DIAGNOSIS — R9389 Abnormal findings on diagnostic imaging of other specified body structures: Secondary | ICD-10-CM

## 2018-05-11 ENCOUNTER — Other Ambulatory Visit: Payer: Self-pay | Admitting: Internal Medicine

## 2018-05-17 NOTE — Progress Notes (Signed)
[facility] Consultation      Assessment and Plan:  Chronic bronchitis with dyspnea on exertion.  --Dyspnea on moderate to severe exertion, very good functional status.  --Will prescribe albuterol inhaler to be used as needed for dyspnea.   GERD.  --Some tree-in-bud changes, suspect that the patient has some pneumonitis related to reflux.  --Will start PPI.   Meds ordered this encounter  Medications  . omeprazole (PRILOSEC) 20 MG capsule    Sig: Take 1 capsule (20 mg total) by mouth daily.    Dispense:  30 capsule    Refill:  11      Date: 05/18/2018  MRN# [id number] Jon Sherman. 06-29-1928   Jon Sherman. is a 84 y.o. old male seen in consultation for chief complaint of:    Chief Complaint  Patient presents with  . Consult    referred by Dr. Derrel Nip for abn CT. pt c/o sob with exertion. He had some cough which has resolved    HPI:  The patient is an 84 yo male who lives at [facility], lives with his wife in [place]. He notes that back in April he had the flu, and since that time he has been having some trouble breathing on heavy exertion. He sometimes he has to take deep breaths since that time. This is not constant, only with heavy exertion. He is fairly active and indepent, he drives a car, can walk a supermarket without a problem. He walks around the [place] normally without difficulty, but sometimes if he walks up a grade he will have to take deep breaths.  He has never been diagnosed with COPD or asthma, he has smoked for 10 years, last about 50 years ago. He worked in [employer], he has no occupational exposures. He is now retired, since the age of 84.  He has an inhaler, used for a bit not anymore because he did not feel the need.  He has occasional reflux a few times per week.  Has occasional sinus drainage.   **Desat walk 05/18/18>> At rest on RA, sat was 98% and HR 70; Pt walked 360 feet at brisk pace, no dyspnea,  sat was 95% and HR 90.  **CT chest 04/27/2018>> scattered mild tree-in-bud changes peripherally, dilated esophagus  PMHX:   Past Medical History:  Diagnosis Date  . Cancer ([place]) 1965   melanoma, LNs resected  . Infectious colitis 2000   requiring hospitalization  . Rosacea    Surgical Hx:  Past Surgical History:  Procedure Laterality Date  . MELANOMA EXCISION WITH SENTINEL LYMPH NODE BIOPSY    . SOFT TISSUE TUMOR RESECTION  1985   Family Hx:  Family History  Problem Relation Age of Onset  . Mental illness Mother   . Heart failure Mother   . Heart failure Father   . Rheumatic fever Sister   . Cancer Neg Hx   . Drug abuse Neg Hx    Social Hx:   Social History   Tobacco Use  . Smoking status: Former Smoker    Packs/day: 1.00    Years: 10.00    Pack years: 10.00    Types: Cigarettes    Last attempt to quit: 02/22/1962    Years since quitting: 56.2  . Smokeless tobacco: Never Used  Substance Use Topics  . Alcohol use: Yes    Alcohol/week: 4.2 oz    Types: 7 Standard drinks or equivalent per week    Comment:  Occasional wine and beer with meal   . Drug use: No   Medication:    Current Outpatient Medications:  .  albuterol (PROVENTIL HFA;VENTOLIN HFA) 108 (90 Base) MCG/ACT inhaler, TAKE 2 PUFFS BY MOUTH INTO LUNGS EVERY 6 HOURS AS NEEDED FOR WHEEZE OR SHORTNESS OF BREATH, Disp: 18 Inhaler, Rfl: 0 .  aspirin 81 MG tablet, Take 1 tablet (81 mg total) by mouth daily., Disp: 30 tablet, Rfl: 11 .  metroNIDAZOLE (METROCREAM) 0.75 % cream, Apply topically., Disp: , Rfl:  .  Multiple Vitamins-Minerals (PRESERVISION AREDS 2) CAPS, Take 1 capsule by mouth 2 (two) times daily., Disp: , Rfl:   Current Facility-Administered Medications:  .  ipratropium-albuterol (DUONEB) 0.5-2.5 (3) MG/3ML nebulizer solution 3 mL, 3 mL, Nebulization, Q6H, McLean-Scocuzza, Nino Glow, MD, 3 mL at 02/19/18 0915   Allergies:  Demerol and Meperidine  Review of Systems: Gen:  Denies  fever, sweats,  chills HEENT: Denies blurred vision, double vision. bleeds, sore throat Cvc:  No dizziness, chest pain. Resp:   Denies cough or sputum production, shortness of breath Gi: Denies swallowing difficulty, stomach pain. Gu:  Denies bladder incontinence, burning urine Ext:   No Joint pain, stiffness. Skin: No skin rash,  hives  Endoc:  No polyuria, polydipsia. Psych: No depression, insomnia. Other:  All other systems were reviewed with the patient and were negative other that what is mentioned in the HPI.   Physical Examination:   VS: BP 124/72 (BP Location: Left Arm, Cuff Size: Normal)   Pulse 69   Resp 16   Ht 5\' 7"  (1.702 m)   Wt 188 lb (85.3 kg)   SpO2 97%   BMI 29.44 kg/m   General Appearance: No distress  Neuro:without focal findings,  speech normal,  HEENT: PERRLA, EOM intact.   Pulmonary: normal breath sounds, No wheezing.  CardiovascularNormal S1,S2.  No m/r/g.   Abdomen: Benign, Soft, non-tender. Renal:  No costovertebral tenderness  GU:  No performed at this time. Endoc: No evident thyromegaly, no signs of acromegaly. Skin:   warm, no rashes, no ecchymosis  Extremities: normal, no cyanosis, clubbing.  Other findings:    LABORATORY PANEL:   CBC No results for input(s): WBC, HGB, HCT, PLT in the last 168 hours. ------------------------------------------------------------------------------------------------------------------  Chemistries  No results for input(s): NA, K, CL, CO2, GLUCOSE, BUN, CREATININE, CALCIUM, MG, AST, ALT, ALKPHOS, BILITOT in the last 168 hours.  Invalid input(s): GFRCGP ------------------------------------------------------------------------------------------------------------------  Cardiac Enzymes No results for input(s): TROPONINI in the last 168 hours. ------------------------------------------------------------  RADIOLOGY:  No results found.     Thank  you for the consultation and for allowing [facility] to assist in the care of your patient. Our recommendations are noted above.  Please contact us if we can be of further service.   Marda Stalker, M.D., F.C.C.P.  Board Certified in Internal Medicine, Pulmonary Medicine, [place], and Sleep Medicine.  [facility] Office Number: [phone]   05/18/2018

## 2018-05-18 ENCOUNTER — Ambulatory Visit: Payer: Medicare Other | Admitting: Internal Medicine

## 2018-05-18 ENCOUNTER — Encounter: Payer: Self-pay | Admitting: Internal Medicine

## 2018-05-18 VITALS — BP 124/72 | HR 69 | Resp 16 | Ht 67.0 in | Wt 188.0 lb

## 2018-05-18 DIAGNOSIS — R0609 Other forms of dyspnea: Secondary | ICD-10-CM | POA: Diagnosis not present

## 2018-05-18 DIAGNOSIS — J418 Mixed simple and mucopurulent chronic bronchitis: Secondary | ICD-10-CM

## 2018-05-18 MED ORDER — OMEPRAZOLE 20 MG PO CPDR: 20.0000 mg | DELAYED_RELEASE_CAPSULE | Freq: Every day | ORAL | 11 refills | Status: DC

## 2018-05-18 NOTE — Patient Instructions (Signed)
You should carry your inhaler with you and use 1-2 puffs as needed for trouble breathing.   Start omeprazole 20 mg once daily.

## 2018-06-29 DIAGNOSIS — C4491 Basal cell carcinoma of skin, unspecified: Secondary | ICD-10-CM

## 2018-06-29 HISTORY — DX: Basal cell carcinoma of skin, unspecified: C44.91

## 2018-07-05 DIAGNOSIS — D0371 Melanoma in situ of right lower limb, including hip: Secondary | ICD-10-CM

## 2018-07-05 HISTORY — DX: Melanoma in situ of right lower limb, including hip: D03.71

## 2018-07-07 ENCOUNTER — Encounter: Payer: Self-pay | Admitting: Internal Medicine

## 2018-07-07 ENCOUNTER — Ambulatory Visit (INDEPENDENT_AMBULATORY_CARE_PROVIDER_SITE_OTHER): Payer: Medicare Other | Admitting: Internal Medicine

## 2018-07-07 VITALS — BP 136/66 | HR 70 | Temp 98.1°F | Resp 16 | Ht 67.0 in | Wt 191.8 lb

## 2018-07-07 DIAGNOSIS — Z0000 Encounter for general adult medical examination without abnormal findings: Secondary | ICD-10-CM | POA: Diagnosis not present

## 2018-07-07 DIAGNOSIS — R059 Cough, unspecified: Secondary | ICD-10-CM

## 2018-07-07 DIAGNOSIS — E559 Vitamin D deficiency, unspecified: Secondary | ICD-10-CM | POA: Diagnosis not present

## 2018-07-07 DIAGNOSIS — R5383 Other fatigue: Secondary | ICD-10-CM

## 2018-07-07 DIAGNOSIS — E785 Hyperlipidemia, unspecified: Secondary | ICD-10-CM

## 2018-07-07 DIAGNOSIS — R05 Cough: Secondary | ICD-10-CM

## 2018-07-07 LAB — CBC WITH DIFFERENTIAL/PLATELET
BASOS ABS: 0.1 10*3/uL (ref 0.0–0.1)
BASOS PCT: 0.9 % (ref 0.0–3.0)
Eosinophils Absolute: 0.1 10*3/uL (ref 0.0–0.7)
Eosinophils Relative: 2.1 % (ref 0.0–5.0)
HEMATOCRIT: 44.4 % (ref 39.0–52.0)
Hemoglobin: 15.1 g/dL (ref 13.0–17.0)
LYMPHS ABS: 1 10*3/uL (ref 0.7–4.0)
LYMPHS PCT: 18.6 % (ref 12.0–46.0)
MCHC: 34 g/dL (ref 30.0–36.0)
MCV: 93.3 fl (ref 78.0–100.0)
MONOS PCT: 9.8 % (ref 3.0–12.0)
Monocytes Absolute: 0.5 10*3/uL (ref 0.1–1.0)
NEUTROS ABS: 3.8 10*3/uL (ref 1.4–7.7)
NEUTROS PCT: 68.6 % (ref 43.0–77.0)
PLATELETS: 226 10*3/uL (ref 150.0–400.0)
RBC: 4.76 Mil/uL (ref 4.22–5.81)
RDW: 13.7 % (ref 11.5–15.5)
WBC: 5.5 10*3/uL (ref 4.0–10.5)

## 2018-07-07 LAB — COMPREHENSIVE METABOLIC PANEL
ALK PHOS: 73 U/L (ref 39–117)
ALT: 17 U/L (ref 0–53)
AST: 15 U/L (ref 0–37)
Albumin: 4.4 g/dL (ref 3.5–5.2)
BUN: 19 mg/dL (ref 6–23)
CO2: 29 meq/L (ref 19–32)
Calcium: 9.5 mg/dL (ref 8.4–10.5)
Chloride: 102 mEq/L (ref 96–112)
Creatinine, Ser: 1.22 mg/dL (ref 0.40–1.50)
GFR: 59.55 mL/min — AB (ref >60.00)
GLUCOSE: 115 mg/dL — AB (ref 70–99)
POTASSIUM: 4.7 meq/L (ref 3.5–5.1)
SODIUM: 138 meq/L (ref 135–145)
TOTAL PROTEIN: 7 g/dL (ref 6.0–8.3)
Total Bilirubin: 0.9 mg/dL (ref 0.2–1.2)

## 2018-07-07 LAB — LIPID PANEL
CHOLESTEROL: 170 mg/dL (ref 0–200)
HDL: 55.8 mg/dL (ref >39.00)
LDL Cholesterol: 92 mg/dL (ref 0–99)
NonHDL: 114.46
TRIGLYCERIDES: 111 mg/dL (ref 0.0–149.0)
Total CHOL/HDL Ratio: 3
VLDL: 22.2 mg/dL (ref 0.0–40.0)

## 2018-07-07 LAB — VITAMIN D 25 HYDROXY (VIT D DEFICIENCY, FRACTURES): VITD: 20.69 ng/mL — ABNORMAL LOW (ref 30.00–100.00)

## 2018-07-07 LAB — VITAMIN B12: Vitamin B-12: 270 pg/mL (ref 211–911)

## 2018-07-07 LAB — TSH: TSH: 2.89 u[IU]/mL (ref 0.35–4.50)

## 2018-07-07 NOTE — Progress Notes (Signed)
Patient ID: Jon Sherman., male    DOB: Apr 09, 1928  Age: 84 y.o. MRN: [id number]  The patient is here for annual preventive exam and  management of other chronic and acute problems.   The risk factors are reflected in the social history.  The roster of all physicians providing medical care to patient - is listed in the Snapshot section of the chart.  Activities of daily living:  The patient is 100% independent in all ADLs: dressing, toileting, feeding as well as independent mobility  Home safety : The patient has smoke detectors in the home. They wear seatbelts.  There are no firearms at home. There is no violence in the home.   There is no risks for hepatitis, STDs or HIV. There is no   history of blood transfusion. They have no travel history to infectious disease endemic areas of the world.  The patient has seen their dentist in the last six month. They have seen their eye doctor in the last year. They admit to slight hearing difficulty with regard to whispered voices and some television programs.  They have deferred audiologic testing in the last year.  They do not  have excessive sun exposure. Discussed the need for sun protection: hats, long sleeves and use of sunscreen if there is significant sun exposure.   Diet: the importance of a healthy diet is discussed. They do have a healthy diet.  The benefits of regular aerobic exercise were discussed. He walks daily at [facility] about 20 minutes,  20 minutes.   Depression screen: there are no signs or vegative symptoms of depression- irritability, change in appetite, anhedonia, sadness/tearfullness.  Cognitive assessment: the patient manages all their financial and personal affairs and is actively engaged. They could relate day,date,year and events; recalled 2/3 objects at 3 minutes; performed clock-face test normally.  100 93 86 79 72 65   The following portions of the patient's history were reviewed and updated as appropriate:  allergies, current medications, past family history, past medical history,  past surgical history, past social history  and problem list.  Visual acuity was not assessed per patient preference since she has regular follow up with her ophthalmologist im October.  Hearing and body mass index were assessed and reviewed.   During the course of the visit the patient was educated and counseled about appropriate screening and preventive services including : fall prevention , diabetes screening, nutrition counseling, colorectal cancer screening, and recommended immunizations.    CC: The primary encounter diagnosis was Other fatigue. Diagnoses of Hyperlipidemia LDL goal <130, Vitamin D deficiency, Routine general medical examination at a health care facility, and Cough in adult patient were also pertinent to this visit.  Persistent cough, post pneumonia   Sent for CT.  abnormal chest CT:  Sent to pulmonary in July  Anti reflux measures added .cough has resolved.  Melanoma removed from right knee ,  Basal cell removed from  forehead  July 01 2018 Jon Sherman has a past medical history of Cancer ([place]) (1965), Infectious colitis (2000), and Rosacea.   He has a past surgical history that includes Melanoma excision with sentinel lymph node dissection and Soft Tissue Tumor Resection (1985).   His family history includes Heart failure in his father and mother; Mental illness in his mother; Rheumatic fever in his sister.He reports that he quit smoking about 56 years ago. His smoking use included cigarettes. He has a 10.00 pack-year smoking history. He has never  used smokeless tobacco. He reports that he drinks about 7.0 standard drinks of alcohol per week. He reports that he does not use drugs.  Outpatient Medications Prior to Visit  Medication Sig Dispense Refill  . albuterol (PROVENTIL HFA;VENTOLIN HFA) 108 (90 Base) MCG/ACT inhaler TAKE 2 PUFFS BY MOUTH INTO LUNGS EVERY 6 HOURS AS NEEDED  FOR WHEEZE OR SHORTNESS OF BREATH 18 Inhaler 0  . metroNIDAZOLE (METROCREAM) 0.75 % cream Apply topically.    . Multiple Vitamins-Minerals (PRESERVISION AREDS 2) CAPS Take 1 capsule by mouth 2 (two) times daily.    Marland Kitchen omeprazole (PRILOSEC) 20 MG capsule Take 1 capsule (20 mg total) by mouth daily. 30 capsule 11  . aspirin 81 MG tablet Take 1 tablet (81 mg total) by mouth daily. (Patient not taking: Reported on 07/07/2018) 30 tablet 11   Facility-Administered Medications Prior to Visit  Medication Dose Route Frequency Provider Last Rate Last Dose  . ipratropium-albuterol (DUONEB) 0.5-2.5 (3) MG/3ML nebulizer solution 3 mL  3 mL Nebulization Q6H McLean-Scocuzza, Nino Glow, MD   3 mL at 02/19/18 0915    Review of Systems   Patient denies headache, fevers, malaise, unintentional weight loss, skin rash, eye pain, sinus congestion and sinus pain, sore throat, dysphagia,  hemoptysis , cough, dyspnea, wheezing, chest pain, palpitations, orthopnea, edema, abdominal pain, nausea, melena, diarrhea, constipation, flank pain, dysuria, hematuria, urinary  Frequency, nocturia, numbness, tingling, seizures,  Focal weakness, Loss of consciousness,  Tremor, insomnia, depression, anxiety, and suicidal ideation.      Objective:  BP 136/66 (BP Location: Left Arm, Patient Position: Sitting, Cuff Size: Normal)   Pulse 70   Temp 98.1 F (36.7 C) (Oral)   Resp 16   Ht 5\' 7"  (1.702 m)   Wt 191 lb 12.8 oz (87 kg)   SpO2 95%   BMI 30.04 kg/m   Physical Exam   General appearance: alert, cooperative and appears stated age Ears: normal TM's and external ear canals both ears Throat: lips, mucosa, and tongue normal; teeth and gums normal Neck: no adenopathy, no carotid bruit, supple, symmetrical, trachea midline and thyroid not enlarged, symmetric, no tenderness/mass/nodules Back: symmetric, no curvature. ROM normal. No CVA tenderness. Lungs: clear to auscultation bilaterally Heart: regular rate and rhythm, S1, S2  normal, no murmur, click, rub or gallop Abdomen: soft, non-tender; bowel sounds normal; no masses,  no organomegaly Pulses: 2+ and symmetric Skin: Skin color, texture, turgor normal. No rashes or lesions Lymph nodes: Cervical, supraclavicular, and axillary nodes normal.   Assessment & Plan:   Problem List Items Addressed This Visit    Routine general medical examination at a health care facility    age appropriate education and counseling updated, referrals for preventative services and immunizations addressed, dietary and smoking counseling addressed, most recent labs reviewed.  I have personally reviewed and have noted:  1) the patient's medical and social history 2) The pt's use of alcohol, tobacco, and illicit drugs 3) The patient's current medications and supplements 4) Functional ability including ADL's, fall risk, home safety risk, hearing and visual impairment 5) Diet and physical activities 6) Evidence for depression or mood disorder 7) The patient's height, weight, and BMI have been recorded in the chart  I have made referrals, and provided counseling and education based on review of the above      Fatigue - Primary   Relevant Orders   Comprehensive metabolic panel (Completed)   CBC with Differential/Platelet (Completed)   Vitamin B12 (Completed)   Vitamin D deficiency  Relevant Orders   VITAMIN D 25 Hydroxy (Vit-D Deficiency, Fractures) (Completed)   Cough in adult patient    Improved with anti reflux treatment        Other Visit Diagnoses    Hyperlipidemia LDL goal <130       Relevant Orders   Lipid panel (Completed)   TSH (Completed)      I have discontinued Shon Millet Jr.'s aspirin. I am also having him maintain his PRESERVISION AREDS 2, metroNIDAZOLE, albuterol, and omeprazole. We will continue to administer ipratropium-albuterol.  No orders of the defined types were placed in this encounter.   Medications Discontinued During This Encounter   Medication Reason  . aspirin 81 MG tablet     Follow-up: Return in about 1 year (around 07/08/2019).   Crecencio Mc, MD

## 2018-07-07 NOTE — Patient Instructions (Addendum)
Continue taking the omeprazole 30 minutes prior to breakfast for a total of  3 months   If you stop it and the cough returns,  Resume the medication    Don't forget to get your second dose of the ShingRx vaccine  By the end of September ( 6 months)   Health Maintenance, Male A healthy lifestyle and preventive care is important for your health and wellness. Ask your health care provider about what schedule of regular examinations is right for you. What should I know about weight and diet? Eat a Healthy Diet  Eat plenty of vegetables, fruits, whole grains, low-fat dairy products, and lean protein.  Do not eat a lot of foods high in solid fats, added sugars, or salt.  Maintain a Healthy Weight Regular exercise can help you achieve or maintain a healthy weight. You should:  Do at least 150 minutes of exercise each week. The exercise should increase your heart rate and make you sweat (moderate-intensity exercise).  Do strength-training exercises at least twice a week.  Watch Your Levels of Cholesterol and Blood Lipids  Have your blood tested for lipids and cholesterol every 5 years starting at 82 years of age. If you are at high risk for heart disease, you should start having your blood tested when you are 82 years old. You may need to have your cholesterol levels checked more often if: ? Your lipid or cholesterol levels are high. ? You are older than 82 years of age. ? You are at high risk for heart disease.  What should I know about cancer screening? Many types of cancers can be detected early and may often be prevented. Lung Cancer  You should be screened every year for lung cancer if: ? You are a current smoker who has smoked for at least 30 years. ? You are a former smoker who has quit within the past 15 years.  Talk to your health care provider about your screening options, when you should start screening, and how often you should be screened.  Colorectal Cancer  Routine  colorectal cancer screening usually begins at 82 years of age and should be repeated every 5-10 years until you are 82 years old. You may need to be screened more often if early forms of precancerous polyps or small growths are found. Your health care provider may recommend screening at an earlier age if you have risk factors for colon cancer.  Your health care provider may recommend using home test kits to check for hidden blood in the stool.  A small camera at the end of a tube can be used to examine your colon (sigmoidoscopy or colonoscopy). This checks for the earliest forms of colorectal cancer.  Prostate and Testicular Cancer  Depending on your age and overall health, your health care provider may do certain tests to screen for prostate and testicular cancer.  Talk to your health care provider about any symptoms or concerns you have about testicular or prostate cancer.  Skin Cancer  Check your skin from head to toe regularly.  Tell your health care provider about any new moles or changes in moles, especially if: ? There is a change in a mole's size, shape, or color. ? You have a mole that is larger than a pencil eraser.  Always use sunscreen. Apply sunscreen liberally and repeat throughout the day.  Protect yourself by wearing long sleeves, pants, a wide-brimmed hat, and sunglasses when outside.  What should I know about heart disease, diabetes,  and high blood pressure?  If you are 76-18 years of age, have your blood pressure checked every 3-5 years. If you are 36 years of age or older, have your blood pressure checked every year. You should have your blood pressure measured twice-once when you are at a hospital or clinic, and once when you are not at a hospital or clinic. Record the average of the two measurements. To check your blood pressure when you are not at a hospital or clinic, you can use: ? An automated blood pressure machine at a pharmacy. ? A home blood pressure  monitor.  Talk to your health care provider about your target blood pressure.  If you are between 2-75 years old, ask your health care provider if you should take aspirin to prevent heart disease.  Have regular diabetes screenings by checking your fasting blood sugar level. ? If you are at a normal weight and have a low risk for diabetes, have this test once every three years after the age of 19. ? If you are overweight and have a high risk for diabetes, consider being tested at a younger age or more often.  A one-time screening for abdominal aortic aneurysm (AAA) by ultrasound is recommended for men aged 58-75 years who are current or former smokers. What should I know about preventing infection? Hepatitis B If you have a higher risk for hepatitis B, you should be screened for this virus. Talk with your health care provider to find out if you are at risk for hepatitis B infection. Hepatitis C Blood testing is recommended for:  Everyone born from [birth year] through 1963.  Anyone with known risk factors for hepatitis C.  Sexually Transmitted Diseases (STDs)  You should be screened each year for STDs including gonorrhea and chlamydia if: ? You are sexually active and are younger than 82 years of age. ? You are older than 82 years of age and your health care provider tells you that you are at risk for this type of infection. ? Your sexual activity has changed since you were last screened and you are at an increased risk for chlamydia or gonorrhea. Ask your health care provider if you are at risk.  Talk with your health care provider about whether you are at high risk of being infected with HIV. Your health care provider may recommend a prescription medicine to help prevent HIV infection.  What else can I do?  Schedule regular health, dental, and eye exams.  Stay current with your vaccines (immunizations).  Do not use any tobacco products, such as cigarettes, chewing tobacco, and  e-cigarettes. If you need help quitting, ask your health care provider.  Limit alcohol intake to no more than 2 drinks per day. One drink equals 12 ounces of beer, 5 ounces of wine, or 1 ounces of hard liquor.  Do not use street drugs.  Do not share needles.  Ask your health care provider for help if you need support or information about quitting drugs.  Tell your health care provider if you often feel depressed.  Tell your health care provider if you have ever been abused or do not feel safe at home. This information is not intended to replace advice given to you by your health care provider. Make sure you discuss any questions you have with your health care provider. Document Released: 05/01/2008 Document Revised: 07/02/2016 Document Reviewed: 08/07/2015 Elsevier Interactive Patient Education  [employer].

## 2018-07-08 NOTE — Assessment & Plan Note (Signed)

## 2018-07-08 NOTE — Assessment & Plan Note (Signed)
Improved with anti reflux treatment

## 2018-09-15 ENCOUNTER — Ambulatory Visit: Payer: Medicare Other | Admitting: Internal Medicine

## 2018-09-15 ENCOUNTER — Encounter: Payer: Self-pay | Admitting: Internal Medicine

## 2018-09-15 VITALS — BP 144/70 | HR 89 | Temp 98.8°F | Ht 67.0 in | Wt 196.1 lb

## 2018-09-15 DIAGNOSIS — R058 Other specified cough: Secondary | ICD-10-CM

## 2018-09-15 DIAGNOSIS — J449 Chronic obstructive pulmonary disease, unspecified: Secondary | ICD-10-CM | POA: Diagnosis not present

## 2018-09-15 DIAGNOSIS — R05 Cough: Secondary | ICD-10-CM

## 2018-09-15 DIAGNOSIS — J069 Acute upper respiratory infection, unspecified: Secondary | ICD-10-CM | POA: Diagnosis not present

## 2018-09-15 DIAGNOSIS — J441 Chronic obstructive pulmonary disease with (acute) exacerbation: Secondary | ICD-10-CM | POA: Diagnosis not present

## 2018-09-15 MED ORDER — ALBUTEROL SULFATE (2.5 MG/3ML) 0.083% IN NEBU
2.5000 mg | INHALATION_SOLUTION | Freq: Once | RESPIRATORY_TRACT | Status: AC
  Administered 2018-09-15: 2.5 mg via RESPIRATORY_TRACT

## 2018-09-15 MED ORDER — METHYLPREDNISOLONE ACETATE 40 MG/ML IJ SUSP: 40.0000 mg | Freq: Once | INTRAMUSCULAR | Status: DC

## 2018-09-15 MED ORDER — AMOXICILLIN-POT CLAVULANATE 875-125 MG PO TABS: 1.0000 | ORAL_TABLET | Freq: Two times a day (BID) | ORAL | 0 refills | Status: DC

## 2018-09-15 MED ORDER — IPRATROPIUM BROMIDE 0.02 % IN SOLN
0.5000 mg | Freq: Once | RESPIRATORY_TRACT | Status: AC
  Administered 2018-09-15: 0.5 mg via RESPIRATORY_TRACT

## 2018-09-15 NOTE — Progress Notes (Signed)
Pre visit review using our clinic review tool, if applicable. No additional management support is needed unless otherwise documented below in the visit note. 

## 2018-09-15 NOTE — Progress Notes (Signed)
Chief Complaint  Patient presents with  . Cough   F/u  1. Co cough with creamy phlegm x 1 week with some wheezing no fever or sob. Reviewed CT 04/2018 with +COPD and pt reports he has reflux and lung MD deemed he had scarring in his lung due to reflux in the past. He reports he is feeling better but cough still present and he thinks he has a chest cold    Review of Systems  Constitutional: Negative for fever and weight loss.  Respiratory: Positive for cough, sputum production and wheezing. Negative for shortness of breath.   Cardiovascular: Negative for chest pain.  Gastrointestinal: Negative for abdominal pain.   Past Medical History:  Diagnosis Date  . Cancer ([place]) 1965   melanoma, LNs resected  . COPD (chronic obstructive pulmonary disease) ([place])   . Infectious colitis 2000   requiring hospitalization  . Rosacea    Past Surgical History:  Procedure Laterality Date  . MELANOMA EXCISION WITH SENTINEL LYMPH NODE BIOPSY    . SOFT TISSUE TUMOR RESECTION  1985   Family History  Problem Relation Age of Onset  . Mental illness Mother   . Heart failure Mother   . Heart failure Father   . Rheumatic fever Sister   . Cancer Neg Hx   . Drug abuse Neg Hx    Social History   Socioeconomic History  . Marital status: Married    Spouse name: Not on file  . Number of children: Not on file  . Years of education: Not on file  . Highest education level: Not on file  Occupational History  . Not on file  Social Needs  . Financial resource strain: Not hard at all  . Food insecurity:    Worry: Never true    Inability: Never true  . Transportation needs:    Medical: No    Non-medical: No  Tobacco Use  . Smoking status: Former Smoker    Packs/day: 1.00    Years: 10.00    Pack years: 10.00    Types: Cigarettes    Last attempt to quit: 02/22/1962    Years since quitting: 56.6  . Smokeless tobacco: Never Used  Substance and Sexual Activity  . Alcohol use: Yes    Alcohol/week: 7.0  standard drinks    Types: 7 Standard drinks or equivalent per week    Comment: Occasional wine and beer with meal   . Drug use: No  . Sexual activity: Never  Lifestyle  . Physical activity:    Days per week: Not on file    Minutes per session: Not on file  . Stress: Not on file  Relationships  . Social connections:    Talks on phone: Not on file    Gets together: Not on file    Attends religious service: Not on file    Active member of club or organization: Not on file    Attends meetings of clubs or organizations: Not on file    Relationship status: Not on file  . Intimate partner violence:    Fear of current or ex partner: No    Emotionally abused: No    Physically abused: No    Forced sexual activity: No  Other Topics Concern  . Not on file  Social History Narrative   Married    Lives in [place] >10 years as of 02/2018    Current Meds  Medication Sig  . albuterol (PROVENTIL HFA;VENTOLIN HFA) 108 (90 Base) MCG/ACT  inhaler TAKE 2 PUFFS BY MOUTH INTO LUNGS EVERY 6 HOURS AS NEEDED FOR WHEEZE OR SHORTNESS OF BREATH  . metroNIDAZOLE (METROCREAM) 0.75 % cream Apply topically.  . Multiple Vitamins-Minerals (PRESERVISION AREDS 2) CAPS Take 1 capsule by mouth 2 (two) times daily.  Marland Kitchen omeprazole (PRILOSEC) 20 MG capsule Take 1 capsule (20 mg total) by mouth daily.   Current Facility-Administered Medications for the 09/15/18 encounter (Office Visit) with McLean-Scocuzza, Nino Glow, MD  Medication  . ipratropium-albuterol (DUONEB) 0.5-2.5 (3) MG/3ML nebulizer solution 3 mL   Allergies  Allergen Reactions  . Demerol     Caused patient to feel nauseated  . Meperidine     Other reaction(s): Unknown   Recent Results (from the past 2160 hour(s))  Lipid panel     Status: None   Collection Time: 07/07/18 10:56 AM  Result Value Ref Range   Cholesterol 170 0 - 200 mg/dL    Comment: ATP III Classification       Desirable:  < 200 mg/dL               Borderline High:  200 - 239 mg/dL           High:  > = 240 mg/dL   Triglycerides 111.0 0.0 - 149.0 mg/dL    Comment: Normal:  <150 mg/dLBorderline High:  150 - 199 mg/dL   HDL 55.80 >39.00 mg/dL   VLDL 22.2 0.0 - 40.0 mg/dL   LDL Cholesterol 92 0 - 99 mg/dL   Total CHOL/HDL Ratio 3     Comment:                Men          Women1/2 Average Risk     3.4          3.3Average Risk          5.0          4.42X Average Risk          9.6          7.13X Average Risk          15.0          11.0                       NonHDL 114.46     Comment: NOTE:  Non-HDL goal should be 30 mg/dL higher than patient's LDL goal (i.e. LDL goal of < 70 mg/dL, would have non-HDL goal of < 100 mg/dL)  Comprehensive metabolic panel     Status: Abnormal   Collection Time: 07/07/18 10:56 AM  Result Value Ref Range   Sodium 138 135 - 145 mEq/L   Potassium 4.7 3.5 - 5.1 mEq/L   Chloride 102 96 - 112 mEq/L   CO2 29 19 - 32 mEq/L   Glucose, Bld 115 (H) 70 - 99 mg/dL   BUN 19 6 - 23 mg/dL   Creatinine, Ser 1.22 0.40 - 1.50 mg/dL   Total Bilirubin 0.9 0.2 - 1.2 mg/dL   Alkaline Phosphatase 73 39 - 117 U/L   AST 15 0 - 37 U/L   ALT 17 0 - 53 U/L   Total Protein 7.0 6.0 - 8.3 g/dL   Albumin 4.4 3.5 - 5.2 g/dL   Calcium 9.5 8.4 - 10.5 mg/dL   GFR 59.55 (L) >60.00 mL/min  TSH     Status: None   Collection Time: 07/07/18 10:56 AM  Result Value Ref Range  TSH 2.89 0.35 - 4.50 uIU/mL  CBC with Differential/Platelet     Status: None   Collection Time: 07/07/18 10:56 AM  Result Value Ref Range   WBC 5.5 4.0 - 10.5 K/uL   RBC 4.76 4.22 - 5.81 Mil/uL   Hemoglobin 15.1 13.0 - 17.0 g/dL   HCT 44.4 39.0 - 52.0 %   MCV 93.3 78.0 - 100.0 fl   MCHC 34.0 30.0 - 36.0 g/dL   RDW 13.7 11.5 - 15.5 %   Platelets 226.0 150.0 - 400.0 K/uL   Neutrophils Relative % 68.6 43.0 - 77.0 %   Lymphocytes Relative 18.6 12.0 - 46.0 %   Monocytes Relative 9.8 3.0 - 12.0 %   Eosinophils Relative 2.1 0.0 - 5.0 %   Basophils Relative 0.9 0.0 - 3.0 %   Neutro Abs 3.8 1.4 - 7.7 K/uL    Lymphs Abs 1.0 0.7 - 4.0 K/uL   Monocytes Absolute 0.5 0.1 - 1.0 K/uL   Eosinophils Absolute 0.1 0.0 - 0.7 K/uL   Basophils Absolute 0.1 0.0 - 0.1 K/uL  VITAMIN D 25 Hydroxy (Vit-D Deficiency, Fractures)     Status: Abnormal   Collection Time: 07/07/18 10:56 AM  Result Value Ref Range   VITD 20.69 (L) 30.00 - 100.00 ng/mL  Vitamin B12     Status: None   Collection Time: 07/07/18 10:56 AM  Result Value Ref Range   Vitamin B-12 270 211 - 911 pg/mL   Objective  Body mass index is 30.72 kg/m. Wt Readings from Last 3 Encounters:  09/15/18 196 lb 1.9 oz (89 kg)  07/07/18 191 lb 12.8 oz (87 kg)  05/18/18 188 lb (85.3 kg)   Temp Readings from Last 3 Encounters:  09/15/18 98.8 F (37.1 C) (Oral)  07/07/18 98.1 F (36.7 C) (Oral)  04/14/18 98.2 F (36.8 C) (Oral)   BP Readings from Last 3 Encounters:  09/15/18 (!) 144/70  07/07/18 136/66  05/18/18 124/72   Pulse Readings from Last 3 Encounters:  09/15/18 89  07/07/18 70  05/18/18 69    Physical Exam  Constitutional: He is oriented to person, place, and time. Vital signs are normal. He appears well-developed and well-nourished. He is cooperative.  HENT:  Head: Normocephalic and atraumatic.  Mouth/Throat: Oropharynx is clear and moist and mucous membranes are normal.  Eyes: Pupils are equal, round, and reactive to light. Conjunctivae are normal.  Cardiovascular: Normal rate, regular rhythm and normal heart sounds.  Pulmonary/Chest: Effort normal. He has wheezes in the left lower field.  Mild wheezing left lower lung    Neurological: He is alert and oriented to person, place, and time. Gait normal.  Skin: Skin is warm, dry and intact.  Psychiatric: He has a normal mood and affect. His speech is normal and behavior is normal. Judgment and thought content normal. Cognition and memory are normal.  Vitals reviewed.   Assessment   1. URI likely exacerbating COPD Plan   2. Augmentin bid x 1 week  Supportive care, cold handout   duoneb x 1 Not given depomedrol 40 x 1 today not wheezing significantly  rec use albuterol inhaler prn    Provider: Dr. Olivia Mackie McLean-Scocuzza-Internal Medicine

## 2018-09-15 NOTE — Patient Instructions (Signed)

## 2018-11-23 ENCOUNTER — Telehealth: Payer: Self-pay

## 2018-11-23 ENCOUNTER — Telehealth: Payer: Self-pay | Admitting: Internal Medicine

## 2018-11-23 NOTE — Telephone Encounter (Signed)
Spoke to patient, he states he has had diarrhea since July but just finally put 2 and 2 together. Wants to know if there is something else he can take for reflux that would not upset his stomach.

## 2018-11-23 NOTE — Telephone Encounter (Signed)
Copied from [place] [id number]. Topic: Appointment Scheduling - Scheduling Inquiry for Clinic >> Nov 23, 2018 12:37 PM Jon Sherman A wrote: Reason for CRM:    Pt called in and wanted to make an appt with Dr Derrel Nip only.  The 1st available was the 1/31 he wanted to see he before then about a med that he was prescribe and would like to talk with her about it.  Please advise   Best number  [phone]

## 2018-11-24 NOTE — Telephone Encounter (Signed)
Would it be okay to schedule pt in an 11:30 or 4:30 available appt slot sometime next week to discuss a medication that he was recently prescribed.

## 2018-11-24 NOTE — Telephone Encounter (Signed)
LM for patient to call back re: pepcid.

## 2018-11-24 NOTE — Telephone Encounter (Signed)
There is over the counter famotidine which he can try. This may have the same potential side effect.

## 2018-11-24 NOTE — Telephone Encounter (Signed)
yes

## 2018-11-25 NOTE — Telephone Encounter (Signed)
Spoke with pt and he stated that he had gotten in touch with the doctor that prescribed the medication and he does not need an appt with Dr. Derrel Nip.

## 2018-11-26 NOTE — Telephone Encounter (Signed)
Pt is aware of below recommendations and voiced his understanding.  Nothing further is needed.  

## 2019-03-23 ENCOUNTER — Ambulatory Visit: Payer: Medicare Other | Admitting: Internal Medicine

## 2019-03-23 ENCOUNTER — Ambulatory Visit: Payer: Medicare Other

## 2019-04-06 ENCOUNTER — Encounter: Payer: Self-pay | Admitting: Internal Medicine

## 2019-04-06 ENCOUNTER — Ambulatory Visit (INDEPENDENT_AMBULATORY_CARE_PROVIDER_SITE_OTHER): Payer: Medicare Other | Admitting: Internal Medicine

## 2019-04-06 ENCOUNTER — Other Ambulatory Visit: Payer: Self-pay

## 2019-04-06 DIAGNOSIS — R000 Tachycardia, unspecified: Secondary | ICD-10-CM | POA: Diagnosis not present

## 2019-04-06 DIAGNOSIS — R05 Cough: Secondary | ICD-10-CM | POA: Diagnosis not present

## 2019-04-06 DIAGNOSIS — R059 Cough, unspecified: Secondary | ICD-10-CM

## 2019-04-06 DIAGNOSIS — Z7189 Other specified counseling: Secondary | ICD-10-CM | POA: Diagnosis not present

## 2019-04-06 NOTE — Progress Notes (Signed)
Telephone Note  This visit type was conducted due to national recommendations for restrictions regarding the COVID-19 pandemic (e.g. social distancing).  This format is felt to be most appropriate for this patient at this time.  All issues noted in this document were discussed and addressed.  No physical exam was performed (except for noted visual exam findings with Video Visits).   I connected with@ on 04/06/19 at  4:00 PM EDT by  telephone and verified that I am speaking with the correct person using two identifiers. Location patient: home Location provider: work or home office Persons participating in the virtual visit: patient, provider  I discussed the limitations, risks, security and privacy concerns of performing an evaluation and management service by telephone and the availability of in person appointments. I also discussed with the patient that there may be a patient responsible charge related to this service. The patient expressed understanding and agreed to proceed.  Reason for visit: follow up on fatigue, sinus tachycardia and CPD  HPI:   85 yr old male with COPD, sinus tachycardia here for follow up .  Feeling good,  Attending small outdoor concerts at [facility] , which is on Lockdown.  Walking for 20 minutes  Daily in good weather.  The patient has no signs or symptoms of COVID 19 infection (fever, cough, sore throat  or shortness of breath beyond what is typical for patient).  Patient denies contact with other persons with the above mentioned symptoms or with anyone confirmed to have COVID 19   Eye exam earlier in the month,  Had temp checked 97.9 has follow up on . 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 Feels his visual field is becoming convex  (like a bow tie)  .  Going up to [place] with family for several weeks.   ROS: See pertinent positives and negatives per HPI.  Past Medical History:  Diagnosis  Date  . Cancer ([place]) 1965   melanoma, LNs resected  . COPD (chronic obstructive pulmonary disease) ([place])   . Infectious colitis 2000   requiring hospitalization  . Rosacea     Past Surgical History:  Procedure Laterality Date  . MELANOMA EXCISION WITH SENTINEL LYMPH NODE BIOPSY    . SOFT TISSUE TUMOR RESECTION  1985    Family History  Problem Relation Age of Onset  . Mental illness Mother   . Heart failure Mother   . Heart failure Father   . Rheumatic fever Sister   . Cancer Neg Hx   . Drug abuse Neg Hx     SOCIAL HX:  reports that he quit smoking about 57 years ago. His smoking use included cigarettes. He has a 10.00 pack-year smoking history. He has never used smokeless tobacco. He reports current alcohol use of about 7.0 standard drinks of alcohol per week. He reports that he does not use drugs.  Current Outpatient Medications:  .  metroNIDAZOLE (METROCREAM) 0.75 % cream, Apply topically., Disp: , Rfl:  .  Multiple Vitamins-Minerals (PRESERVISION AREDS 2) CAPS, Take 1 capsule by mouth 2 (two) times daily., Disp: , Rfl:   Current Facility-Administered Medications:  .  ipratropium-albuterol (DUONEB) 0.5-2.5 (3) MG/3ML nebulizer solution 3 mL, 3 mL, Nebulization, Q6H, McLean-Scocuzza, Nino Glow, MD, 3 mL at 02/19/18 0915  EXAM: . General impression: alert, cooperative and articulate.  No signs of being in distress  Lungs: speech is fluent sentence length suggests that patient is not short of  breath and not punctuated by cough, sneezing or sniffing. Marland Kitchen   Psych: affect normal.  speech is articulate and non pressured .  Denies suicidal thoughts   I discussed the assessment and treatment plan with the patient. The patient was provided an opportunity to ask questions and all were answered. The patient agreed with the plan and demonstrated an understanding of the instructions.   The patient was advised to call back or seek an in-person evaluation if the symptoms worsen or if the  condition fails to improve as anticipated.  I provided 15 minutes of non-face-to-face time during this encounter.   Crecencio Mc, MD

## 2019-04-09 DIAGNOSIS — Z7189 Other specified counseling: Secondary | ICD-10-CM | POA: Insufficient documentation

## 2019-04-09 NOTE — Assessment & Plan Note (Signed)
Educated patient on the signs and symptoms of COVID-19 infection and ways to avoid the viral infection including washing hands frequently with soap and water,  using hand sanitizer if unable to wash, avoiding touching face,  staying at home and limiting visitors,  and avoiding contact with people coming in and out of home.  Reminded patient to call office with questions/concerns.  The importance of social distancing was discussed today 

## 2019-04-09 NOTE — Assessment & Plan Note (Signed)
Due to GERD.  symptoms have resolved.

## 2019-04-09 NOTE — Assessment & Plan Note (Signed)
Currently asymptomatic without medication

## 2019-06-21 ENCOUNTER — Encounter: Payer: Self-pay | Admitting: *Deleted

## 2019-06-21 ENCOUNTER — Other Ambulatory Visit: Payer: Self-pay

## 2019-06-22 NOTE — Discharge Instructions (Signed)
General Anesthesia, Adult, Care After °This sheet gives you information about how to care for yourself after your procedure. Your health care provider may also give you more specific instructions. If you have problems or questions, contact your health care provider. °What can I expect after the procedure? °After the procedure, the following side effects are common: °· Pain or discomfort at the IV site. °· Nausea. °· Vomiting. °· Sore throat. °· Trouble concentrating. °· Feeling cold or chills. °· Weak or tired. °· Sleepiness and fatigue. °· Soreness and body aches. These side effects can affect parts of the body that were not involved in surgery. °Follow these instructions at home: ° °For at least 24 hours after the procedure: °· Have a responsible adult stay with you. It is important to have someone help care for you until you are awake and alert. °· Rest as needed. °· Do not: °? Participate in activities in which you could fall or become injured. °? Drive. °? Use heavy machinery. °? Drink alcohol. °? Take sleeping pills or medicines that cause drowsiness. °? Make important decisions or sign legal documents. °? Take care of children on your own. °Eating and drinking °· Follow any instructions from your health care provider about eating or drinking restrictions. °· When you feel hungry, start by eating small amounts of foods that are soft and easy to digest (bland), such as toast. Gradually return to your regular diet. °· Drink enough fluid to keep your urine pale yellow. °· If you vomit, rehydrate by drinking water, juice, or clear broth. °General instructions °· If you have sleep apnea, surgery and certain medicines can increase your risk for breathing problems. Follow instructions from your health care provider about wearing your sleep device: °? Anytime you are sleeping, including during daytime naps. °? While taking prescription pain medicines, sleeping medicines, or medicines that make you drowsy. °· Return to  your normal activities as told by your health care provider. Ask your health care provider what activities are safe for you. °· Take over-the-counter and prescription medicines only as told by your health care provider. °· If you smoke, do not smoke without supervision. °· Keep all follow-up visits as told by your health care provider. This is important. °Contact a health care provider if: °· You have nausea or vomiting that does not get better with medicine. °· You cannot eat or drink without vomiting. °· You have pain that does not get better with medicine. °· You are unable to pass urine. °· You develop a skin rash. °· You have a fever. °· You have redness around your IV site that gets worse. °Get help right away if: °· You have difficulty breathing. °· You have chest pain. °· You have blood in your urine or stool, or you vomit blood. °Summary °· After the procedure, it is common to have a sore throat or nausea. It is also common to feel tired. °· Have a responsible adult stay with you for the first 24 hours after general anesthesia. It is important to have someone help care for you until you are awake and alert. °· When you feel hungry, start by eating small amounts of foods that are soft and easy to digest (bland), such as toast. Gradually return to your regular diet. °· Drink enough fluid to keep your urine pale yellow. °· Return to your normal activities as told by your health care provider. Ask your health care provider what activities are safe for you. °This information is not   intended to replace advice given to you by your health care provider. Make sure you discuss any questions you have with your health care provider. °Document Released: 02/09/2001 Document Revised: 11/06/2017 Document Reviewed: 06/19/2017 °Elsevier Patient Education © 2020 Elsevier Inc. °Cataract Surgery, Care After °This sheet gives you information about how to care for yourself after your procedure. Your health care provider may also  give you more specific instructions. If you have problems or questions, contact your health care provider. °What can I expect after the procedure? °After the procedure, it is common to have: °· Itching. °· Discomfort. °· Fluid discharge. °· Sensitivity to light and to touch. °· Bruising in or around the eye. °· Mild blurred vision. °Follow these instructions at home: °Eye care ° °· Do not touch or rub your eyes. °· Protect your eyes as told by your health care provider. You may be told to wear a protective eye shield or sunglasses. °· Do not put a contact lens into the affected eye or eyes until your health care provider approves. °· Keep the area around your eye clean and dry: °? Avoid swimming. °? Do not allow water to hit you directly in the face while showering. °? Keep soap and shampoo out of your eyes. °· Check your eye every day for signs of infection. Watch for: °? Redness, swelling, or pain. °? Fluid, blood, or pus. °? Warmth. °? A bad smell. °? Vision that is getting worse. °? Sensitivity that is getting worse. °Activity °· Do not drive for 24 hours if you were given a sedative during your procedure. °· Avoid strenuous activities, such as playing contact sports, for as long as told by your health care provider. °· Do not drive or use heavy machinery until your health care provider approves. °· Do not bend or lift heavy objects. Bending increases pressure in the eye. You can walk, climb stairs, and do light household chores. °· Ask your health care provider when you can return to work. If you work in a dusty environment, you may be advised to wear protective eyewear for a period of time. °General instructions °· Take or apply over-the-counter and prescription medicines only as told by your health care provider. This includes eye drops. °· Keep all follow-up visits as told by your health care provider. This is important. °Contact a health care provider if: °· You have increased bruising around your  eye. °· You have pain that is not helped with medicine. °· You have a fever. °· You have redness, swelling, or pain in your eye. °· You have fluid, blood, or pus coming from your incision. °· Your vision gets worse. °· Your sensitivity to light gets worse. °Get help right away if: °· You have sudden loss of vision. °· You see flashes of light or spots (floaters). °· You have severe eye pain. °· You develop nausea or vomiting. °Summary °· After your procedure, it is common to have itching, discomfort, bruising, fluid discharge, or sensitivity to light. °· Follow instructions from your health care provider about caring for your eye after the procedure. °· Do not rub your eye after the procedure. You may need to wear eye protection or sunglasses. Do not wear contact lenses. Keep the area around your eye clean and dry. °· Avoid activities that require a lot of effort. These include playing sports and lifting heavy objects. °· Contact a health care provider if you have increased bruising, pain that does not go away, or a fever. Get   help right away if you suddenly lose your vision, see flashes of light or spots, or have severe pain in the eye. °This information is not intended to replace advice given to you by your health care provider. Make sure you discuss any questions you have with your health care provider. °Document Released: 05/23/2005 Document Revised: 05/03/2018 Document Reviewed: 05/03/2018 °Elsevier Patient Education © 2020 Elsevier Inc. ° °

## 2019-06-24 ENCOUNTER — Other Ambulatory Visit
Admission: RE | Admit: 2019-06-24 | Discharge: 2019-06-24 | Disposition: A | Discharge status: Home / Self Care | Payer: Medicare Other | Source: Ambulatory Visit | Attending: Ophthalmology | Admitting: Ophthalmology

## 2019-06-24 ENCOUNTER — Other Ambulatory Visit: Payer: Self-pay

## 2019-06-24 DIAGNOSIS — Z01812 Encounter for preprocedural laboratory examination: Secondary | ICD-10-CM | POA: Insufficient documentation

## 2019-06-24 DIAGNOSIS — Z20828 Contact with and (suspected) exposure to other viral communicable diseases: Secondary | ICD-10-CM | POA: Insufficient documentation

## 2019-06-25 LAB — SARS CORONAVIRUS 2 (TAT 6-24 HRS): SARS Coronavirus 2: NEGATIVE

## 2019-06-29 ENCOUNTER — Ambulatory Visit: Payer: Medicare Other | Admitting: Anesthesiology

## 2019-06-29 ENCOUNTER — Ambulatory Visit
Admission: RE | Admit: 2019-06-29 | Discharge: 2019-06-29 | Disposition: A | Discharge status: Home / Self Care | Payer: Medicare Other | Attending: Ophthalmology | Admitting: Ophthalmology

## 2019-06-29 ENCOUNTER — Ambulatory Visit (INDEPENDENT_AMBULATORY_CARE_PROVIDER_SITE_OTHER): Payer: Medicare Other

## 2019-06-29 ENCOUNTER — Other Ambulatory Visit: Payer: Self-pay

## 2019-06-29 ENCOUNTER — Encounter
Admission: RE | Disposition: A | Discharge status: Home / Self Care | Payer: Self-pay | Source: Home / Self Care | Attending: Ophthalmology

## 2019-06-29 DIAGNOSIS — H2511 Age-related nuclear cataract, right eye: Secondary | ICD-10-CM | POA: Diagnosis not present

## 2019-06-29 DIAGNOSIS — Z885 Allergy status to narcotic agent status: Secondary | ICD-10-CM | POA: Diagnosis not present

## 2019-06-29 DIAGNOSIS — Z79899 Other long term (current) drug therapy: Secondary | ICD-10-CM | POA: Insufficient documentation

## 2019-06-29 DIAGNOSIS — Z87891 Personal history of nicotine dependence: Secondary | ICD-10-CM | POA: Insufficient documentation

## 2019-06-29 DIAGNOSIS — Z0000 Encounter for general adult medical examination without abnormal findings: Secondary | ICD-10-CM | POA: Diagnosis not present

## 2019-06-29 DIAGNOSIS — J449 Chronic obstructive pulmonary disease, unspecified: Secondary | ICD-10-CM | POA: Diagnosis not present

## 2019-06-29 HISTORY — DX: Dizziness and giddiness: R42

## 2019-06-29 HISTORY — PX: CATARACT EXTRACTION W/PHACO: SHX586

## 2019-06-29 SURGERY — PHACOEMULSIFICATION, CATARACT, WITH IOL INSERTION
Anesthesia: Monitor Anesthesia Care | Site: Eye | Laterality: Right

## 2019-06-29 MED ORDER — MOXIFLOXACIN HCL 0.5 % OP SOLN
1.0000 [drp] | OPHTHALMIC | Status: DC | PRN
  Administered 2019-06-29 (×3): 1 [drp] via OPHTHALMIC

## 2019-06-29 MED ORDER — MIDAZOLAM HCL 2 MG/2ML IJ SOLN
INTRAMUSCULAR | Status: DC | PRN
  Administered 2019-06-29: 1 mg via INTRAVENOUS

## 2019-06-29 MED ORDER — CEFUROXIME OPHTHALMIC INJECTION 1 MG/0.1 ML
INJECTION | OPHTHALMIC | Status: DC | PRN
  Administered 2019-06-29: 0.1 mL via INTRACAMERAL

## 2019-06-29 MED ORDER — ARMC OPHTHALMIC DILATING DROPS
1.0000 "application " | OPHTHALMIC | Status: DC | PRN
  Administered 2019-06-29 (×3): 1 via OPHTHALMIC

## 2019-06-29 MED ORDER — ACETAMINOPHEN 325 MG PO TABS: 325.0000 mg | ORAL_TABLET | Freq: Once | ORAL | Status: DC

## 2019-06-29 MED ORDER — TETRACAINE HCL 0.5 % OP SOLN
1.0000 [drp] | OPHTHALMIC | Status: DC | PRN
  Administered 2019-06-29 (×3): 1 [drp] via OPHTHALMIC

## 2019-06-29 MED ORDER — NA HYALUR & NA CHOND-NA HYALUR 0.4-0.35 ML IO KIT
PACK | INTRAOCULAR | Status: DC | PRN
  Administered 2019-06-29: 1 mL via INTRAOCULAR

## 2019-06-29 MED ORDER — EPINEPHRINE PF 1 MG/ML IJ SOLN
INTRAOCULAR | Status: DC | PRN
  Administered 2019-06-29: 14:00:00 76 mL via OPHTHALMIC

## 2019-06-29 MED ORDER — LACTATED RINGERS IV SOLN: INTRAVENOUS | Status: DC

## 2019-06-29 MED ORDER — LIDOCAINE HCL (PF) 2 % IJ SOLN
INTRAOCULAR | Status: DC | PRN
  Administered 2019-06-29: 1 mL

## 2019-06-29 MED ORDER — BRIMONIDINE TARTRATE-TIMOLOL 0.2-0.5 % OP SOLN
OPHTHALMIC | Status: DC | PRN
  Administered 2019-06-29: 1 [drp] via OPHTHALMIC

## 2019-06-29 MED ORDER — FENTANYL CITRATE (PF) 100 MCG/2ML IJ SOLN
INTRAMUSCULAR | Status: DC | PRN
  Administered 2019-06-29: 50 ug via INTRAVENOUS

## 2019-06-29 MED ORDER — ACETAMINOPHEN 160 MG/5ML PO SOLN: 325.0000 mg | Freq: Once | ORAL | Status: DC

## 2019-06-29 SURGICAL SUPPLY — 16 items
CANNULA ANT/CHMB 27G (MISCELLANEOUS) ×1 IMPLANT
CANNULA ANT/CHMB 27GA (MISCELLANEOUS) ×3 IMPLANT
GLOVE SURG LX 7.5 STRW (GLOVE) ×2
GLOVE SURG LX STRL 7.5 STRW (GLOVE) ×1 IMPLANT
GLOVE SURG TRIUMPH 8.0 PF LTX (GLOVE) ×3 IMPLANT
GOWN STRL REUS W/ TWL LRG LVL3 (GOWN DISPOSABLE) ×2 IMPLANT
GOWN STRL REUS W/TWL LRG LVL3 (GOWN DISPOSABLE) ×4
LENS IOL TECNIS ITEC 22.0 (Intraocular Lens) ×2 IMPLANT
MARKER SKIN DUAL TIP RULER LAB (MISCELLANEOUS) ×3 IMPLANT
PACK CATARACT BRASINGTON (MISCELLANEOUS) ×3 IMPLANT
PACK EYE AFTER SURG (MISCELLANEOUS) ×3 IMPLANT
PACK OPTHALMIC (MISCELLANEOUS) ×3 IMPLANT
SYR 3ML LL SCALE MARK (SYRINGE) ×3 IMPLANT
SYR TB 1ML LUER SLIP (SYRINGE) ×3 IMPLANT
WATER STERILE IRR 500ML POUR (IV SOLUTION) ×3 IMPLANT
WIPE NON LINTING 3.25X3.25 (MISCELLANEOUS) ×3 IMPLANT

## 2019-06-29 NOTE — Transfer of Care (Signed)
Immediate Anesthesia Transfer of Care Note  Patient: Jon Sherman.  Procedure(s) Performed: CATARACT EXTRACTION PHACO AND INTRAOCULAR LENS PLACEMENT (IOC) RIGHT (Right Eye)  Patient Location: PACU  Anesthesia Type: MAC  Level of Consciousness: awake, alert  and patient cooperative  Airway and Oxygen Therapy: Patient Spontanous Breathing and Patient connected to supplemental oxygen  Post-op Assessment: Post-op Vital signs reviewed, Patient's Cardiovascular Status Stable, Respiratory Function Stable, Patent Airway and No signs of Nausea or vomiting  Post-op Vital Signs: Reviewed and stable  Complications: No apparent anesthesia complications

## 2019-06-29 NOTE — Progress Notes (Addendum)
Subjective:   Jon Sherman. is a 85 y.o. male who presents for Medicare Annual/Subsequent preventive examination.  Review of Systems:  No ROS.  Medicare Wellness Virtual Visit.  Visual/audio telehealth visit, UTA vital signs.   See social history for additional risk factors.   Cardiac Risk Factors include: advanced age (>80men, >25 women);male gender     Objective:    Vitals: There were no vitals taken for this visit.  There is no height or weight on file to calculate BMI.  Advanced Directives 06/29/2019 03/19/2018 03/18/2017 03/05/2016  Does Patient Have a Medical Advance Directive? Yes Yes Yes Yes  Type of Paramedic of [place];Living will [place];Living will [place];Living will  Does patient want to make changes to medical advance directive? No - Patient declined No - Patient declined No - Patient declined -  Copy of [place] in Chart? No - copy requested No - copy requested No - copy requested No - copy requested    Tobacco Social History   Tobacco Use  Smoking Status Former Smoker  . Packs/day: 1.00  . Years: 10.00  . Pack years: 10.00  . Types: Cigarettes  . Quit date: 02/22/1962  . Years since quitting: 57.3  Smokeless Tobacco Never Used     Counseling given: Not Answered   Clinical Intake:  Pre-visit preparation completed: Yes        Diabetes: No  How often do you need to have someone help you when you read instructions, pamphlets, or other written materials from your doctor or pharmacy?: 1 - Never  Interpreter Needed?: No     Past Medical History:  Diagnosis Date  . Cancer ([place]) 1965   melanoma, LNs resected  . COPD (chronic obstructive pulmonary disease) ([place])   . Infectious colitis 2000   requiring hospitalization  . Rosacea   . Vertigo    1 episode (2019)   Past Surgical History:  Procedure Laterality Date  . MELANOMA  EXCISION WITH SENTINEL LYMPH NODE BIOPSY    . SOFT TISSUE TUMOR RESECTION  1985   Family History  Problem Relation Age of Onset  . Mental illness Mother   . Heart failure Mother   . Heart failure Father   . Rheumatic fever Sister   . Cancer Neg Hx   . Drug abuse Neg Hx    Social History   Socioeconomic History  . Marital status: Married    Spouse name: Not on file  . Number of children: Not on file  . Years of education: Not on file  . Highest education level: Not on file  Occupational History  . Not on file  Social Needs  . Financial resource strain: Not hard at all  . Food insecurity    Worry: Never true    Inability: Never true  . Transportation needs    Medical: No    Non-medical: No  Tobacco Use  . Smoking status: Former Smoker    Packs/day: 1.00    Years: 10.00    Pack years: 10.00    Types: Cigarettes    Quit date: 02/22/1962    Years since quitting: 57.3  . Smokeless tobacco: Never Used  Substance and Sexual Activity  . Alcohol use: Yes    Alcohol/week: 7.0 standard drinks    Types: 7 Standard drinks or equivalent per week    Comment: Occasional wine and beer with meal   . Drug use:  No  . Sexual activity: Never  Lifestyle  . Physical activity    Days per week: Not on file    Minutes per session: Not on file  . Stress: Not at all  Relationships  . Social Herbalist on phone: Not on file    Gets together: Not on file    Attends religious service: Not on file    Active member of club or organization: Not on file    Attends meetings of clubs or organizations: Not on file    Relationship status: Not on file  Other Topics Concern  . Not on file  Social History Narrative   Married    Lives in [place] >10 years as of 02/2018     Outpatient Encounter Medications as of 06/29/2019  Medication Sig  . metroNIDAZOLE (METROCREAM) 0.75 % cream Apply topically.  . Multiple Vitamins-Minerals (PRESERVISION AREDS 2) CAPS Take 1 capsule by mouth 2  (two) times daily.  . metroNIDAZOLE (METROGEL) 0.75 % gel APPLY TO THE AFFECTED AREA TWICE A DAY   No facility-administered encounter medications on file as of 06/29/2019.     Activities of Daily Living In your present state of health, do you have any difficulty performing the following activities: 06/29/2019  Hearing? N  Vision? Y  Comment Macular degeneration R eye, Cataract surgery today R eye. Wears glasses.  Difficulty concentrating or making decisions? N  Walking or climbing stairs? N  Comment Handrails in use when climbing stairs  Dressing or bathing? N  Doing errands, shopping? N  Preparing Food and eating ? N  Using the Toilet? N  In the past six months, have you accidently leaked urine? N  Do you have problems with loss of bowel control? N  Managing your Medications? N  Managing your Finances? N  Housekeeping or managing your Housekeeping? Y  Comment Maid assist  Some recent data might be hidden    Patient Care Team: Crecencio Mc, MD as PCP - General (Internal Medicine) Crecencio Mc, MD (Internal Medicine)   Assessment:   This is a routine wellness examination for Felicia.  I connected with patient 06/29/19 at  9:30 AM EDT by an audio enabled telemedicine application and verified that I am speaking with the correct person using two identifiers. Patient stated full name and DOB. Patient gave permission to continue with virtual visit. Patient's location was at home and Nurse's location was at [place] office.   Health Screenings  Colonoscopy - 05/2013 Glaucoma -none Macular degeneration- yes, R eye Hearing -demonstrates normal hearing during visit. Cholesterol - 06/2018 (170) Dental- UTD Vision- visits within the last 12 months.  Social  Alcohol intake - yes      Smoking history- former   Smokers in home? none Illicit drug use? none Exercise - walking, no routine Diet - regular Sexually Active -not currently BMI- discussed the importance of a healthy diet,  water intake and the benefits of aerobic exercise.  Educational material provided.   Safety  Patient feels safe at home- yes Patient does have smoke detectors at home- yes Patient does wear sunscreen or protective clothing when in direct sunlight -yes Patient does wear seat belt when in a moving vehicle -yes Patient drives- yes  [id number] precautions and sickness symptoms discussed. Wears mask when in public.   Activities of Daily Living Patient denies needing assistance with: driving, household chores, feeding themselves, getting from bed to chair, getting to the toilet, bathing/showering, dressing, managing money, or  preparing meals.  No new identified risk were noted.    Depression Screen Patient denies losing interest in daily life, feeling hopeless, or crying easily over simple problems.   Medication-taking as directed and without issues.   Memory Screen Patient is alert.  Patient denies difficulty focusing or concentrating. Correctly identified the president of the [place], season and recall. Patient likes to read and watches the business channels on television  for brain stimulation.  Immunizations The following Immunizations were discussed: Influenza, shingles, pneumonia, and tetanus.   Other Providers Patient Care Team: Crecencio Mc, MD as PCP - General (Internal Medicine) Crecencio Mc, MD (Internal Medicine)  Exercise Activities and Dietary recommendations Current Exercise Habits: Home exercise routine, Type of exercise: walking, Intensity: Mild  Goals    . Prevent falls     Remove throw rugs from walkways if possible Ensure non-slip padding is placed under throw rugs in the home       Fall Risk Fall Risk  06/29/2019 04/06/2019 09/15/2018 03/19/2018 03/18/2017  Falls in the past year? 0 1 No No No  Number falls in past yr: - 0 - - -  Injury with Fall? - 0 - - -  Is the patient's home free of loose throw rugs in walkways, pet beds, electrical cords, etc? No.  Discussed.       Grab bars in the bathroom? yes      Handrails on the stairs? yes      Adequate lighting? yes    Timed Get Up and Go Performed: no, virtual visit.  Depression Screen PHQ 2/9 Scores 06/29/2019 09/15/2018 03/19/2018 03/18/2017  PHQ - 2 Score 0 0 0 0  PHQ- 9 Score - - - 0    Cognitive Function MMSE - Mini Mental State Exam 03/05/2016  Orientation to time 5  Orientation to Place 5  Registration 3  Attention/ Calculation 5  Recall 3  Language- name 2 objects 2  Language- repeat 1  Language- follow 3 step command 3  Language- read & follow direction 1  Write a sentence 1  Copy design 1  Total score 30     6CIT Screen 06/29/2019 03/19/2018 03/18/2017  What Year? 0 points 0 points 0 points  What month? 0 points 0 points 0 points  What time? 0 points 0 points 0 points  Count back from 20 0 points 0 points 0 points  Months in reverse 0 points 0 points 0 points  Repeat phrase 0 points 0 points 0 points  Total Score 0 0 0    Immunization History  Administered Date(s) Administered  . Influenza Split 09/12/2013  . Influenza-Unspecified 09/17/2012, 09/15/2014  . Pneumococcal Conjugate-13 11/20/2014  . Pneumococcal Polysaccharide-23 11/20/2005  . Tdap 02/22/2013  . Zoster Recombinat (Shingrix) 02/11/2018   Screening Tests Health Maintenance  Topic Date Due  . INFLUENZA VACCINE  06/18/2019  . TETANUS/TDAP  02/23/2023  . PNA vac Low Risk Adult  Completed       Plan:    End of life planning; Advance aging; Advanced directives discussed.  Copy of current HCPOA/Living Will requested.    CPE scheduled 07/13/19.  Consent to schedule AWV 2021.  I have personally reviewed and noted the following in the patient's chart:   . Medical and social history . Use of alcohol, tobacco or illicit drugs  . Current medications and supplements . Functional ability and status . Nutritional status . Physical activity . Advanced directives . List of other physicians .  Hospitalizations, surgeries,  and ER visits in previous 12 months . Vitals . Screenings to include cognitive, depression, and falls . Referrals and appointments  In addition, I have reviewed and discussed with patient certain preventive protocols, quality metrics, and best practice recommendations. A written personalized care plan for preventive services as well as general preventive health recommendations were provided to patient.     OBrien-Blaney, Mirela Parsley L, LPN  [id number]   I have reviewed the above information and agree with above.   Deborra Medina, MD

## 2019-06-29 NOTE — Anesthesia Preprocedure Evaluation (Signed)
Anesthesia Evaluation  Patient identified by MRN, date of birth, ID band Patient awake    Reviewed: Allergy & Precautions, H&P , NPO status , Patient's Chart, lab work & pertinent test results  Airway Mallampati: II  TM Distance: >3 FB Neck ROM: full    Dental no notable dental hx.    Pulmonary COPD, former smoker,    Pulmonary exam normal breath sounds clear to auscultation       Cardiovascular Normal cardiovascular exam Rhythm:regular Rate:Normal     Neuro/Psych    GI/Hepatic   Endo/Other    Renal/GU      Musculoskeletal   Abdominal   Peds  Hematology   Anesthesia Other Findings   Reproductive/Obstetrics                             Anesthesia Physical Anesthesia Plan  ASA: II  Anesthesia Plan: MAC   Post-op Pain Management:    Induction:   PONV Risk Score and Plan: 1 and Midazolam, TIVA and Treatment may vary due to age or medical condition  Airway Management Planned:   Additional Equipment:   Intra-op Plan:   Post-operative Plan:   Informed Consent: I have reviewed the patients History and Physical, chart, labs and discussed the procedure including the risks, benefits and alternatives for the proposed anesthesia with the patient or authorized representative who has indicated his/her understanding and acceptance.       Plan Discussed with: CRNA  Anesthesia Plan Comments:         Anesthesia Quick Evaluation

## 2019-06-29 NOTE — Op Note (Signed)
LOCATION:  [place]   PREOPERATIVE DIAGNOSIS:    Nuclear sclerotic cataract right eye. H25.11   POSTOPERATIVE DIAGNOSIS:  Nuclear sclerotic cataract right eye.     PROCEDURE:  Phacoemusification with posterior chamber intraocular lens placement of the right eye   LENS:   Implant Name Type Inv. Item Serial No. Manufacturer Lot No. LRB No. Used Action  LENS IOL DIOP 22.0 - [id number] Intraocular Lens LENS IOL DIOP 22.0 [id number]  Right 1 Implanted        ULTRASOUND TIME: 27 % of 1 minutes, 38 seconds.  CDE 26.0   SURGEON:  Wyonia Hough, MD   ANESTHESIA:  Topical with tetracaine drops and 2% Xylocaine jelly, augmented with 1% preservative-free intracameral lidocaine.    COMPLICATIONS:  None.   DESCRIPTION OF PROCEDURE:  The patient was identified in the holding room and transported to the operating room and placed in the supine position under the operating microscope.  The right eye was identified as the operative eye and it was prepped and draped in the usual sterile ophthalmic fashion.   A 1 millimeter clear-corneal paracentesis was made at the 12:00 position.  0.5 ml of preservative-free 1% lidocaine was injected into the anterior chamber. The anterior chamber was filled with Viscoat viscoelastic.  A 2.4 millimeter keratome was used to make a near-clear corneal incision at the 9:00 position.  A curvilinear capsulorrhexis was made with a cystotome and capsulorrhexis forceps.  Balanced salt solution was used to hydrodissect and hydrodelineate the nucleus.   Phacoemulsification was then used in stop and chop fashion to remove the lens nucleus and epinucleus.  The remaining cortex was then removed using the irrigation and aspiration handpiece. Provisc was then placed into the capsular bag to distend it for lens placement.  A lens was then injected into the capsular bag.  The remaining viscoelastic was aspirated.   Wounds were hydrated with balanced salt solution.   The anterior chamber was inflated to a physiologic pressure with balanced salt solution.  No wound leaks were noted. Cefuroxime 0.1 ml of a 10mg /ml solution was injected into the anterior chamber for a dose of 1 mg of intracameral antibiotic at the completion of the case.   Timolol and Brimonidine drops were applied to the eye.  The patient was taken to the recovery room in stable condition without complications of anesthesia or surgery.   [place] 06/29/2019, 1:43 PM

## 2019-06-29 NOTE — Anesthesia Postprocedure Evaluation (Signed)
Anesthesia Post Note  Patient: Jon Sherman.  Procedure(s) Performed: CATARACT EXTRACTION PHACO AND INTRAOCULAR LENS PLACEMENT (IOC) RIGHT (Right Eye)  Patient location during evaluation: PACU Anesthesia Type: MAC Level of consciousness: awake and alert and oriented Pain management: satisfactory to patient Vital Signs Assessment: post-procedure vital signs reviewed and stable Respiratory status: spontaneous breathing, nonlabored ventilation and respiratory function stable Cardiovascular status: blood pressure returned to baseline and stable Postop Assessment: Adequate PO intake and No signs of nausea or vomiting Anesthetic complications: no    Raliegh Ip

## 2019-06-29 NOTE — Anesthesia Procedure Notes (Signed)
Procedure Name: MAC Performed by: Hiliary Osorto, CRNA Pre-anesthesia Checklist: Patient identified, Emergency Drugs available, Suction available, Timeout performed and Patient being monitored Patient Re-evaluated:Patient Re-evaluated prior to induction Oxygen Delivery Method: Nasal cannula Placement Confirmation: positive ETCO2       

## 2019-06-29 NOTE — Patient Instructions (Addendum)
Jon Sherman , Thank you for taking time to come for your Medicare Wellness Visit. I appreciate your ongoing commitment to your health goals. Please review the following plan we discussed and let me know if I can assist you in the future.   These are the goals we discussed: Goals    . Prevent falls     Remove throw rugs from walkways if possible Ensure non-slip padding is placed under throw rugs in the home       This is a list of the screening recommended for you and due dates:  Health Maintenance  Topic Date Due  . Flu Shot  06/18/2019  . Tetanus Vaccine  02/23/2023  . Pneumonia vaccines  Completed

## 2019-06-29 NOTE — H&P (Signed)

## 2019-06-30 ENCOUNTER — Encounter: Payer: Self-pay | Admitting: Ophthalmology

## 2019-07-13 ENCOUNTER — Encounter: Payer: Medicare Other | Admitting: Internal Medicine

## 2019-07-13 ENCOUNTER — Ambulatory Visit: Payer: Medicare Other

## 2019-08-24 ENCOUNTER — Other Ambulatory Visit: Payer: Self-pay

## 2019-08-26 ENCOUNTER — Encounter: Payer: Self-pay | Admitting: Internal Medicine

## 2019-08-26 ENCOUNTER — Other Ambulatory Visit
Admission: RE | Admit: 2019-08-26 | Discharge: 2019-08-26 | Disposition: A | Discharge status: Home / Self Care | Payer: Medicare Other | Source: Ambulatory Visit | Attending: Ophthalmology | Admitting: Ophthalmology

## 2019-08-26 ENCOUNTER — Other Ambulatory Visit: Payer: Self-pay

## 2019-08-26 ENCOUNTER — Ambulatory Visit (INDEPENDENT_AMBULATORY_CARE_PROVIDER_SITE_OTHER): Payer: Medicare Other | Admitting: Internal Medicine

## 2019-08-26 VITALS — BP 132/68 | HR 74 | Temp 97.2°F | Resp 15 | Ht 69.0 in | Wt 193.2 lb

## 2019-08-26 DIAGNOSIS — Z20828 Contact with and (suspected) exposure to other viral communicable diseases: Secondary | ICD-10-CM | POA: Insufficient documentation

## 2019-08-26 DIAGNOSIS — Z01812 Encounter for preprocedural laboratory examination: Secondary | ICD-10-CM | POA: Diagnosis present

## 2019-08-26 DIAGNOSIS — Z86010 Personal history of colonic polyps: Secondary | ICD-10-CM

## 2019-08-26 DIAGNOSIS — E559 Vitamin D deficiency, unspecified: Secondary | ICD-10-CM

## 2019-08-26 DIAGNOSIS — R5383 Other fatigue: Secondary | ICD-10-CM | POA: Diagnosis not present

## 2019-08-26 DIAGNOSIS — R200 Anesthesia of skin: Secondary | ICD-10-CM | POA: Diagnosis not present

## 2019-08-26 DIAGNOSIS — Z23 Encounter for immunization: Secondary | ICD-10-CM

## 2019-08-26 DIAGNOSIS — E782 Mixed hyperlipidemia: Secondary | ICD-10-CM

## 2019-08-26 DIAGNOSIS — Z0000 Encounter for general adult medical examination without abnormal findings: Secondary | ICD-10-CM

## 2019-08-26 LAB — SARS CORONAVIRUS 2 (TAT 6-24 HRS): SARS Coronavirus 2: NEGATIVE

## 2019-08-26 NOTE — Patient Instructions (Signed)
Health Maintenance After Age 83 After age 83, you are at a higher risk for certain long-term diseases and infections as well as injuries from falls. Falls are a major cause of broken bones and head injuries in people who are older than age 83. Getting regular preventive care can help to keep you healthy and well. Preventive care includes getting regular testing and making lifestyle changes as recommended by your health care provider. Talk with your health care provider about:  Which screenings and tests you should have. A screening is a test that checks for a disease when you have no symptoms.  A diet and exercise plan that is right for you. What should I know about screenings and tests to prevent falls? Screening and testing are the best ways to find a health problem early. Early diagnosis and treatment give you the best chance of managing medical conditions that are common after age 83. Certain conditions and lifestyle choices may make you more likely to have a fall. Your health care provider may recommend:  Regular vision checks. Poor vision and conditions such as cataracts can make you more likely to have a fall. If you wear glasses, make sure to get your prescription updated if your vision changes.  Medicine review. Work with your health care provider to regularly review all of the medicines you are taking, including over-the-counter medicines. Ask your health care provider about any side effects that may make you more likely to have a fall. Tell your health care provider if any medicines that you take make you feel dizzy or sleepy.  Osteoporosis screening. Osteoporosis is a condition that causes the bones to get weaker. This can make the bones weak and cause them to break more easily.  Blood pressure screening. Blood pressure changes and medicines to control blood pressure can make you feel dizzy.  Strength and balance checks. Your health care provider may recommend certain tests to check your  strength and balance while standing, walking, or changing positions.  Foot health exam. Foot pain and numbness, as well as not wearing proper footwear, can make you more likely to have a fall.  Depression screening. You may be more likely to have a fall if you have a fear of falling, feel emotionally low, or feel unable to do activities that you used to do.  Alcohol use screening. Using too much alcohol can affect your balance and may make you more likely to have a fall. What actions can I take to lower my risk of falls? General instructions  Talk with your health care provider about your risks for falling. Tell your health care provider if: ? You fall. Be sure to tell your health care provider about all falls, even ones that seem minor. ? You feel dizzy, sleepy, or off-balance.  Take over-the-counter and prescription medicines only as told by your health care provider. These include any supplements.  Eat a healthy diet and maintain a healthy weight. A healthy diet includes low-fat dairy products, low-fat (lean) meats, and fiber from whole grains, beans, and lots of fruits and vegetables. Home safety  Remove any tripping hazards, such as rugs, cords, and clutter.  Install safety equipment such as grab bars in bathrooms and safety rails on stairs.  Keep rooms and walkways well-lit. Activity   Follow a regular exercise program to stay fit. This will help you maintain your balance. Ask your health care provider what types of exercise are appropriate for you.  If you need a cane or   walker, use it as recommended by your health care provider.  Wear supportive shoes that have nonskid soles. Lifestyle  Do not drink alcohol if your health care provider tells you not to drink.  If you drink alcohol, limit how much you have: ? 0-1 drink a day for women. ? 0-2 drinks a day for men.  Be aware of how much alcohol is in your drink. In the [place] one drink equals one typical bottle of beer (12  oz), one-half glass of wine (5 oz), or one shot of hard liquor (1 oz).  Do not use any products that contain nicotine or tobacco, such as cigarettes and e-cigarettes. If you need help quitting, ask your health care provider. Summary  Having a healthy lifestyle and getting preventive care can help to protect your health and wellness after age 83.  Screening and testing are the best way to find a health problem early and help you avoid having a fall. Early diagnosis and treatment give you the best chance for managing medical conditions that are more common for people who are older than age 83.  Falls are a major cause of broken bones and head injuries in people who are older than age 83. Take precautions to prevent a fall at home.  Work with your health care provider to learn what changes you can make to improve your health and wellness and to prevent falls. This information is not intended to replace advice given to you by your health care provider. Make sure you discuss any questions you have with your health care provider. Document Released: 09/16/2017 Document Revised: 02/24/2019 Document Reviewed: 09/16/2017 Elsevier Patient Education  2020 [employer]  

## 2019-08-26 NOTE — Progress Notes (Signed)
Patient ID: Jon Sherman., male    DOB: 1927-11-24  Age: 85 y.o. MRN: [id number]  The patient is here for annual preventive examination and management of other chronic and acute problems.   The risk factors are reflected in the social history.  The roster of all physicians providing medical care to patient - is listed in the Snapshot section of the chart.  Activities of daily living:  The patient is 100% independent in all ADLs: dressing, toileting, feeding as well as independent mobility  Home safety : The patient has smoke detectors in the home. They wear seatbelts.  There are no firearms at home. There is no violence in the home.   There is no risks for hepatitis, STDs or HIV. There is no   history of blood transfusion. They have no travel history to infectious disease endemic areas of the world.  The patient has seen their dentist in the last six month. They have seen their eye doctor in the last year. They admit to slight hearing difficulty with regard to whispered voices and some television programs.  They have deferred audiologic testing in the last year.  They do not  have excessive sun exposure. Discussed the need for sun protection: hats, long sleeves and use of sunscreen if there is significant sun exposure.   Diet: the importance of a healthy diet is discussed. They do have a healthy diet.  The benefits of regular aerobic exercise were discussed. he walks 4 times per week ,  20 minutes.   Depression screen: there are no signs or vegative symptoms of depression- irritability, change in appetite, anhedonia, sadness/tearfullness.  Cognitive assessment: the patient manages all their financial and personal affairs and is actively engaged. They could relate day,date,year and events; recalled 2/3 objects at 3 minutes; performed clock-face test normally.  The following portions of the patient's history were reviewed and updated as appropriate: allergies, current medications, past  family history, past medical history,  past surgical history, past social history  and problem list.  Visual acuity was not assessed per patient preference since she has regular follow up with her ophthalmologist. Hearing and body mass index were assessed and reviewed.   During the course of the visit the patient was educated and counseled about appropriate screening and preventive services including : fall prevention , diabetes screening, nutrition counseling, colorectal cancer screening, and recommended immunizations.  CC: The primary encounter diagnosis was Vitamin D deficiency. Diagnoses of Lower extremity numbness, Fatigue, unspecified type, Moderate mixed hyperlipidemia not requiring statin therapy, Need for immunization against influenza, Routine general medical examination at a health care facility, and Personal history of colonic polyps were also pertinent to this visit.  He feels generally well.  Finds himself saddened by his wife's new onset dementia.   History Jon Sherman has a past medical history of Cancer ([place]) (1965), COPD (chronic obstructive pulmonary disease) ([place]), Infectious colitis (2000), Influenza (02/18/2018), Rosacea, and Vertigo.   He has a past surgical history that includes Melanoma excision with sentinel lymph node dissection; Soft Tissue Tumor Resection (1985); and Cataract extraction w/PHACO (Right, 06/29/2019).   His family history includes Heart failure in his father and mother; Mental illness in his mother; Rheumatic fever in his sister.He reports that he quit smoking about 57 years ago. His smoking use included cigarettes. He has a 10.00 pack-year smoking history. He has never used smokeless tobacco. He reports current alcohol use of about 7.0 standard drinks of alcohol per week. He reports that he does not  use drugs.  Outpatient Medications Prior to Visit  Medication Sig Dispense Refill  . ketoconazole (NIZORAL) 2 % cream APPLY AT BEDTIME TO FEET AND BETWEEN TOES    .  metroNIDAZOLE (METROCREAM) 0.75 % cream Apply topically.    . metroNIDAZOLE (METROGEL) 0.75 % gel APPLY TO THE AFFECTED AREA TWICE A DAY    . Multiple Vitamins-Minerals (PRESERVISION AREDS 2) CAPS Take 1 capsule by mouth 2 (two) times daily.     No facility-administered medications prior to visit.     Review of Systems   Patient denies headache, fevers, malaise, unintentional weight loss, skin rash, eye pain, sinus congestion and sinus pain, sore throat, dysphagia,  hemoptysis , cough, dyspnea, wheezing, chest pain, palpitations, orthopnea, edema, abdominal pain, nausea, melena, diarrhea, constipation, flank pain, dysuria, hematuria, urinary  Frequency, nocturia, numbness, tingling, seizures,  Focal weakness, Loss of consciousness,  Tremor, insomnia, depression, anxiety, and suicidal ideation.      Objective:  BP 132/68 (BP Location: Left Arm, Patient Position: Sitting, Cuff Size: Normal)   Pulse 74   Temp (!) 97.2 F (36.2 C) (Temporal)   Resp 15   Ht 5\' 9"  (1.753 m)   Wt 193 lb 3.2 oz (87.6 kg)   SpO2 98%   BMI 28.53 kg/m   Physical Exam   General appearance: alert, cooperative and appears stated age Ears: normal TM's and external ear canals both ears Throat: lips, mucosa, and tongue normal; teeth and gums normal Neck: no adenopathy, no carotid bruit, supple, symmetrical, trachea midline and thyroid not enlarged, symmetric, no tenderness/mass/nodules Back: symmetric, no curvature. ROM normal. No CVA tenderness. Lungs: clear to auscultation bilaterally Heart: regular rate and rhythm, S1, S2 normal, no murmur, click, rub or gallop Abdomen: soft, non-tender; bowel sounds normal; no masses,  no organomegaly Pulses: 2+ and symmetric Skin: Skin color, texture, turgor normal. No rashes or lesions Lymph nodes: Cervical, supraclavicular, and axillary nodes normal.    Assessment & Plan:   Problem List Items Addressed This Visit      Unprioritized   Vitamin D deficiency - Primary    Relevant Orders   VITAMIN D 25 Hydroxy (Vit-D Deficiency, Fractures) (Completed)   Personal history of colonic polyps    He has deferred follow up due to age.       Routine general medical examination at a health care facility    age appropriate education and counseling updated, referrals for preventative services and immunizations addressed, dietary and smoking counseling addressed, most recent labs reviewed.  I have personally reviewed and have noted:  1) the patient's medical and social history 2) The pt's use of alcohol, tobacco, and illicit drugs 3) The patient's current medications and supplements 4) Functional ability including ADL's, fall risk, home safety risk, hearing and visual impairment 5) Diet and physical activities 6) Evidence for depression or mood disorder 7) The patient's height, weight, and BMI have been recorded in the chart  I have made referrals, and provided counseling and education based on review of the above      RESOLVED: Fatigue   Relevant Orders   Comprehensive metabolic panel (Completed)   CBC with Differential/Platelet (Completed)   TSH (Completed)   RESOLVED: Lower extremity numbness    Other Visit Diagnoses    Moderate mixed hyperlipidemia not requiring statin therapy       Relevant Orders   Lipid panel (Completed)   Need for immunization against influenza       Relevant Orders   Flu Vaccine QUAD High  Dose(Fluad) (Completed)      I am having [name] maintain his PreserVision AREDS 2, metroNIDAZOLE, metroNIDAZOLE, and ketoconazole.  No orders of the defined types were placed in this encounter.   There are no discontinued medications.  Follow-up: No follow-ups on file.   Crecencio Mc, MD

## 2019-08-27 LAB — LIPID PANEL
Cholesterol: 148 mg/dL (ref <200)
HDL: 49 mg/dL (ref >=40)
LDL Cholesterol (Calc): 75 mg/dL (calc)
Non-HDL Cholesterol (Calc): 99 mg/dL (calc) (ref <130)
Total CHOL/HDL Ratio: 3 (calc) (ref <5.0)
Triglycerides: 162 mg/dL — ABNORMAL HIGH (ref <150)

## 2019-08-27 LAB — COMPREHENSIVE METABOLIC PANEL
AG Ratio: 2.1 (calc) (ref 1.0–2.5)
ALT: 13 U/L (ref 9–46)
AST: 12 U/L (ref 10–35)
Albumin: 4.1 g/dL (ref 3.6–5.1)
Alkaline phosphatase (APISO): 69 U/L (ref 35–144)
BUN/Creatinine Ratio: 17 (calc) (ref 6–22)
BUN: 21 mg/dL (ref 7–25)
CO2: 27 mmol/L (ref 20–32)
Calcium: 9.1 mg/dL (ref 8.6–10.3)
Chloride: 103 mmol/L (ref 98–110)
Creat: 1.21 mg/dL — ABNORMAL HIGH (ref 0.70–1.11)
Globulin: 2 g/dL (calc) (ref 1.9–3.7)
Glucose, Bld: 102 mg/dL — ABNORMAL HIGH (ref 65–99)
Potassium: 4.4 mmol/L (ref 3.5–5.3)
Sodium: 138 mmol/L (ref 135–146)
Total Bilirubin: 0.5 mg/dL (ref 0.2–1.2)
Total Protein: 6.1 g/dL (ref 6.1–8.1)

## 2019-08-27 LAB — CBC WITH DIFFERENTIAL/PLATELET
Absolute Monocytes: 530 cells/uL (ref 200–950)
Basophils Absolute: 40 cells/uL (ref 0–200)
Basophils Relative: 0.7 %
Eosinophils Absolute: 143 cells/uL (ref 15–500)
Eosinophils Relative: 2.5 %
HCT: 42.5 % (ref 38.5–50.0)
Hemoglobin: 14.4 g/dL (ref 13.2–17.1)
Lymphs Abs: 1180 cells/uL (ref 850–3900)
MCH: 31 pg (ref 27.0–33.0)
MCHC: 33.9 g/dL (ref 32.0–36.0)
MCV: 91.4 fL (ref 80.0–100.0)
MPV: 10 fL (ref 7.5–12.5)
Monocytes Relative: 9.3 %
Neutro Abs: 3808 cells/uL (ref 1500–7800)
Neutrophils Relative %: 66.8 %
Platelets: 239 10*3/uL (ref 140–400)
RBC: 4.65 10*6/uL (ref 4.20–5.80)
RDW: 12.2 % (ref 11.0–15.0)
Total Lymphocyte: 20.7 %
WBC: 5.7 10*3/uL (ref 3.8–10.8)

## 2019-08-27 LAB — VITAMIN D 25 HYDROXY (VIT D DEFICIENCY, FRACTURES): Vit D, 25-Hydroxy: 24 ng/mL — ABNORMAL LOW (ref 30–100)

## 2019-08-27 LAB — TSH: TSH: 2.42 mIU/L (ref 0.40–4.50)

## 2019-08-28 ENCOUNTER — Encounter: Payer: Self-pay | Admitting: Internal Medicine

## 2019-08-28 NOTE — Assessment & Plan Note (Signed)
He has deferred follow up due to age.

## 2019-08-28 NOTE — Assessment & Plan Note (Signed)

## 2019-08-29 NOTE — Discharge Instructions (Signed)

## 2019-08-30 NOTE — Anesthesia Preprocedure Evaluation (Addendum)
Anesthesia Evaluation  Patient identified by MRN, date of birth, ID band Patient awake    Reviewed: Allergy & Precautions, NPO status , Patient's Chart, lab work & pertinent test results  History of Anesthesia Complications Negative for: history of anesthetic complications  Airway Mallampati: II   Neck ROM: Full    Dental  (+)    Pulmonary COPD, former smoker (quit 1963),    Pulmonary exam normal breath sounds clear to auscultation       Cardiovascular Exercise Tolerance: Good negative cardio ROS Normal cardiovascular exam Rhythm:Regular Rate:Normal     Neuro/Psych negative neurological ROS     GI/Hepatic negative GI ROS,   Endo/Other  negative endocrine ROS  Renal/GU negative Renal ROS     Musculoskeletal   Abdominal   Peds  Hematology Melanoma    Anesthesia Other Findings   Reproductive/Obstetrics                            Anesthesia Physical Anesthesia Plan  ASA: II  Anesthesia Plan: MAC   Post-op Pain Management:    Induction: Intravenous  PONV Risk Score and Plan: 1 and TIVA and Midazolam  Airway Management Planned: Natural Airway  Additional Equipment:   Intra-op Plan:   Post-operative Plan:   Informed Consent: I have reviewed the patients History and Physical, chart, labs and discussed the procedure including the risks, benefits and alternatives for the proposed anesthesia with the patient or authorized representative who has indicated his/her understanding and acceptance.       Plan Discussed with: CRNA  Anesthesia Plan Comments:        Anesthesia Quick Evaluation

## 2019-08-31 ENCOUNTER — Ambulatory Visit
Admission: RE | Admit: 2019-08-31 | Discharge: 2019-08-31 | Disposition: A | Discharge status: Home / Self Care | Payer: Medicare Other | Attending: Ophthalmology | Admitting: Ophthalmology

## 2019-08-31 ENCOUNTER — Other Ambulatory Visit: Payer: Self-pay

## 2019-08-31 ENCOUNTER — Ambulatory Visit: Payer: Medicare Other | Admitting: Anesthesiology

## 2019-08-31 ENCOUNTER — Encounter
Admission: RE | Disposition: A | Discharge status: Home / Self Care | Payer: Self-pay | Source: Home / Self Care | Attending: Ophthalmology

## 2019-08-31 DIAGNOSIS — J449 Chronic obstructive pulmonary disease, unspecified: Secondary | ICD-10-CM | POA: Insufficient documentation

## 2019-08-31 DIAGNOSIS — R2243 Localized swelling, mass and lump, lower limb, bilateral: Secondary | ICD-10-CM | POA: Insufficient documentation

## 2019-08-31 DIAGNOSIS — Z9849 Cataract extraction status, unspecified eye: Secondary | ICD-10-CM | POA: Diagnosis not present

## 2019-08-31 DIAGNOSIS — Z79899 Other long term (current) drug therapy: Secondary | ICD-10-CM | POA: Insufficient documentation

## 2019-08-31 DIAGNOSIS — K219 Gastro-esophageal reflux disease without esophagitis: Secondary | ICD-10-CM | POA: Diagnosis not present

## 2019-08-31 DIAGNOSIS — Z885 Allergy status to narcotic agent status: Secondary | ICD-10-CM | POA: Diagnosis not present

## 2019-08-31 DIAGNOSIS — Z85820 Personal history of malignant melanoma of skin: Secondary | ICD-10-CM | POA: Diagnosis not present

## 2019-08-31 DIAGNOSIS — R002 Palpitations: Secondary | ICD-10-CM | POA: Insufficient documentation

## 2019-08-31 DIAGNOSIS — H2512 Age-related nuclear cataract, left eye: Secondary | ICD-10-CM | POA: Diagnosis present

## 2019-08-31 DIAGNOSIS — Z87891 Personal history of nicotine dependence: Secondary | ICD-10-CM | POA: Insufficient documentation

## 2019-08-31 HISTORY — PX: CATARACT EXTRACTION W/PHACO: SHX586

## 2019-08-31 SURGERY — PHACOEMULSIFICATION, CATARACT, WITH IOL INSERTION
Anesthesia: Monitor Anesthesia Care | Site: Eye | Laterality: Left

## 2019-08-31 MED ORDER — CEFUROXIME OPHTHALMIC INJECTION 1 MG/0.1 ML
INJECTION | OPHTHALMIC | Status: DC | PRN
  Administered 2019-08-31: 0.1 mL via INTRACAMERAL

## 2019-08-31 MED ORDER — ARMC OPHTHALMIC DILATING DROPS
1.0000 "application " | OPHTHALMIC | Status: DC | PRN
  Administered 2019-08-31 (×3): 1 via OPHTHALMIC

## 2019-08-31 MED ORDER — LIDOCAINE HCL (PF) 2 % IJ SOLN
INTRAOCULAR | Status: DC | PRN
  Administered 2019-08-31: 1 mL

## 2019-08-31 MED ORDER — TETRACAINE HCL 0.5 % OP SOLN
1.0000 [drp] | OPHTHALMIC | Status: DC | PRN
  Administered 2019-08-31 (×3): 1 [drp] via OPHTHALMIC

## 2019-08-31 MED ORDER — NA HYALUR & NA CHOND-NA HYALUR 0.4-0.35 ML IO KIT
PACK | INTRAOCULAR | Status: DC | PRN
  Administered 2019-08-31: 1 mL via INTRAOCULAR

## 2019-08-31 MED ORDER — MIDAZOLAM HCL 2 MG/2ML IJ SOLN
INTRAMUSCULAR | Status: DC | PRN
  Administered 2019-08-31: 1 mg via INTRAVENOUS

## 2019-08-31 MED ORDER — BRIMONIDINE TARTRATE-TIMOLOL 0.2-0.5 % OP SOLN
OPHTHALMIC | Status: DC | PRN
  Administered 2019-08-31: 1 [drp] via OPHTHALMIC

## 2019-08-31 MED ORDER — FENTANYL CITRATE (PF) 100 MCG/2ML IJ SOLN
INTRAMUSCULAR | Status: DC | PRN
  Administered 2019-08-31: 50 ug via INTRAVENOUS

## 2019-08-31 MED ORDER — EPINEPHRINE PF 1 MG/ML IJ SOLN
INTRAOCULAR | Status: DC | PRN
  Administered 2019-08-31: 88 mL via OPHTHALMIC

## 2019-08-31 MED ORDER — MOXIFLOXACIN HCL 0.5 % OP SOLN
1.0000 [drp] | OPHTHALMIC | Status: DC | PRN
  Administered 2019-08-31 (×3): 1 [drp] via OPHTHALMIC

## 2019-08-31 SURGICAL SUPPLY — 16 items
CANNULA ANT/CHMB 27G (MISCELLANEOUS) ×1 IMPLANT
CANNULA ANT/CHMB 27GA (MISCELLANEOUS) ×3 IMPLANT
GLOVE SURG LX 7.5 STRW (GLOVE) ×2
GLOVE SURG LX STRL 7.5 STRW (GLOVE) ×1 IMPLANT
GLOVE SURG TRIUMPH 8.0 PF LTX (GLOVE) ×3 IMPLANT
GOWN STRL REUS W/ TWL LRG LVL3 (GOWN DISPOSABLE) ×2 IMPLANT
GOWN STRL REUS W/TWL LRG LVL3 (GOWN DISPOSABLE) ×4
LENS IOL TECNIS ITEC 21.0 (Intraocular Lens) ×2 IMPLANT
MARKER SKIN DUAL TIP RULER LAB (MISCELLANEOUS) ×3 IMPLANT
PACK CATARACT BRASINGTON (MISCELLANEOUS) ×3 IMPLANT
PACK EYE AFTER SURG (MISCELLANEOUS) ×3 IMPLANT
PACK OPTHALMIC (MISCELLANEOUS) ×3 IMPLANT
SYR 3ML LL SCALE MARK (SYRINGE) ×3 IMPLANT
SYR TB 1ML LUER SLIP (SYRINGE) ×3 IMPLANT
WATER STERILE IRR 500ML POUR (IV SOLUTION) ×3 IMPLANT
WIPE NON LINTING 3.25X3.25 (MISCELLANEOUS) ×3 IMPLANT

## 2019-08-31 NOTE — Anesthesia Postprocedure Evaluation (Signed)
Anesthesia Post Note  Patient: Jon Sherman.  Procedure(s) Performed: CATARACT EXTRACTION PHACO AND INTRAOCULAR LENS PLACEMENT (IOC) LEFT 02:06.7  25.2%  32.01 (Left Eye)  Patient location during evaluation: PACU Anesthesia Type: MAC Level of consciousness: awake and alert, oriented and patient cooperative Pain management: pain level controlled Vital Signs Assessment: post-procedure vital signs reviewed and stable Respiratory status: spontaneous breathing, nonlabored ventilation and respiratory function stable Cardiovascular status: blood pressure returned to baseline and stable Postop Assessment: adequate PO intake Anesthetic complications: no    Darrin Nipper

## 2019-08-31 NOTE — H&P (Signed)

## 2019-08-31 NOTE — Transfer of Care (Signed)
Immediate Anesthesia Transfer of Care Note  Patient: Jon Sherman.  Procedure(s) Performed: CATARACT EXTRACTION PHACO AND INTRAOCULAR LENS PLACEMENT (IOC) LEFT 02:06.7  25.2%  32.01 (Left Eye)  Patient Location: PACU  Anesthesia Type: MAC  Level of Consciousness: awake, alert  and patient cooperative  Airway and Oxygen Therapy: Patient Spontanous Breathing and Patient connected to supplemental oxygen  Post-op Assessment: Post-op Vital signs reviewed, Patient's Cardiovascular Status Stable, Respiratory Function Stable, Patent Airway and No signs of Nausea or vomiting  Post-op Vital Signs: Reviewed and stable  Complications: No apparent anesthesia complications

## 2019-08-31 NOTE — Op Note (Signed)
OPERATIVE NOTE  Jon Sherman [id number] 08/31/2019   PREOPERATIVE DIAGNOSIS:  Nuclear sclerotic cataract left eye. H25.12   POSTOPERATIVE DIAGNOSIS:    Nuclear sclerotic cataract left eye.     PROCEDURE:  Phacoemusification with posterior chamber intraocular lens placement of the left eye  Ultrasound time: Procedure(s): CATARACT EXTRACTION PHACO AND INTRAOCULAR LENS PLACEMENT (IOC) LEFT 02:06.7  25.2%  32.01 (Left)  LENS:   Implant Name Type Inv. Item Serial No. Manufacturer Lot No. LRB No. Used Action  LENS IOL DIOP 21.0 - [id number] Intraocular Lens LENS IOL DIOP 21.0 [id number]  Left 1 Implanted      SURGEON:  Wyonia Hough, MD   ANESTHESIA:  Topical with tetracaine drops and 2% Xylocaine jelly, augmented with 1% preservative-free intracameral lidocaine.    COMPLICATIONS:  None.   DESCRIPTION OF PROCEDURE:  The patient was identified in the holding room and transported to the operating room and placed in the supine position under the operating microscope.  The left eye was identified as the operative eye and it was prepped and draped in the usual sterile ophthalmic fashion.   A 1 millimeter clear-corneal paracentesis was made at the 1:30 position.  0.5 ml of preservative-free 1% lidocaine was injected into the anterior chamber.  The anterior chamber was filled with Viscoat viscoelastic.  A 2.4 millimeter keratome was used to make a near-clear corneal incision at the 10:30 position.  .  A curvilinear capsulorrhexis was made with a cystotome and capsulorrhexis forceps.  Balanced salt solution was used to hydrodissect and hydrodelineate the nucleus.   Phacoemulsification was then used in stop and chop fashion to remove the lens nucleus and epinucleus.  The remaining cortex was then removed using the irrigation and aspiration handpiece. Provisc was then placed into the capsular bag to distend it for lens placement.  A lens was then injected into the capsular bag.   The remaining viscoelastic was aspirated.   Wounds were hydrated with balanced salt solution.  The anterior chamber was inflated to a physiologic pressure with balanced salt solution.  No wound leaks were noted. Cefuroxime 0.1 ml of a 10mg /ml solution was injected into the anterior chamber for a dose of 1 mg of intracameral antibiotic at the completion of the case.   Timolol and Brimonidine drops were applied to the eye.  The patient was taken to the recovery room in stable condition without complications of anesthesia or surgery.  [name] 08/31/2019, 11:43 AM

## 2019-08-31 NOTE — Anesthesia Procedure Notes (Signed)
Procedure Name: MAC Date/Time: 08/31/2019 11:19 AM Performed by: Cameron Ali, CRNA Pre-anesthesia Checklist: Patient identified, Emergency Drugs available, Suction available, Timeout performed and Patient being monitored Patient Re-evaluated:Patient Re-evaluated prior to induction Oxygen Delivery Method: Nasal cannula Placement Confirmation: positive ETCO2

## 2019-09-01 ENCOUNTER — Encounter: Payer: Self-pay | Admitting: Ophthalmology

## 2019-12-27 DIAGNOSIS — H353211 Exudative age-related macular degeneration, right eye, with active choroidal neovascularization: Secondary | ICD-10-CM | POA: Diagnosis not present

## 2019-12-27 DIAGNOSIS — H353122 Nonexudative age-related macular degeneration, left eye, intermediate dry stage: Secondary | ICD-10-CM | POA: Diagnosis not present

## 2020-02-14 ENCOUNTER — Ambulatory Visit: Payer: Medicare PPO | Admitting: Dermatology

## 2020-02-14 ENCOUNTER — Other Ambulatory Visit: Payer: Self-pay

## 2020-02-14 DIAGNOSIS — L300 Nummular dermatitis: Secondary | ICD-10-CM | POA: Diagnosis not present

## 2020-02-14 DIAGNOSIS — Z85828 Personal history of other malignant neoplasm of skin: Secondary | ICD-10-CM

## 2020-02-14 DIAGNOSIS — Z85820 Personal history of malignant melanoma of skin: Secondary | ICD-10-CM | POA: Diagnosis not present

## 2020-02-14 DIAGNOSIS — B353 Tinea pedis: Secondary | ICD-10-CM

## 2020-02-14 DIAGNOSIS — L821 Other seborrheic keratosis: Secondary | ICD-10-CM

## 2020-02-14 DIAGNOSIS — D229 Melanocytic nevi, unspecified: Secondary | ICD-10-CM

## 2020-02-14 DIAGNOSIS — L820 Inflamed seborrheic keratosis: Secondary | ICD-10-CM

## 2020-02-14 DIAGNOSIS — D1801 Hemangioma of skin and subcutaneous tissue: Secondary | ICD-10-CM

## 2020-02-14 DIAGNOSIS — Z1283 Encounter for screening for malignant neoplasm of skin: Secondary | ICD-10-CM | POA: Diagnosis not present

## 2020-02-14 DIAGNOSIS — L578 Other skin changes due to chronic exposure to nonionizing radiation: Secondary | ICD-10-CM

## 2020-02-14 MED ORDER — TRIAMCINOLONE ACETONIDE 0.1 % EX CREA: 1.0000 "application " | TOPICAL_CREAM | Freq: Two times a day (BID) | CUTANEOUS | 2 refills | Status: DC | PRN

## 2020-02-14 NOTE — Patient Instructions (Addendum)
Recommend daily broad spectrum sunscreen SPF 30+ to sun-exposed areas, reapply every 2 hours as needed. Call for new or changing lesions.  Ketoconazole 2% cream to feet, in between toes and area in groin once daily.  Triamcinolone 1% cream to area on left thigh for nummular dermatitis. Avoid face, groin, axilla.

## 2020-02-14 NOTE — Progress Notes (Signed)
Follow-Up Visit   Subjective  Jon Sherman. is a 86 y.o. male who presents for the following: Annual Exam (HxMMis, HxBCC).  Uses ketoconazole cream, but not on a regular basis, for tinea pedis.  Has itchy patch on back of leg.  Has irritated growth on right side.   The following portions of the chart were reviewed this encounter and updated as appropriate:     Review of Systems: No other skin or systemic complaints.  Objective  Well appearing patient in no apparent distress; mood and affect are within normal limits.  A full examination was performed including scalp, head, eyes, ears, nose, lips, neck, chest, axillae, abdomen, back, buttocks, bilateral upper extremities, bilateral lower extremities, hands, feet, fingers, toes, fingernails, and toenails. All findings within normal limits unless otherwise noted below.  Objective  R forehead: Well healed scar with no evidence of recurrence.   Objective  Right Knee: Well healed scar with no evidence of recurrence  Objective  R upper flank: Erythematous keratotic stuck-on papule. Irritated.  Objective  Left post thigh: Pink scaly patch with mild lichenification   Objective  Toes, L inguinal crease: Scaling of toes with associated toenail dystrophy and scaling over distal and lateral soles.  Annular pink scaly patch L inguinal crease.  Assessment & Plan  History of basal cell carcinoma (BCC) R forehead  No evidence of recurrence, call clinic for new or changing lesions.   History of melanoma Right Knee  Clear. Observe for recurrence. Call clinic for new or changing lesions.  Recommend regular skin exams, daily broad-spectrum spf 30+ sunscreen use, and photoprotection.     Inflamed seborrheic keratosis R upper flank  Destruction of lesion - R upper flank  Destruction method: cryotherapy   Informed consent: discussed and consent obtained   Lesion destroyed using liquid nitrogen: Yes   Region frozen until ice  ball extended beyond lesion: Yes   Outcome: patient tolerated procedure well with no complications   Post-procedure details: wound care instructions given    Nummular dermatitis Left post thigh  Start TMC 0.1% cream BID to AA's PRN rash. Avoid F/G/A #80 2RF Pharmacy called and Rx revised to 45 gm tube, no rfs If not improved after 1 month, then switch to Ketoconazole cream.  Topical steroids (such as triamcinolone, fluocinolone, fluocinonide, mometasone, clobetasol, halobetasol, betamethasone, hydrocortisone) can cause thinning and lightening of the skin if they are used for too long in the same area. Your physician has selected the right strength medicine for your problem and area affected on the body. Please use your medication only as directed by your physician to prevent side effects.    triamcinolone cream (KENALOG) 0.1 % - Left post thigh  Tinea pedis of both feet Toes, L inguinal crease  Cont ketoconazole 2% cream to feet and in between toes and AA's qhs.Continue Eucerin Roughness Relief Spot treatment cream qd  Skin cancer screening performed today. Seborrheic Keratoses - Stuck-on, waxy, tan-brown papules and plaques  - Discussed benign etiology and prognosis. - Observe - Call for any changes Hemangiomas - Red papules - Discussed benign nature - Observe - Call for any changes Melanocytic Nevi - Tan-brown and/or pink-flesh-colored symmetric macules and papules - Benign appearing on exam today - Observation - Call clinic for new or changing moles - Recommend daily use of broad spectrum spf 30+ sunscreen to sun-exposed areas.  Actinic Damage - diffuse scaly erythematous macules with underlying dyspigmentation - Recommend daily broad spectrum sunscreen SPF 30+ to sun-exposed areas, reapply  every 2 hours as needed.  - Call for new or changing lesions.   Return in about 6 months (around 08/16/2020) for TBSE, h/o melanoma, BCC.   Graciella Belton, RMA, am acting as scribe  for Brendolyn Patty, MD .

## 2020-03-27 DIAGNOSIS — H353211 Exudative age-related macular degeneration, right eye, with active choroidal neovascularization: Secondary | ICD-10-CM | POA: Diagnosis not present

## 2020-04-02 DIAGNOSIS — H353211 Exudative age-related macular degeneration, right eye, with active choroidal neovascularization: Secondary | ICD-10-CM | POA: Diagnosis not present

## 2020-05-15 ENCOUNTER — Other Ambulatory Visit: Payer: Self-pay | Admitting: Dermatology

## 2020-06-29 ENCOUNTER — Ambulatory Visit: Payer: Medicare Other

## 2020-07-02 ENCOUNTER — Ambulatory Visit (INDEPENDENT_AMBULATORY_CARE_PROVIDER_SITE_OTHER): Payer: Medicare PPO

## 2020-07-02 VITALS — Ht 69.0 in | Wt 193.0 lb

## 2020-07-02 DIAGNOSIS — Z0000 Encounter for general adult medical examination without abnormal findings: Secondary | ICD-10-CM

## 2020-07-02 NOTE — Patient Instructions (Addendum)
[name] , Thank you for taking time to come for your Medicare Wellness Visit. I appreciate your ongoing commitment to your health goals. Please review the following plan we discussed and let me know if I can assist you in the future.   These are the goals we discussed: Goals      Patient Stated   .  Maintain Healthy Lifestyle (pt-stated)      Stay active Stay hydrated       This is a list of the screening recommended for you and due dates:  Health Maintenance  Topic Date Due  . Flu Shot  10/02/2020*  . Tetanus Vaccine  02/23/2023  . COVID-19 Vaccine  Completed  . Pneumonia vaccines  Completed  *Topic was postponed. The date shown is not the original due date.    Immunizations Immunization History  Administered Date(s) Administered  . Fluad Quad(high Dose 65+) 08/26/2019  . Influenza Split 09/12/2013  . Influenza-Unspecified 09/17/2012, 09/15/2014  . Pneumococcal Conjugate-13 11/20/2014  . Pneumococcal Polysaccharide-23 11/20/2005  . Tdap 02/22/2013  . Zoster Recombinat (Shingrix) 02/11/2018   Advanced directives: End of life planning; Advance aging; Advanced directives discussed.  Copy of current HCPOA/Living Will requested.    Conditions/risks identified: none new  Follow up in one year for your annual wellness visit.   Keep all routine maintenance appointments.   Follow up 08/27/20 @ 8:00; fasting  Preventive Care 65 Years and Older, Male Preventive care refers to lifestyle choices and visits with your health care provider that can promote health and wellness. What does preventive care include?  A yearly physical exam. This is also called an annual well check.  Dental exams once or twice a year.  Routine eye exams. Ask your health care provider how often you should have your eyes checked.  Personal lifestyle choices, including:  Daily care of your teeth and gums.  Regular physical activity.  Eating a healthy diet.  Avoiding tobacco and drug  use.  Limiting alcohol use.  Practicing safe sex.  Taking low doses of aspirin every day.  Taking vitamin and mineral supplements as recommended by your health care provider. What happens during an annual well check? The services and screenings done by your health care provider during your annual well check will depend on your age, overall health, lifestyle risk factors, and family history of disease. Counseling  Your health care provider may ask you questions about your:  Alcohol use.  Tobacco use.  Drug use.  Emotional well-being.  Home and relationship well-being.  Sexual activity.  Eating habits.  History of falls.  Memory and ability to understand (cognition).  Work and work Statistician. Screening  You may have the following tests or measurements:  Height, weight, and BMI.  Blood pressure.  Lipid and cholesterol levels. These may be checked every 5 years, or more frequently if you are over 24 years old.  Skin check.  Lung cancer screening. You may have this screening every year starting at age 17 if you have a 30-pack-year history of smoking and currently smoke or have quit within the past 15 years.  Fecal occult blood test (FOBT) of the stool. You may have this test every year starting at age 54.  Flexible sigmoidoscopy or colonoscopy. You may have a sigmoidoscopy every 5 years or a colonoscopy every 10 years starting at age 40.  Prostate cancer screening. Recommendations will vary depending on your family history and other risks.  Hepatitis C blood test.  Hepatitis B blood test.  Sexually transmitted disease (STD) testing.  Diabetes screening. This is done by checking your blood sugar (glucose) after you have not eaten for a while (fasting). You may have this done every 1-3 years.  Abdominal aortic aneurysm (AAA) screening. You may need this if you are a current or former smoker.  Osteoporosis. You may be screened starting at age 40 if you are at  high risk. Talk with your health care provider about your test results, treatment options, and if necessary, the need for more tests. Vaccines  Your health care provider may recommend certain vaccines, such as:  Influenza vaccine. This is recommended every year.  Tetanus, diphtheria, and acellular pertussis (Tdap, Td) vaccine. You may need a Td booster every 10 years.  Zoster vaccine. You may need this after age 90.  Pneumococcal 13-valent conjugate (PCV13) vaccine. One dose is recommended after age 107.  Pneumococcal polysaccharide (PPSV23) vaccine. One dose is recommended after age 73. Talk to your health care provider about which screenings and vaccines you need and how often you need them. This information is not intended to replace advice given to you by your health care provider. Make sure you discuss any questions you have with your health care provider. Document Released: 11/30/2015 Document Revised: 07/23/2016 Document Reviewed: 09/04/2015 Elsevier Interactive Patient Education  2017 [place] Prevention in the Home Falls can cause injuries. They can happen to people of all ages. There are many things you can do to make your home safe and to help prevent falls. What can I do on the outside of my home?  Regularly fix the edges of walkways and driveways and fix any cracks.  Remove anything that might make you trip as you walk through a door, such as a raised step or threshold.  Trim any bushes or trees on the path to your home.  Use bright outdoor lighting.  Clear any walking paths of anything that might make someone trip, such as rocks or tools.  Regularly check to see if handrails are loose or broken. Make sure that both sides of any steps have handrails.  Any raised decks and porches should have guardrails on the edges.  Have any leaves, snow, or ice cleared regularly.  Use sand or salt on walking paths during winter.  Clean up any spills in your garage  right away. This includes oil or grease spills. What can I do in the bathroom?  Use night lights.  Install grab bars by the toilet and in the tub and shower. Do not use towel bars as grab bars.  Use non-skid mats or decals in the tub or shower.  If you need to sit down in the shower, use a plastic, non-slip stool.  Keep the floor dry. Clean up any water that spills on the floor as soon as it happens.  Remove soap buildup in the tub or shower regularly.  Attach bath mats securely with double-sided non-slip rug tape.  Do not have throw rugs and other things on the floor that can make you trip. What can I do in the bedroom?  Use night lights.  Make sure that you have a light by your bed that is easy to reach.  Do not use any sheets or blankets that are too big for your bed. They should not hang down onto the floor.  Have a firm chair that has side arms. You can use this for support while you get dressed.  Do not have throw rugs and other  things on the floor that can make you trip. What can I do in the kitchen?  Clean up any spills right away.  Avoid walking on wet floors.  Keep items that you use a lot in easy-to-reach places.  If you need to reach something above you, use a strong step stool that has a grab bar.  Keep electrical cords out of the way.  Do not use floor polish or wax that makes floors slippery. If you must use wax, use non-skid floor wax.  Do not have throw rugs and other things on the floor that can make you trip. What can I do with my stairs?  Do not leave any items on the stairs.  Make sure that there are handrails on both sides of the stairs and use them. Fix handrails that are broken or loose. Make sure that handrails are as long as the stairways.  Check any carpeting to make sure that it is firmly attached to the stairs. Fix any carpet that is loose or worn.  Avoid having throw rugs at the top or bottom of the stairs. If you do have throw rugs,  attach them to the floor with carpet tape.  Make sure that you have a light switch at the top of the stairs and the bottom of the stairs. If you do not have them, ask someone to add them for you. What else can I do to help prevent falls?  Wear shoes that:  Do not have high heels.  Have rubber bottoms.  Are comfortable and fit you well.  Are closed at the toe. Do not wear sandals.  If you use a stepladder:  Make sure that it is fully opened. Do not climb a closed stepladder.  Make sure that both sides of the stepladder are locked into place.  Ask someone to hold it for you, if possible.  Clearly mark and make sure that you can see:  Any grab bars or handrails.  First and last steps.  Where the edge of each step is.  Use tools that help you move around (mobility aids) if they are needed. These include:  Canes.  Walkers.  Scooters.  Crutches.  Turn on the lights when you go into a dark area. Replace any light bulbs as soon as they burn out.  Set up your furniture so you have a clear path. Avoid moving your furniture around.  If any of your floors are uneven, fix them.  If there are any pets around you, be aware of where they are.  Review your medicines with your doctor. Some medicines can make you feel dizzy. This can increase your chance of falling. Ask your doctor what other things that you can do to help prevent falls. This information is not intended to replace advice given to you by your health care provider. Make sure you discuss any questions you have with your health care provider. Document Released: 08/30/2009 Document Revised: 04/10/2016 Document Reviewed: 12/08/2014 Elsevier Interactive Patient Education  2017 [employer].

## 2020-07-02 NOTE — Progress Notes (Addendum)
Subjective:   Daily Crate. is a 86 y.o. male who presents for Medicare Annual/Subsequent preventive examination.  Review of Systems    No ROS.  Medicare Wellness Virtual Visit.    Cardiac Risk Factors include: advanced age (>80men, >46 women);diabetes mellitus     Objective:    Today's Vitals   07/02/20 1237  Weight: 193 lb (87.5 kg)  Height: 5\' 9"  (1.753 m)   Body mass index is 28.5 kg/m.  Advanced Directives 07/02/2020 06/29/2019 06/29/2019 03/19/2018 03/18/2017 03/05/2016  Does Patient Have a Medical Advance Directive? Yes Yes Yes Yes Yes Yes  Type of Paramedic of [place];Living will [place];Living will Healthcare Power of [place];Living will [place];Living will [place];Living will  Does patient want to make changes to medical advance directive? No - Patient declined No - Patient declined No - Patient declined No - Patient declined No - Patient declined -  Copy of [place] in Chart? No - copy requested No - copy requested No - copy requested No - copy requested No - copy requested No - copy requested    Current Medications (verified) Outpatient Encounter Medications as of 07/02/2020  Medication Sig   ketoconazole (NIZORAL) 2 % cream APPLY AT BEDTIME TO FEET AND BETWEEN TOES   metroNIDAZOLE (METROCREAM) 0.75 % cream Apply topically.   metroNIDAZOLE (METROGEL) 0.75 % gel APPLY A SMALL AMOUNT TO AFFECTED AREA EVERY MORNING   Multiple Vitamins-Minerals (PRESERVISION AREDS 2) CAPS Take 1 capsule by mouth 2 (two) times daily.   triamcinolone cream (KENALOG) 0.1 % Apply 1 application topically 2 (two) times daily as needed. Avoid face, groin, axilla   No facility-administered encounter medications on file as of 07/02/2020.    Allergies (verified) Demerol and Meperidine   History: Past Medical History:  Diagnosis Date   Basal cell  carcinoma    right forehead   Cancer ([place]) 1965   melanoma, LNs resected   COPD (chronic obstructive pulmonary disease) ([place])    Infectious colitis 2000   requiring hospitalization   Influenza 02/18/2018   Melanoma in situ of right lower extremity ([place]) 07/05/2018   right medial knee   Rosacea    Vertigo    1 episode (2019)   Past Surgical History:  Procedure Laterality Date   CATARACT EXTRACTION W/PHACO Right 06/29/2019   Procedure: CATARACT EXTRACTION PHACO AND INTRAOCULAR LENS PLACEMENT ([place]) RIGHT;  Surgeon: Leandrew Koyanagi, MD;  Location: [place];  Service: Ophthalmology;  Laterality: Right;   CATARACT EXTRACTION W/PHACO Left 08/31/2019   Procedure: CATARACT EXTRACTION PHACO AND INTRAOCULAR LENS PLACEMENT ([facility]) LEFT 02:06.7  25.2%  32.01;  Surgeon: Leandrew Koyanagi, MD;  Location: [place];  Service: Ophthalmology;  Laterality: Left;   MELANOMA EXCISION WITH SENTINEL LYMPH NODE BIOPSY     SOFT TISSUE TUMOR RESECTION  1985   Family History  Problem Relation Age of Onset   Mental illness Mother    Heart failure Mother    Heart failure Father    Rheumatic fever Sister    Cancer Neg Hx    Drug abuse Neg Hx    Social History   Socioeconomic History   Marital status: Married    Spouse name: Not on file   Number of children: Not on file   Years of education: Not on file   Highest education level: Not on file  Occupational History   Not on file  Tobacco  Use   Smoking status: Former Smoker    Packs/day: 1.00    Years: 10.00    Pack years: 10.00    Types: Cigarettes    Quit date: 02/22/1962    Years since quitting: 58.3   Smokeless tobacco: Never Used  Vaping Use   Vaping Use: Never used  Substance and Sexual Activity   Alcohol use: Yes    Alcohol/week: 7.0 standard drinks    Types: 7 Standard drinks or equivalent per week    Comment: Occasional wine and beer with meal    Drug use: No   Sexual activity: Never  Other Topics Concern    Not on file  Social History Narrative   Married    Lives in [place] >10 years as of 02/2018    Social Determinants of Health   Financial Resource Strain:    Difficulty of Paying Living Expenses:   Food Insecurity: No Food Insecurity   Worried About Charity fundraiser in the Last Year: Never true   Ran Out of Food in the Last Year: Never true  Transportation Needs: No Transportation Needs   Lack of Transportation (Medical): No   Lack of Transportation (Non-Medical): No  Physical Activity:    Days of Exercise per Week:    Minutes of Exercise per Session:   Stress: No Stress Concern Present   Feeling of Stress : Not at all  Social Connections: Unknown   Frequency of Communication with Friends and Family: Twice a week   Frequency of Social Gatherings with Friends and Family: Twice a week   Attends Religious Services: Not on Electrical engineer or Organizations: Not on file   Attends Archivist Meetings: Not on file   Marital Status: Married    Tobacco Counseling Counseling given: Not Answered   Clinical Intake:  Pre-visit preparation completed: Yes        Diabetes: No  How often do you need to have someone help you when you read instructions, pamphlets, or other written materials from your doctor or pharmacy?: 1 - Never  Interpreter Needed?: No      Activities of Daily Living In your present state of health, do you have any difficulty performing the following activities: 07/02/2020 08/31/2019  Hearing? N N  Vision? N N  Difficulty concentrating or making decisions? N N  Walking or climbing stairs? N N  Dressing or bathing? N N  Doing errands, shopping? N -  Preparing Food and eating ? N -  Using the Toilet? N -  In the past six months, have you accidently leaked urine? N -  Do you have problems with loss of bowel control? N -  Managing your Medications? N -  Managing your Finances? N -  Housekeeping or managing your Housekeeping? N -    Some recent data might be hidden    Patient Care Team: Crecencio Mc, MD as PCP - General (Internal Medicine) Crecencio Mc, MD (Internal Medicine)  Indicate any recent Medical Services you may have received from other than [facility] providers in the past year (date may be approximate).     Assessment:   This is a routine wellness examination for Jon Sherman.  I connected with Jeanluc today by telephone and verified that I am speaking with the correct person using two identifiers. Location patient: home Location provider: work Persons participating in the virtual visit: patient, Marine scientist.    I discussed the limitations, risks, security and privacy concerns  of performing an evaluation and management service by telephone and the availability of in person appointments. The patient expressed understanding and verbally consented to this telephonic visit.    Interactive audio and video telecommunications were attempted between this provider and patient, however failed, due to patient having technical difficulties OR patient did not have access to video capability.  We continued and completed visit with audio only.  Some vital signs may be absent or patient reported.   Hearing/Vision screen  Hearing Screening   125Hz  250Hz  500Hz  1000Hz  2000Hz  3000Hz  4000Hz  6000Hz  8000Hz   Right ear:           Left ear:           Comments: Patient is able to hear conversational tones without difficulty.  No issues reported.  Vision Screening Comments: Followed by [facility] Wears corrective lenses Macular degeneration;injections every 8 weeks Visual acuity not assessed, virtual visit.  They have seen their ophthalmologist      Dietary issues and exercise activities discussed: Current Exercise Habits: Home exercise routine, Type of exercise: walkingHealthy diet Fair water intake Caffeine- 1 cup of coffee  Goals       Patient Stated     Maintain Healthy Lifestyle (pt-stated)      Stay  active Stay hydrated       Depression Screen PHQ 2/9 Scores 07/02/2020 06/29/2019 09/15/2018 03/19/2018 03/18/2017 03/05/2016 02/27/2015  PHQ - 2 Score 0 0 0 0 0 0 0  PHQ- 9 Score - - - - 0 - -    Fall Risk Fall Risk  07/02/2020 08/26/2019 06/29/2019 04/06/2019 09/15/2018  Falls in the past year? 0 0 0 1 No  Number falls in past yr: 0 - - 0 -  Injury with Fall? - - - 0 -  Follow up Falls evaluation completed Falls evaluation completed - - -   Handrails in use when climbing stairs? Yes  Home free of loose throw rugs in walkways, pet beds, electrical cords, etc? Yes  Adequate lighting in your home to reduce risk of falls? Yes   ASSISTIVE DEVICES UTILIZED TO PREVENT FALLS:  Life alert? No  Use of a cane, walker or w/c? No  Grab bars in the bathroom? Yes  Shower chair or bench in shower? Yes  Elevated toilet seat or a handicapped toilet? Yes   TIMED UP AND GO:  Was the test performed? No . Virtual visit.    Cognitive Function: MMSE - Mini Mental State Exam 03/05/2016  Orientation to time 5  Orientation to Place 5  Registration 3  Attention/ Calculation 5  Recall 3  Language- name 2 objects 2  Language- repeat 1  Language- follow 3 step command 3  Language- read & follow direction 1  Write a sentence 1  Copy design 1  Total score 30     6CIT Screen 07/02/2020 06/29/2019 03/19/2018 03/18/2017  What Year? 0 points 0 points 0 points 0 points  What month? 0 points 0 points 0 points 0 points  What time? 0 points 0 points 0 points 0 points  Count back from 20 0 points 0 points 0 points 0 points  Months in reverse 0 points 0 points 0 points 0 points  Repeat phrase 0 points 0 points 0 points 0 points  Total Score 0 0 0 0    Immunizations Immunization History  Administered Date(s) Administered   Fluad Quad(high Dose 65+) 08/26/2019   Influenza Split 09/12/2013   Influenza-Unspecified 09/17/2012, 09/15/2014   Moderna  SARS-COVID-2 Vaccination 12/02/2019, 12/30/2019   Pneumococcal  Conjugate-13 11/20/2014   Pneumococcal Polysaccharide-23 11/20/2005   Tdap 02/22/2013   Zoster Recombinat (Shingrix) 02/11/2018    Health Maintenance Health Maintenance  Topic Date Due   INFLUENZA VACCINE  10/02/2020 (Originally 06/17/2020)   TETANUS/TDAP  02/23/2023   COVID-19 Vaccine  Completed   PNA vac Low Risk Adult  Completed   Influenza vaccine- out of stock  Dental Screening: Recommended annual dental exams for proper oral hygiene.Visist every 6 months.   Community Resource Referral / Chronic Care Management: CRR required this visit?  No   CCM required this visit?  No      Plan:   Keep all routine maintenance appointments.   Follow up 08/27/20 @ 8:00; fasting  I have personally reviewed and noted the following in the patient's chart:   Medical and social history Use of alcohol, tobacco or illicit drugs  Current medications and supplements Functional ability and status Nutritional status Physical activity Advanced directives List of other physicians Hospitalizations, surgeries, and ER visits in previous 12 months Vitals Screenings to include cognitive, depression, and falls Referrals and appointments  In addition, I have reviewed and discussed with patient certain preventive protocols, quality metrics, and best practice recommendations. A written personalized care plan for preventive services as well as general preventive health recommendations were provided to patient via mail.     OBrien-Blaney, Jakiah Goree L, LPN   6/38/7564    I have reviewed the above information and agree with above.   Deborra Medina, MD

## 2020-07-03 DIAGNOSIS — H353122 Nonexudative age-related macular degeneration, left eye, intermediate dry stage: Secondary | ICD-10-CM | POA: Diagnosis not present

## 2020-07-03 DIAGNOSIS — H353211 Exudative age-related macular degeneration, right eye, with active choroidal neovascularization: Secondary | ICD-10-CM | POA: Diagnosis not present

## 2020-08-20 ENCOUNTER — Other Ambulatory Visit: Payer: Self-pay

## 2020-08-20 ENCOUNTER — Ambulatory Visit (INDEPENDENT_AMBULATORY_CARE_PROVIDER_SITE_OTHER): Payer: Medicare PPO | Admitting: Dermatology

## 2020-08-20 DIAGNOSIS — D2261 Melanocytic nevi of right upper limb, including shoulder: Secondary | ICD-10-CM | POA: Diagnosis not present

## 2020-08-20 DIAGNOSIS — Z85828 Personal history of other malignant neoplasm of skin: Secondary | ICD-10-CM | POA: Diagnosis not present

## 2020-08-20 DIAGNOSIS — L719 Rosacea, unspecified: Secondary | ICD-10-CM

## 2020-08-20 DIAGNOSIS — C4339 Malignant melanoma of other parts of face: Secondary | ICD-10-CM | POA: Diagnosis not present

## 2020-08-20 DIAGNOSIS — L578 Other skin changes due to chronic exposure to nonionizing radiation: Secondary | ICD-10-CM | POA: Diagnosis not present

## 2020-08-20 DIAGNOSIS — L820 Inflamed seborrheic keratosis: Secondary | ICD-10-CM

## 2020-08-20 DIAGNOSIS — D485 Neoplasm of uncertain behavior of skin: Secondary | ICD-10-CM | POA: Diagnosis not present

## 2020-08-20 DIAGNOSIS — D229 Melanocytic nevi, unspecified: Secondary | ICD-10-CM

## 2020-08-20 DIAGNOSIS — B353 Tinea pedis: Secondary | ICD-10-CM | POA: Diagnosis not present

## 2020-08-20 DIAGNOSIS — C4330 Malignant melanoma of unspecified part of face: Secondary | ICD-10-CM

## 2020-08-20 DIAGNOSIS — L821 Other seborrheic keratosis: Secondary | ICD-10-CM

## 2020-08-20 DIAGNOSIS — Z1283 Encounter for screening for malignant neoplasm of skin: Secondary | ICD-10-CM

## 2020-08-20 DIAGNOSIS — Z86006 Personal history of melanoma in-situ: Secondary | ICD-10-CM

## 2020-08-20 DIAGNOSIS — R21 Rash and other nonspecific skin eruption: Secondary | ICD-10-CM | POA: Diagnosis not present

## 2020-08-20 DIAGNOSIS — D1800 Hemangioma unspecified site: Secondary | ICD-10-CM

## 2020-08-20 HISTORY — DX: Malignant melanoma of unspecified part of face: C43.30

## 2020-08-20 NOTE — Progress Notes (Signed)
Follow-Up Visit   Subjective  Jon Chait. is a 86 y.o. male who presents for the following: 6 month follow-up (Hx melanoma in situ, Hx BCC.).  Patient has a couple of bothersome spots on his left leg and abdomen. 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 The topical steroid cream didn't help.  He does tend to rub area.  He has a h/o tinea pedis which he has treated with ketoconazole cream.  He also has rosacea and uses metrogel.   The following portions of the chart were reviewed this encounter and updated as appropriate:      Review of Systems:  No other skin or systemic complaints except as noted in HPI or Assessment and Plan.  Objective  Well appearing patient in no apparent distress; mood and affect are within normal limits.  All skin waist up examined.  Objective  Right Medial Knee: Well healed scar with no evidence of recurrence.   Objective  Right Forehead: Well healed scar with no evidence of recurrence.   Objective  Left Posterior Thigh: Pink scaly lichenified patch. Not itchy  Objective  Left Anterior Thigh x 1, Mid abd x 1: Erythematous keratotic or waxy stuck-on papule   Objective  Right Lower Cheek: 1.0 cm gray brown patch slight irregular pigment Lentigo vs sk r/o atypia     Objective  Right Upper Elbow: 3.88mm speckled macule Nevus r/o dysplasia       Objective  Cheeks, nose: Mild erythema.  Objective  Bil feet: Scaling of the toes and left 4th webspace.   Assessment & Plan   Skin cancer screening performed today.  Actinic Damage - diffuse scaly erythematous macules with underlying dyspigmentation - Recommend daily broad spectrum sunscreen SPF 30+ to sun-exposed areas, reapply every 2 hours as needed.  - Call for new or changing lesions.  Seborrheic Keratoses - Stuck-on, waxy, tan-brown papules and plaques  - Discussed benign etiology and prognosis. - Observe - Call for any  changes  Melanocytic Nevi - Tan-brown and/or pink-flesh-colored symmetric macules and papules - Benign appearing on exam today - Observation - Call clinic for new or changing moles - Recommend daily use of broad spectrum spf 30+ sunscreen to sun-exposed areas.   Hemangiomas - Red papules - Discussed benign nature - Observe - Call for any changes   History of melanoma in situ Right Medial Knee  Clear. Observe for recurrence. Call clinic for new or changing lesions.  Recommend regular skin exams, daily broad-spectrum spf 30+ sunscreen use, and photoprotection.     History of basal cell carcinoma (BCC) Right Forehead  Clear. Observe for recurrence. Call clinic for new or changing lesions.  Recommend regular skin exams, daily broad-spectrum spf 30+ sunscreen use, and photoprotection.     Rash Left Posterior Thigh  LSC +/- tinea  Start ketoconazole 2% cream BID- pt has Avoid rubbing  Inflamed seborrheic keratosis Left Anterior Thigh x 1, Mid abd x 1  Destruction of lesion - Left Anterior Thigh x 1, Mid abd x 1  Destruction method: cryotherapy   Destruction method comment:  Electrodessication Informed consent: discussed and consent obtained   Lesion destroyed using liquid nitrogen: Yes   Region frozen until ice ball extended beyond lesion: Yes   Outcome: patient tolerated procedure well with no complications   Post-procedure details: wound care instructions given   Additional details:  Prior to procedure, discussed risks of blister formation, small wound, skin dyspigmentation, or rare scar following cryotherapy.  Neoplasm of uncertain behavior of skin (2) Right Lower Cheek  Skin / nail biopsy Type of biopsy: tangential   Informed consent: discussed and consent obtained   Patient was prepped and draped in usual sterile fashion: Area prepped with alcohol. Anesthesia: the lesion was anesthetized in a standard fashion   Anesthetic:  1% lidocaine w/ epinephrine  1-100,000 buffered w/ 8.4% NaHCO3 Instrument used: flexible razor blade   Hemostasis achieved with: pressure, aluminum chloride and electrodesiccation   Outcome: patient tolerated procedure well   Post-procedure details: wound care instructions given   Post-procedure details comment:  Ointment and small bandage applied  Specimen 1 - Surgical pathology Differential Diagnosis: Lentigo vs SK r/o Atypia Check Margins: No 1.0 cm gray brown patch slight irregular pigment  Right Upper Elbow  Epidermal / dermal shaving  Lesion diameter (cm):  0.3 Informed consent: discussed and consent obtained   Patient was prepped and draped in usual sterile fashion: Area prepped with alcohol. Anesthesia: the lesion was anesthetized in a standard fashion   Anesthetic:  1% lidocaine w/ epinephrine 1-100,000 buffered w/ 8.4% NaHCO3 Instrument used: flexible razor blade   Hemostasis achieved with: pressure, aluminum chloride and electrodesiccation   Outcome: patient tolerated procedure well   Post-procedure details: wound care instructions given   Post-procedure details comment:  Ointment and small bandage applied Additional details:  0.6 cm post treatment defect  Specimen 2 - Surgical pathology Differential Diagnosis: Nevus r/o Dysplasia Check Margins: Yes 3.35mm speckled macule   Rosacea Cheeks, nose  Continue Metronidazole 0.75% Gel qd to face.  Tinea pedis of both feet Bil feet  Continue Ketoconazole 2% Cream Apply to feet and between toes qhs.  Recommend Eucerin Roughness Relief Spot Treatment qd. Sample given of Roughness Relief Lotion.  Return in about 6 months (around 02/18/2021) for Hx MMIS, recheck rash thigh.   IJamesetta Orleans, CMA, am acting as scribe for Brendolyn Patty, MD .  Documentation: I have reviewed the above documentation for accuracy and completeness, and I agree with the above.  Brendolyn Patty MD  Documentation: I have reviewed the above documentation for accuracy and  completeness, and I agree with the above.  Brendolyn Patty MD

## 2020-08-20 NOTE — Patient Instructions (Addendum)
Eucerin Roughness Relief Spot Treatment - Apply to rough spots on feet once daily.  Ketoconazole 2% Cream - Apply to feet and between toes every evening. Apply to rash on back of left thigh twice daily until improved  Wound Care Instructions  1. Cleanse wound gently with soap and water once a day then pat dry with clean gauze. Apply a thing coat of Petrolatum (petroleum jelly, "Vaseline") over the wound (unless you have an allergy to this). We recommend that you use a new, sterile tube of Vaseline. Do not pick or remove scabs. Do not remove the yellow or white "healing tissue" from the base of the wound.  2. Cover the wound with fresh, clean, nonstick gauze and secure with paper tape. You may use Band-Aids in place of gauze and tape if the would is small enough, but would recommend trimming much of the tape off as there is often too much. Sometimes Band-Aids can irritate the skin.  3. You should call the office for your biopsy report after 1 week if you have not already been contacted.  4. If you experience any problems, such as abnormal amounts of bleeding, swelling, significant bruising, significant pain, or evidence of infection, please call the office immediately.  5. FOR ADULT SURGERY PATIENTS: If you need something for pain relief you may take 1 extra strength Tylenol (acetaminophen) AND 2 Ibuprofen (200mg  each) together every 4 hours as needed for pain. (do not take these if you are allergic to them or if you have a reason you should not take them.) Typically, you may only need pain medication for 1 to 3 days.

## 2020-08-23 ENCOUNTER — Other Ambulatory Visit: Payer: Self-pay

## 2020-08-23 DIAGNOSIS — C439 Malignant melanoma of skin, unspecified: Secondary | ICD-10-CM

## 2020-08-23 NOTE — Progress Notes (Signed)
Referral to Dr. Lacinda Axon for [facility] for right lower cheek MALIGNANT MELANOMA- [place] 0.9mm, [place] Level II/sh

## 2020-08-27 ENCOUNTER — Other Ambulatory Visit: Payer: Self-pay

## 2020-08-27 ENCOUNTER — Encounter: Payer: Self-pay | Admitting: Internal Medicine

## 2020-08-27 ENCOUNTER — Ambulatory Visit: Payer: Medicare PPO | Admitting: Internal Medicine

## 2020-08-27 VITALS — BP 136/76 | HR 78 | Temp 97.8°F | Resp 15 | Ht 69.0 in | Wt 194.0 lb

## 2020-08-27 DIAGNOSIS — E785 Hyperlipidemia, unspecified: Secondary | ICD-10-CM | POA: Diagnosis not present

## 2020-08-27 DIAGNOSIS — R5383 Other fatigue: Secondary | ICD-10-CM

## 2020-08-27 DIAGNOSIS — Z85820 Personal history of malignant melanoma of skin: Secondary | ICD-10-CM

## 2020-08-27 DIAGNOSIS — J431 Panlobular emphysema: Secondary | ICD-10-CM

## 2020-08-27 DIAGNOSIS — Z23 Encounter for immunization: Secondary | ICD-10-CM | POA: Diagnosis not present

## 2020-08-27 DIAGNOSIS — E559 Vitamin D deficiency, unspecified: Secondary | ICD-10-CM

## 2020-08-27 DIAGNOSIS — Z0000 Encounter for general adult medical examination without abnormal findings: Secondary | ICD-10-CM

## 2020-08-27 DIAGNOSIS — E538 Deficiency of other specified B group vitamins: Secondary | ICD-10-CM

## 2020-08-27 DIAGNOSIS — J439 Emphysema, unspecified: Secondary | ICD-10-CM | POA: Insufficient documentation

## 2020-08-27 DIAGNOSIS — I2510 Atherosclerotic heart disease of native coronary artery without angina pectoris: Secondary | ICD-10-CM | POA: Insufficient documentation

## 2020-08-27 LAB — COMPREHENSIVE METABOLIC PANEL
ALT: 12 U/L (ref 0–53)
AST: 13 U/L (ref 0–37)
Albumin: 4.1 g/dL (ref 3.5–5.2)
Alkaline Phosphatase: 59 U/L (ref 39–117)
BUN: 20 mg/dL (ref 6–23)
CO2: 28 mEq/L (ref 19–32)
Calcium: 8.9 mg/dL (ref 8.4–10.5)
Chloride: 104 mEq/L (ref 96–112)
Creatinine, Ser: 1.21 mg/dL (ref 0.40–1.50)
GFR: 52.26 mL/min — ABNORMAL LOW (ref >60.00)
Glucose, Bld: 96 mg/dL (ref 70–99)
Potassium: 4.6 mEq/L (ref 3.5–5.1)
Sodium: 138 mEq/L (ref 135–145)
Total Bilirubin: 0.9 mg/dL (ref 0.2–1.2)
Total Protein: 6.3 g/dL (ref 6.0–8.3)

## 2020-08-27 LAB — CBC WITH DIFFERENTIAL/PLATELET
Basophils Absolute: 0 10*3/uL (ref 0.0–0.1)
Basophils Relative: 0.7 % (ref 0.0–3.0)
Eosinophils Absolute: 0.1 10*3/uL (ref 0.0–0.7)
Eosinophils Relative: 2.7 % (ref 0.0–5.0)
HCT: 43.7 % (ref 39.0–52.0)
Hemoglobin: 14.7 g/dL (ref 13.0–17.0)
Lymphocytes Relative: 21.9 % (ref 12.0–46.0)
Lymphs Abs: 1.2 10*3/uL (ref 0.7–4.0)
MCHC: 33.7 g/dL (ref 30.0–36.0)
MCV: 95 fl (ref 78.0–100.0)
Monocytes Absolute: 0.5 10*3/uL (ref 0.1–1.0)
Monocytes Relative: 9.5 % (ref 3.0–12.0)
Neutro Abs: 3.5 10*3/uL (ref 1.4–7.7)
Neutrophils Relative %: 65.2 % (ref 43.0–77.0)
Platelets: 225 10*3/uL (ref 150.0–400.0)
RBC: 4.6 Mil/uL (ref 4.22–5.81)
RDW: 13.3 % (ref 11.5–15.5)
WBC: 5.4 10*3/uL (ref 4.0–10.5)

## 2020-08-27 LAB — LIPID PANEL
Cholesterol: 165 mg/dL (ref 0–200)
HDL: 53.4 mg/dL (ref >39.00)
LDL Cholesterol: 83 mg/dL (ref 0–99)
NonHDL: 111.67
Total CHOL/HDL Ratio: 3
Triglycerides: 141 mg/dL (ref 0.0–149.0)
VLDL: 28.2 mg/dL (ref 0.0–40.0)

## 2020-08-27 LAB — TSH: TSH: 4.04 u[IU]/mL (ref 0.35–4.50)

## 2020-08-27 LAB — VITAMIN D 25 HYDROXY (VIT D DEFICIENCY, FRACTURES): VITD: 18.3 ng/mL — ABNORMAL LOW (ref 30.00–100.00)

## 2020-08-27 LAB — VITAMIN B12: Vitamin B-12: 196 pg/mL — ABNORMAL LOW (ref 211–911)

## 2020-08-27 NOTE — Assessment & Plan Note (Signed)

## 2020-08-27 NOTE — Assessment & Plan Note (Addendum)
Recurrent, with recent positive biopsy , right face.  Being referred for Moh's procedure by dermatologist to [facility]

## 2020-08-27 NOTE — Patient Instructions (Signed)
Good to see you!  I recommend that you start going for a walk every day with Mrs Cleary  Stretching your legs and gluteus muscles (buttocks) after your walk is very beneficial   Health Maintenance After Age 84 After age 63, you are at a higher risk for certain long-term diseases and infections as well as injuries from falls. Falls are a major cause of broken bones and head injuries in people who are older than age 24. Getting regular preventive care can help to keep you healthy and well. Preventive care includes getting regular testing and making lifestyle changes as recommended by your health care provider. Talk with your health care provider about:  Which screenings and tests you should have. A screening is a test that checks for a disease when you have no symptoms.  A diet and exercise plan that is right for you. What should I know about screenings and tests to prevent falls? Screening and testing are the best ways to find a health problem early. Early diagnosis and treatment give you the best chance of managing medical conditions that are common after age 84. Certain conditions and lifestyle choices may make you more likely to have a fall. Your health care provider may recommend:  Regular vision checks. Poor vision and conditions such as cataracts can make you more likely to have a fall. If you wear glasses, make sure to get your prescription updated if your vision changes.  Medicine review. Work with your health care provider to regularly review all of the medicines you are taking, including over-the-counter medicines. Ask your health care provider about any side effects that may make you more likely to have a fall. Tell your health care provider if any medicines that you take make you feel dizzy or sleepy.  Osteoporosis screening. Osteoporosis is a condition that causes the bones to get weaker. This can make the bones weak and cause them to break more easily.  Blood pressure screening.  Blood pressure changes and medicines to control blood pressure can make you feel dizzy.  Strength and balance checks. Your health care provider may recommend certain tests to check your strength and balance while standing, walking, or changing positions.  Foot health exam. Foot pain and numbness, as well as not wearing proper footwear, can make you more likely to have a fall.  Depression screening. You may be more likely to have a fall if you have a fear of falling, feel emotionally low, or feel unable to do activities that you used to do.  Alcohol use screening. Using too much alcohol can affect your balance and may make you more likely to have a fall. What actions can I take to lower my risk of falls? General instructions  Talk with your health care provider about your risks for falling. Tell your health care provider if: ? You fall. Be sure to tell your health care provider about all falls, even ones that seem minor. ? You feel dizzy, sleepy, or off-balance.  Take over-the-counter and prescription medicines only as told by your health care provider. These include any supplements.  Eat a healthy diet and maintain a healthy weight. A healthy diet includes low-fat dairy products, low-fat (lean) meats, and fiber from whole grains, beans, and lots of fruits and vegetables. Home safety  Remove any tripping hazards, such as rugs, cords, and clutter.  Install safety equipment such as grab bars in bathrooms and safety rails on stairs.  Keep rooms and walkways well-lit. Activity  Follow a regular exercise program to stay fit. This will help you maintain your balance. Ask your health care provider what types of exercise are appropriate for you.  If you need a cane or walker, use it as recommended by your health care provider.  Wear supportive shoes that have nonskid soles. Lifestyle  Do not drink alcohol if your health care provider tells you not to drink.  If you drink alcohol, limit  how much you have: ? 0-1 drink a day for women. ? 0-2 drinks a day for men.  Be aware of how much alcohol is in your drink. In the [place] one drink equals one typical bottle of beer (12 oz), one-half glass of wine (5 oz), or one shot of hard liquor (1 oz).  Do not use any products that contain nicotine or tobacco, such as cigarettes and e-cigarettes. If you need help quitting, ask your health care provider. Summary  Having a healthy lifestyle and getting preventive care can help to protect your health and wellness after age 63.  Screening and testing are the best way to find a health problem early and help you avoid having a fall. Early diagnosis and treatment give you the best chance for managing medical conditions that are more common for people who are older than age 3.  Falls are a major cause of broken bones and head injuries in people who are older than age 56. Take precautions to prevent a fall at home.  Work with your health care provider to learn what changes you can make to improve your health and wellness and to prevent falls. This information is not intended to replace advice given to you by your health care provider. Make sure you discuss any questions you have with your health care provider. Document Revised: 02/24/2019 Document Reviewed: 09/16/2017 Elsevier Patient Education  2020 [employer].

## 2020-08-27 NOTE — Progress Notes (Signed)
Patient ID: Jon Sherman., male    DOB: January 10, 1928  Age: 86 y.o. MRN: [id number]  The patient is here for annual wellness examination and management of other chronic and acute problems.  This visit occurred during the SARS-CoV-2 public health emergency.  Safety protocols were in place, including screening questions prior to the visit, additional usage of staff PPE, and extensive cleaning of exam room while observing appropriate contact time as indicated for disinfecting solutions.    Patient has received both doses of the available COVID 19 vaccine without complications.  Patient continues to mask when outside of the home except when walking in yard or at safe distances from others .  Patient denies any change in mood or development of unhealthy behaviors resuting from the pandemic's restriction of activities and socialization.     The risk factors are reflected in the social history.  The roster of all physicians providing medical care to patient - is listed in the Snapshot section of the chart.  Activities of daily living:  The patient is 100% independent in all ADLs: dressing, toileting, feeding as well as independent mobility  Home safety : The patient has smoke detectors in the home. They wear seatbelts.  There are no firearms at home. There is no violence in the home.   There is no risks for hepatitis, STDs or HIV. There is no   history of blood transfusion. They have no travel history to infectious disease endemic areas of the world.  The patient has seen their dentist in the last six month. They have seen their eye doctor in the last year. They admit to slight hearing difficulty with regard to whispered voices and some television programs.  They have deferred audiologic testing in the last year.  They do not  have excessive sun exposure. Discussed the need for sun protection: hats, long sleeves and use of sunscreen if there is significant sun exposure.   Diet: the importance of a  healthy diet is discussed. They do have a healthy diet.  The benefits of regular aerobic exercise were discussed. He is not walking regularly with no particular reason why.   Depression screen: there are no signs or vegative symptoms of depression- irritability, change in appetite, anhedonia, sadness/tearfullness.  Cognitive assessment: the patient manages all their financial and personal affairs and is actively engaged. They could relate day,date,year and events; recalled 2/3 objects at 3 minutes; performed clock-face test normally.  The following portions of the patient's history were reviewed and updated as appropriate: allergies, current medications, past family history, past medical history,  past surgical history, past social history  and problem list.  Visual acuity was not assessed per patient preference since she has regular follow up with her ophthalmologist. Hearing and body mass index were assessed and reviewed.   During the course of the visit the patient was educated and counseled about appropriate screening and preventive services including : fall prevention , diabetes screening, nutrition counseling, colorectal cancer screening, and recommended immunizations.    CC: The primary encounter diagnosis was B12 deficiency. Diagnoses of Vitamin D deficiency, Fatigue, unspecified type, Dyslipidemia, Need for immunization against influenza, Routine general medical examination at a health care facility, History of melanoma, Atherosclerosis of native coronary artery of native heart without angina pectoris, and Panlobular emphysema ([place]) were also pertinent to this visit.  "I've been feeling 67 lately."  Lost another family member  Who died on hospice  . Also getting worried about Jon Sherman's memory failing  Diagnosed with  Melanoma by biopsy right cheek . Referred to [facility]  History of emphysema noted on June 2019 CT:  He denies cough, baseline or exertional  shortness of breath and takes no  medication for lung disease  No nocturia  History Jon Sherman has a past medical history of Basal cell carcinoma (06/29/2018), Cancer ([place]) (1965), COPD (chronic obstructive pulmonary disease) ([place]), Infectious colitis (2000), Influenza (02/18/2018), Melanoma in situ of right lower extremity ([place]) (07/05/2018), Rosacea, and Vertigo.   He has a past surgical history that includes Melanoma excision with sentinel lymph node dissection; Soft Tissue Tumor Resection (1985); Cataract extraction w/PHACO (Right, 06/29/2019); and Cataract extraction w/PHACO (Left, 08/31/2019).   His family history includes Heart failure in his father and mother; Mental illness in his mother; Rheumatic fever in his sister.He reports that he quit smoking about 58 years ago. His smoking use included cigarettes. He has a 10.00 pack-year smoking history. He has never used smokeless tobacco. He reports current alcohol use of about 7.0 standard drinks of alcohol per week. He reports that he does not use drugs.  Outpatient Medications Prior to Visit  Medication Sig Dispense Refill  . metroNIDAZOLE (METROGEL) 0.75 % gel APPLY A SMALL AMOUNT TO AFFECTED AREA EVERY MORNING 45 g 2  . Multiple Vitamins-Minerals (PRESERVISION AREDS 2) CAPS Take 1 capsule by mouth 2 (two) times daily.     No facility-administered medications prior to visit.    Review of Systems  Patient denies headache, fevers, malaise, unintentional weight loss, skin rash, eye pain, sinus congestion and sinus pain, sore throat, dysphagia,  hemoptysis , cough, dyspnea, wheezing, chest pain, palpitations, orthopnea, edema, abdominal pain, nausea, melena, diarrhea, constipation, flank pain, dysuria, hematuria, urinary  Frequency, nocturia, numbness, tingling, seizures,  Focal weakness, Loss of consciousness,  Tremor, insomnia, depression, anxiety, and suicidal ideation.     Objective:  BP 136/76 (BP Location: Left Arm, Patient Position: Sitting, Cuff Size: Normal)   Pulse 78    Temp 97.8 F (36.6 C) (Oral)   Resp 15   Ht 5\' 9"  (1.753 m)   Wt 194 lb (88 kg)   SpO2 97%   BMI 28.65 kg/m   Physical Exam  General appearance: alert, cooperative and appears stated age Ears: normal TM's and external ear canals both ears Throat: lips, mucosa, and tongue normal; teeth and gums normal Neck: no adenopathy, no carotid bruit, supple, symmetrical, trachea midline and thyroid not enlarged, symmetric, no tenderness/mass/nodules Back: symmetric, no curvature. ROM normal. No CVA tenderness. Lungs: clear to auscultation bilaterally Heart: regular rate and rhythm, S1, S2 normal, no murmur, click, rub or gallop Abdomen: soft, non-tender; bowel sounds normal; no masses,  no organomegaly Pulses: 2+ and symmetric Skin: Skin color, texture, turgor normal. No rashes or lesions Lymph nodes: Cervical, supraclavicular, and axillary nodes normal.  Assessment & Plan:   Problem List Items Addressed This Visit      Unprioritized   Atherosclerosis of native coronary artery    Noted on 2019 Chest CT.  Patient is asymptomatic and has no history of hypertension, hyperlipidemia, or chest pain.  Given his age, No further workup unless he develops symptoms.   Lab Results  Component Value Date   CHOL 148 08/26/2019   HDL 49 08/26/2019   LDLCALC 75 08/26/2019   LDLDIRECT 81.0 06/04/2017   TRIG 162 (H) 08/26/2019   CHOLHDL 3.0 08/26/2019         Emphysema of lung ([place])    Noted on chest CT done in 2019.  He has  a remote tobacco history and is asymptomatic.       History of melanoma    Recurrent, with recent positive biopsy , right face.  Being referred for Moh's procedure by dermatologist to [facility]      Routine general medical examination at a health care facility    age appropriate education and counseling updated, referrals for preventative services and immunizations addressed, dietary and smoking counseling addressed, most recent labs reviewed.  I have personally reviewed  and have noted:  1) the patient's medical and social history 2) The pt's use of alcohol, tobacco, and illicit drugs 3) The patient's current medications and supplements 4) Functional ability including ADL's, fall risk, home safety risk, hearing and visual impairment 5) Diet and physical activities 6) Evidence for depression or mood disorder 7) The patient's height, weight, and BMI have been recorded in the chart  I have made referrals, and provided counseling and education based on review of the above      Vitamin D deficiency   Relevant Orders   VITAMIN D 25 Hydroxy (Vit-D Deficiency, Fractures)   Vitamin B12    Other Visit Diagnoses    B12 deficiency    -  Primary   Relevant Orders   Vitamin B12   Fatigue, unspecified type       Relevant Orders   CBC with Differential/Platelet   TSH   Comprehensive metabolic panel   Dyslipidemia       Relevant Orders   Lipid panel   Need for immunization against influenza       Relevant Orders   Flu Vaccine QUAD High Dose(Fluad) (Completed)      I am having [name] maintain his PreserVision AREDS 2 and metroNIDAZOLE.  No orders of the defined types were placed in this encounter.   There are no discontinued medications.  Follow-up: No follow-ups on file.   Crecencio Mc, MD

## 2020-08-27 NOTE — Assessment & Plan Note (Signed)
Noted on chest CT done in 2019.  He has a remote tobacco history and is asymptomatic.

## 2020-08-27 NOTE — Assessment & Plan Note (Signed)
Noted on 2019 Chest CT.  Patient is asymptomatic and has no history of hypertension, hyperlipidemia, or chest pain.  Given his age, No further workup unless he develops symptoms.   Lab Results  Component Value Date   CHOL 148 08/26/2019   HDL 49 08/26/2019   LDLCALC 75 08/26/2019   LDLDIRECT 81.0 06/04/2017   TRIG 162 (H) 08/26/2019   CHOLHDL 3.0 08/26/2019

## 2020-08-28 ENCOUNTER — Other Ambulatory Visit: Payer: Self-pay | Admitting: Internal Medicine

## 2020-08-28 ENCOUNTER — Encounter: Payer: Self-pay | Admitting: Internal Medicine

## 2020-08-28 DIAGNOSIS — E538 Deficiency of other specified B group vitamins: Secondary | ICD-10-CM

## 2020-08-28 MED ORDER — ERGOCALCIFEROL 1.25 MG (50000 UT) PO CAPS: 50000.0000 [IU] | ORAL_CAPSULE | ORAL | 0 refills | Status: DC

## 2020-08-28 NOTE — Progress Notes (Signed)
Labs are normal EXCEPT   B12 AND VITAMIN D ARE BOTH LOW  Please schedule RN visits and Sherman visit for additional labs and b12 injections weekly x 3   Vitamin D sent to pharmacy  weekly dosing for 3 months.    after you finish the weekly Vitamin D supplement, you should start taking an OTC  Vit D3 supplement 1000 units daily, November through April. Jon Sherman,      Jon Medina, MD

## 2020-09-04 ENCOUNTER — Telehealth: Payer: Self-pay

## 2020-09-04 NOTE — Telephone Encounter (Signed)
LMTCB in regards to lab results.  

## 2020-09-06 ENCOUNTER — Other Ambulatory Visit: Payer: Self-pay | Admitting: Dermatology

## 2020-09-12 ENCOUNTER — Ambulatory Visit (INDEPENDENT_AMBULATORY_CARE_PROVIDER_SITE_OTHER): Payer: Medicare PPO

## 2020-09-12 ENCOUNTER — Other Ambulatory Visit: Payer: Self-pay

## 2020-09-12 DIAGNOSIS — E538 Deficiency of other specified B group vitamins: Secondary | ICD-10-CM

## 2020-09-12 MED ORDER — CYANOCOBALAMIN 1000 MCG/ML IJ SOLN
1000.0000 ug | Freq: Once | INTRAMUSCULAR | Status: AC
  Administered 2020-09-12: 1000 ug via INTRAMUSCULAR

## 2020-09-12 NOTE — Progress Notes (Signed)
Patient presented for B 12 injection to left deltoid, patient voiced no concerns nor showed any signs of distress during injection. 

## 2020-09-19 ENCOUNTER — Ambulatory Visit (INDEPENDENT_AMBULATORY_CARE_PROVIDER_SITE_OTHER): Payer: Medicare PPO

## 2020-09-19 ENCOUNTER — Other Ambulatory Visit: Payer: Self-pay

## 2020-09-19 DIAGNOSIS — E538 Deficiency of other specified B group vitamins: Secondary | ICD-10-CM

## 2020-09-19 LAB — INTRINSIC FACTOR ANTIBODIES: Intrinsic Factor: NEGATIVE

## 2020-09-19 MED ORDER — CYANOCOBALAMIN 1000 MCG/ML IJ SOLN
1000.0000 ug | Freq: Once | INTRAMUSCULAR | Status: AC
  Administered 2020-09-19: 1000 ug via INTRAMUSCULAR

## 2020-09-19 NOTE — Progress Notes (Signed)
Patient presented for B 12 injection to left deltoid, patient voiced no concerns nor showed any signs of distress during injection. 

## 2020-09-20 NOTE — Progress Notes (Signed)
  Your intrinisic factor antibody was negative,  So that means you will be able to continue treating your B12 deficiency  with oral tablets once you have completed any injection schedule I have started you on . (needs 3 weekly ones minimum before switching to 1000 mcg daily oral dose)  Regards.    Deborra Medina, MD

## 2020-09-26 ENCOUNTER — Other Ambulatory Visit: Payer: Self-pay

## 2020-09-26 ENCOUNTER — Ambulatory Visit (INDEPENDENT_AMBULATORY_CARE_PROVIDER_SITE_OTHER): Payer: Medicare PPO

## 2020-09-26 DIAGNOSIS — E538 Deficiency of other specified B group vitamins: Secondary | ICD-10-CM | POA: Diagnosis not present

## 2020-09-26 MED ORDER — CYANOCOBALAMIN 1000 MCG/ML IJ SOLN
1000.0000 ug | Freq: Once | INTRAMUSCULAR | Status: AC
  Administered 2020-09-26: 1000 ug via INTRAMUSCULAR

## 2020-09-26 NOTE — Progress Notes (Signed)
Patient presented for B 12 injection to left deltoid, patient voiced no concerns nor showed any signs of distress during injection. 

## 2020-09-28 DIAGNOSIS — C4339 Malignant melanoma of other parts of face: Secondary | ICD-10-CM | POA: Diagnosis not present

## 2020-10-09 ENCOUNTER — Ambulatory Visit: Payer: Medicare PPO | Admitting: Dermatology

## 2020-10-09 ENCOUNTER — Other Ambulatory Visit: Payer: Self-pay

## 2020-10-09 DIAGNOSIS — L300 Nummular dermatitis: Secondary | ICD-10-CM

## 2020-10-09 DIAGNOSIS — Z85820 Personal history of malignant melanoma of skin: Secondary | ICD-10-CM

## 2020-10-09 DIAGNOSIS — D039 Melanoma in situ, unspecified: Secondary | ICD-10-CM

## 2020-10-09 DIAGNOSIS — D485 Neoplasm of uncertain behavior of skin: Secondary | ICD-10-CM

## 2020-10-09 DIAGNOSIS — D0321 Melanoma in situ of right ear and external auricular canal: Secondary | ICD-10-CM | POA: Diagnosis not present

## 2020-10-09 HISTORY — DX: Melanoma in situ, unspecified: D03.9

## 2020-10-09 MED ORDER — TRIAMCINOLONE ACETONIDE 0.1 % EX CREA: 1.0000 "application " | TOPICAL_CREAM | Freq: Two times a day (BID) | CUTANEOUS | 2 refills | Status: DC | PRN

## 2020-10-09 NOTE — Progress Notes (Signed)
   Follow-Up Visit   Subjective  Jon Sherman. is a 86 y.o. male who presents for the following: Follow-up.  Has dark spot on R ear that Dr. Lacinda Axon noticed when he had his melanoma surgery.  Also has pink spot on thigh.    The following portions of the chart were reviewed this encounter and updated as appropriate:     Review of Systems: No other skin or systemic complaints except as noted in HPI or Assessment and Plan.   Objective  Well appearing patient in no apparent distress; mood and affect are within normal limits.  A focused examination was performed including face, right ear, legs. Relevant physical exam findings are noted in the Assessment and Plan.  Objective  Right lower cheek: Well healed scar with no evidence of recurrence  Objective  Right Ear helix: 9 x 5 mm brown macule with irregular pigment     Objective  Right Thigh - Anterior, left posterior thigh: Pink scaly patches, with lichenification L post thigh  Assessment & Plan  History of melanoma Right lower cheek  Biopsy proven Malignant Melanoma- Breslow 0.56mm, Clarks Level II, Post MOHs treatment  Clear. Observe for recurrence. Call clinic for new or changing lesions.  Recommend regular skin exams, daily broad-spectrum spf 30+ sunscreen use, and photoprotection.     Neoplasm of uncertain behavior of skin Right Ear helix  Epidermal / dermal shaving  Lesion diameter (cm):  1.3 Informed consent: discussed and consent obtained   Patient was prepped and draped in usual sterile fashion: Area prepped with alcohol. Anesthesia: the lesion was anesthetized in a standard fashion   Anesthetic:  1% lidocaine w/ epinephrine 1-100,000 buffered w/ 8.4% NaHCO3 Instrument used: flexible razor blade   Hemostasis achieved with: pressure, aluminum chloride and electrodesiccation   Outcome: patient tolerated procedure well   Post-procedure details: wound care instructions given   Post-procedure details comment:   Ointment and small bandage applied  Specimen 1 - Surgical pathology Differential Diagnosis: SK vs lentigo, r/o atypia Check Margins: No 9 x 5 mm brown macule with irregular pigment  Nummular dermatitis Right Thigh - Anterior, left posterior thigh  Chronic dermatitis, not at goal Start Triamcinolone to affected areas 1-2 times daily until rash clears. Avoid f/g/a  Recheck L post thigh on f/up  Recommend mild soap and moisturizing cream 1-2 times daily.  Samples given  triamcinolone (KENALOG) 0.1 % - Right Thigh - Anterior, left posterior thigh  Return for as scheduled or sooner pending biopsy.   I, Harriett Sine, CMA, am acting as scribe for Brendolyn Patty, MD.  Documentation: I have reviewed the above documentation for accuracy and completeness, and I agree with the above.  Brendolyn Patty MD

## 2020-10-09 NOTE — Patient Instructions (Signed)

## 2020-10-16 ENCOUNTER — Other Ambulatory Visit: Payer: Self-pay

## 2020-10-16 DIAGNOSIS — D0321 Melanoma in situ of right ear and external auricular canal: Secondary | ICD-10-CM

## 2020-10-23 DIAGNOSIS — H353211 Exudative age-related macular degeneration, right eye, with active choroidal neovascularization: Secondary | ICD-10-CM | POA: Diagnosis not present

## 2020-10-30 DIAGNOSIS — H40003 Preglaucoma, unspecified, bilateral: Secondary | ICD-10-CM | POA: Diagnosis not present

## 2020-11-13 ENCOUNTER — Other Ambulatory Visit: Payer: Self-pay | Admitting: Internal Medicine

## 2020-12-10 DIAGNOSIS — D0321 Melanoma in situ of right ear and external auricular canal: Secondary | ICD-10-CM | POA: Diagnosis not present

## 2021-01-07 ENCOUNTER — Other Ambulatory Visit: Payer: Self-pay | Admitting: Dermatology

## 2021-01-22 DIAGNOSIS — H353122 Nonexudative age-related macular degeneration, left eye, intermediate dry stage: Secondary | ICD-10-CM | POA: Diagnosis not present

## 2021-01-28 DIAGNOSIS — D0321 Melanoma in situ of right ear and external auricular canal: Secondary | ICD-10-CM | POA: Diagnosis not present

## 2021-02-19 ENCOUNTER — Other Ambulatory Visit: Payer: Self-pay

## 2021-02-19 ENCOUNTER — Encounter: Payer: Self-pay | Admitting: Dermatology

## 2021-02-19 ENCOUNTER — Ambulatory Visit: Payer: Medicare PPO | Admitting: Dermatology

## 2021-02-19 DIAGNOSIS — Z85828 Personal history of other malignant neoplasm of skin: Secondary | ICD-10-CM | POA: Diagnosis not present

## 2021-02-19 DIAGNOSIS — L820 Inflamed seborrheic keratosis: Secondary | ICD-10-CM | POA: Diagnosis not present

## 2021-02-19 DIAGNOSIS — Z1283 Encounter for screening for malignant neoplasm of skin: Secondary | ICD-10-CM

## 2021-02-19 DIAGNOSIS — L853 Xerosis cutis: Secondary | ICD-10-CM | POA: Diagnosis not present

## 2021-02-19 DIAGNOSIS — D229 Melanocytic nevi, unspecified: Secondary | ICD-10-CM

## 2021-02-19 DIAGNOSIS — Z85820 Personal history of malignant melanoma of skin: Secondary | ICD-10-CM

## 2021-02-19 DIAGNOSIS — D225 Melanocytic nevi of trunk: Secondary | ICD-10-CM

## 2021-02-19 DIAGNOSIS — L814 Other melanin hyperpigmentation: Secondary | ICD-10-CM

## 2021-02-19 DIAGNOSIS — L821 Other seborrheic keratosis: Secondary | ICD-10-CM | POA: Diagnosis not present

## 2021-02-19 DIAGNOSIS — L578 Other skin changes due to chronic exposure to nonionizing radiation: Secondary | ICD-10-CM

## 2021-02-19 DIAGNOSIS — L300 Nummular dermatitis: Secondary | ICD-10-CM

## 2021-02-19 DIAGNOSIS — D1800 Hemangioma unspecified site: Secondary | ICD-10-CM

## 2021-02-19 DIAGNOSIS — Z86006 Personal history of melanoma in-situ: Secondary | ICD-10-CM

## 2021-02-19 NOTE — Progress Notes (Signed)
Follow-Up Visit   Subjective  Jon Sherman. is a 87 y.o. male who presents for the following: Follow-up (Patient is here today for follow up. He has history of melanoma in situ of right ear helix and history of right lower cheek. ) MALIGNANT MELANOMA- Breslow 0.90mm, Clarks Level II, 08/20/20, both treated with Mohs at [facility].  Also h/o Melanoma IS R medial knee 2019.  He also has a scaly growth that itches on his sacral area.  Patient here for full body skin exam and skin cancer screening.   The following portions of the chart were reviewed this encounter and updated as appropriate:      Objective  Well appearing patient in no apparent distress; mood and affect are within normal limits.  A full examination was performed including scalp, head, eyes, ears, nose, lips, neck, chest, axillae, abdomen, back, buttocks, bilateral upper extremities, bilateral lower extremities, hands, feet, fingers, toes, fingernails, and toenails. All findings within normal limits unless otherwise noted below.  Objective  Right spinal upper back: 4 mm brown macule   Objective  Right and left anterior thigh: Clear today   Objective  bilateral legs and feet: Dry flaky skin   Objective  superior gluteal cleft: Erythematous keratotic or waxy stuck-on papule   Assessment & Plan  Nevus Right spinal upper back  Benign-appearing.  Observation.  Call clinic for new or changing lesions.  Recommend daily use of broad spectrum spf 30+ sunscreen to sun-exposed areas.    Nummular dermatitis Right and left anterior thigh  Recommend  d/c tmc 0.1 % cream at this time. Patient is clear   Recommend mild soap and moisturizing cream 1-2 times daily.  Gentle skin care handout provided.    Topical steroids (such as triamcinolone, fluocinolone, fluocinonide, mometasone, clobetasol, halobetasol, betamethasone, hydrocortisone) can cause thinning and lightening of the skin if they are used for too long in the same  area. Your physician has selected the right strength medicine for your problem and area affected on the body. Please use your medication only as directed by your physician to prevent side effects.    Other Related Medications triamcinolone (KENALOG) 0.1 %  Xerosis cutis bilateral legs and feet  Recommend mild soap and moisturizing cream 1-2 times daily.  Gentle skin care handout provided.    Inflamed seborrheic keratosis superior gluteal cleft  Prior to procedure, discussed risks of blister formation, small wound, skin dyspigmentation, or rare scar following cryotherapy.    Destruction of lesion - superior gluteal cleft  Destruction method: cryotherapy   Informed consent: discussed and consent obtained   Lesion destroyed using liquid nitrogen: Yes   Region frozen until ice ball extended beyond lesion: Yes   Outcome: patient tolerated procedure well with no complications   Post-procedure details: wound care instructions given     Lentigines - Scattered tan macules - Due to sun exposure - Benign-appering, observe - Recommend daily broad spectrum sunscreen SPF 30+ to sun-exposed areas, reapply every 2 hours as needed. - Call for any changes  Seborrheic Keratoses - Stuck-on, waxy, tan-brown papules and/or plaques  - Benign-appearing - Discussed benign etiology and prognosis. - Observe - Call for any changes  Melanocytic Nevi - Tan-brown and/or pink-flesh-colored symmetric macules and papules - Benign appearing on exam today - Observation - Call clinic for new or changing moles - Recommend daily use of broad spectrum spf 30+ sunscreen to sun-exposed areas.   Hemangiomas - Red papules - Discussed benign nature - Observe - Call for  any changes  Actinic Damage - Chronic condition, secondary to cumulative UV/sun exposure - diffuse scaly erythematous macules with underlying dyspigmentation - Recommend daily broad spectrum sunscreen SPF 30+ to sun-exposed areas, reapply  every 2 hours as needed.  - Staying in the shade or wearing long sleeves, sun glasses (UVA+UVB protection) and wide brim hats (4-inch brim around the entire circumference of the hat) are also recommended for sun protection.  - Call for new or changing lesions.  History of Basal Cell Carcinoma of the Skin - No evidence of recurrence today on right forehead - Recommend regular full body skin exams - Recommend daily broad spectrum sunscreen SPF 30+ to sun-exposed areas, reapply every 2 hours as needed.  - Call if any new or changing lesions are noted between office visits  History of Melanoma in Situ - No evidence of recurrence today on right ear helix and right medial knee - Recommend regular full body skin exams - Recommend daily broad spectrum sunscreen SPF 30+ to sun-exposed areas, reapply every 2 hours as needed.  - Call if any new or changing lesions are noted between office visits  History of Melanoma - No evidence of recurrence today on right lower cheek  - No lymphadenopathy - Recommend regular full body skin exams - Recommend daily broad spectrum sunscreen SPF 30+ to sun-exposed areas, reapply every 2 hours as needed.  - Call if any new or changing lesions are noted between office visits   Skin cancer screening performed today.  Return in about 6 months (around 08/21/2021) for tbse history of melanoma .  I, Ruthell Rummage, CMA, am acting as scribe for Brendolyn Patty, MD.  Documentation: I have reviewed the above documentation for accuracy and completeness, and I agree with the above.  Brendolyn Patty MD

## 2021-02-19 NOTE — Patient Instructions (Addendum)
Gentle Skin Care Guide  1. Bathe no more than once a day.  2. Avoid bathing in hot water  3. Use a mild soap like Dove, Vanicream, Cetaphil, CeraVe. Can use Lever 2000 or Cetaphil antibacterial soap  4. Use soap only where you need it. On most days, use it under your arms, between your legs, and on your feet. Let the water rinse other areas unless visibly dirty.  5. When you get out of the bath/shower, use a towel to gently blot your skin dry, don't rub it.  6. While your skin is still a little damp, apply a moisturizing cream such as Vanicream, CeraVe, Cetaphil, Eucerin, Sarna lotion or plain Vaseline Jelly. For hands apply Neutrogena [place] Hand Cream or Excipial Hand Cream.  7. Reapply moisturizer any time you start to itch or feel dry.  8. Sometimes using free and clear laundry detergents can be helpful. Fabric softener sheets should be avoided. Downy Free & Gentle liquid, or any liquid fabric softener that is free of dyes and perfumes, it acceptable to use  9. If your doctor has given you prescription creams you may apply moisturizers over them   Melanoma ABCDEs  Melanoma is the most dangerous type of skin cancer, and is the leading cause of death from skin disease.  You are more likely to develop melanoma if you:  Have light-colored skin, light-colored eyes, or red or blond hair  Spend a lot of time in the sun  Tan regularly, either outdoors or in a tanning bed  Have had blistering sunburns, especially during childhood  Have a close family member who has had a melanoma  Have atypical moles or large birthmarks  Early detection of melanoma is key since treatment is typically straightforward and cure rates are extremely high if we catch it early.   The first sign of melanoma is often a change in a mole or a new dark spot.  The ABCDE system is a way of remembering the signs of melanoma.  A for asymmetry:  The two halves do not match. B for border:  The edges of the  growth are irregular. C for color:  A mixture of colors are present instead of an even brown color. D for diameter:  Melanomas are usually (but not always) greater than 70mm - the size of a pencil eraser. E for evolution:  The spot keeps changing in size, shape, and color.  Please check your skin once per month between visits. You can use a small mirror in front and a large mirror behind you to keep an eye on the back side or your body.   If you see any new or changing lesions before your next follow-up, please call to schedule a visit.  Please continue daily skin protection including broad spectrum sunscreen SPF 30+ to sun-exposed areas, reapplying every 2 hours as needed when you're outdoors.   Staying in the shade or wearing long sleeves, sun glasses (UVA+UVB protection) and wide brim hats (4-inch brim around the entire circumference of the hat) are also recommended for sun protection.   If you have any questions or concerns for your doctor, please call our main line at [phone] and press option 4 to reach your doctor's medical assistant. If no one answers, please leave a voicemail as directed and we will return your call as soon as possible. Messages left after 4 pm will be answered the following business day.   You may also send [place] a message via [place]. We  typically respond to MyChart messages within 1-2 business days.  For prescription refills, please ask your pharmacy to contact our office. Our fax number is [phone].  If you have an urgent issue when the clinic is closed that cannot wait until the next business day, you can page your doctor at the number below.    Please note that while we do our best to be available for urgent issues outside of office hours, we are not available 24/7.   If you have an urgent issue and are unable to reach [place], you may choose to seek medical care at your doctor's office, retail clinic, urgent care center, or emergency room.  If you have a  medical emergency, please immediately call 911 or go to the emergency department.  Pager Numbers  - Dr. Nehemiah Massed: [phone]  - Dr. Laurence Ferrari: [phone]  - Dr. Nicole Kindred: [phone]  In the event of inclement weather, please call our main line at [phone] for an update on the status of any delays or closures.  Dermatology Medication Tips: Please keep the boxes that topical medications come in in order to help keep track of the instructions about where and how to use these. Pharmacies typically print the medication instructions only on the boxes and not directly on the medication tubes.   If your medication is too expensive, please contact our office at [phone] option 4 or send [place] a message through [place].   We are unable to tell what your co-pay for medications will be in advance as this is different depending on your insurance coverage. However, we may be able to find a substitute medication at lower cost or fill out paperwork to get insurance to cover a needed medication.   If a prior authorization is required to get your medication covered by your insurance company, please allow [place] 1-2 business days to complete this process.  Drug prices often vary depending on where the prescription is filled and some pharmacies may offer cheaper prices.  The website www.goodrx.com contains coupons for medications through different pharmacies. The prices here do not account for what the cost may be with help from insurance (it may be cheaper with your insurance), but the website can give you the price if you did not use any insurance.  - You can print the associated coupon and take it with your prescription to the pharmacy.  - You may also stop by our office during regular business hours and pick up a GoodRx coupon card.  - If you need your prescription sent electronically to a different pharmacy, notify our office through [facility] or by phone at [phone] option 4.

## 2021-03-12 DIAGNOSIS — H353211 Exudative age-related macular degeneration, right eye, with active choroidal neovascularization: Secondary | ICD-10-CM | POA: Diagnosis not present

## 2021-04-18 ENCOUNTER — Other Ambulatory Visit: Payer: Self-pay | Admitting: Dermatology

## 2021-04-30 DIAGNOSIS — H40003 Preglaucoma, unspecified, bilateral: Secondary | ICD-10-CM | POA: Diagnosis not present

## 2021-04-30 DIAGNOSIS — H353122 Nonexudative age-related macular degeneration, left eye, intermediate dry stage: Secondary | ICD-10-CM | POA: Diagnosis not present

## 2021-07-03 ENCOUNTER — Ambulatory Visit: Payer: Medicare PPO

## 2021-07-06 DIAGNOSIS — R32 Unspecified urinary incontinence: Secondary | ICD-10-CM | POA: Diagnosis not present

## 2021-07-06 DIAGNOSIS — Z87891 Personal history of nicotine dependence: Secondary | ICD-10-CM | POA: Diagnosis not present

## 2021-07-06 DIAGNOSIS — L719 Rosacea, unspecified: Secondary | ICD-10-CM | POA: Diagnosis not present

## 2021-07-06 DIAGNOSIS — Z791 Long term (current) use of non-steroidal anti-inflammatories (NSAID): Secondary | ICD-10-CM | POA: Diagnosis not present

## 2021-07-06 DIAGNOSIS — R030 Elevated blood-pressure reading, without diagnosis of hypertension: Secondary | ICD-10-CM | POA: Diagnosis not present

## 2021-07-16 DIAGNOSIS — H353211 Exudative age-related macular degeneration, right eye, with active choroidal neovascularization: Secondary | ICD-10-CM | POA: Diagnosis not present

## 2021-07-19 ENCOUNTER — Other Ambulatory Visit: Payer: Self-pay | Admitting: Dermatology

## 2021-07-29 DIAGNOSIS — D0321 Melanoma in situ of right ear and external auricular canal: Secondary | ICD-10-CM | POA: Diagnosis not present

## 2021-08-21 ENCOUNTER — Ambulatory Visit (INDEPENDENT_AMBULATORY_CARE_PROVIDER_SITE_OTHER): Payer: Medicare PPO

## 2021-08-21 ENCOUNTER — Other Ambulatory Visit: Payer: Self-pay

## 2021-08-21 DIAGNOSIS — Z23 Encounter for immunization: Secondary | ICD-10-CM | POA: Diagnosis not present

## 2021-09-17 ENCOUNTER — Ambulatory Visit: Payer: Medicare PPO | Admitting: Dermatology

## 2021-09-17 ENCOUNTER — Other Ambulatory Visit: Payer: Self-pay

## 2021-09-17 DIAGNOSIS — L578 Other skin changes due to chronic exposure to nonionizing radiation: Secondary | ICD-10-CM | POA: Diagnosis not present

## 2021-09-17 DIAGNOSIS — Z1283 Encounter for screening for malignant neoplasm of skin: Secondary | ICD-10-CM | POA: Diagnosis not present

## 2021-09-17 DIAGNOSIS — L719 Rosacea, unspecified: Secondary | ICD-10-CM | POA: Diagnosis not present

## 2021-09-17 DIAGNOSIS — Z86006 Personal history of melanoma in-situ: Secondary | ICD-10-CM

## 2021-09-17 DIAGNOSIS — Z85828 Personal history of other malignant neoplasm of skin: Secondary | ICD-10-CM

## 2021-09-17 DIAGNOSIS — D1800 Hemangioma unspecified site: Secondary | ICD-10-CM

## 2021-09-17 DIAGNOSIS — D225 Melanocytic nevi of trunk: Secondary | ICD-10-CM

## 2021-09-17 DIAGNOSIS — L821 Other seborrheic keratosis: Secondary | ICD-10-CM

## 2021-09-17 DIAGNOSIS — L814 Other melanin hyperpigmentation: Secondary | ICD-10-CM

## 2021-09-17 DIAGNOSIS — D229 Melanocytic nevi, unspecified: Secondary | ICD-10-CM | POA: Diagnosis not present

## 2021-09-17 DIAGNOSIS — L300 Nummular dermatitis: Secondary | ICD-10-CM | POA: Diagnosis not present

## 2021-09-17 DIAGNOSIS — B353 Tinea pedis: Secondary | ICD-10-CM | POA: Diagnosis not present

## 2021-09-17 DIAGNOSIS — Z85820 Personal history of malignant melanoma of skin: Secondary | ICD-10-CM | POA: Diagnosis not present

## 2021-09-17 MED ORDER — CICLOPIROX OLAMINE 0.77 % EX CREA: TOPICAL_CREAM | Freq: Two times a day (BID) | CUTANEOUS | 1 refills | Status: DC

## 2021-09-17 NOTE — Progress Notes (Signed)
Follow-Up Visit   Subjective  Jon Weideman. is a 87 y.o. male who presents for the following: TBSE (Patient here for full body skin exam and skin cancer screening. Patient with hx of MM, Mmis and BCC. He is not aware of any new or changing spots. ). He uses metrogel for rosacea.  He also has persistent dry flaky feet.  He also has h/o nummular dermatitis for which he uses TMC cream prn.   The following portions of the chart were reviewed this encounter and updated as appropriate:       Review of Systems:  No other skin or systemic complaints except as noted in HPI or Assessment and Plan.  Objective  Well appearing patient in no apparent distress; mood and affect are within normal limits.  A full examination was performed including scalp, head, eyes, ears, nose, lips, neck, chest, axillae, abdomen, back, buttocks, bilateral upper extremities, bilateral lower extremities, hands, feet, fingers, toes, fingernails, and toenails. All findings within normal limits unless otherwise noted below.  Right Spinal Upper Back 4 mm brown macule  feet Scaling and mild 4th maceration web spaces and over distal and lateral soles.   right ankle Pink scaly patches at right ankle  face Mild erythema medial cheeks   Assessment & Plan  Nevus Right Spinal Upper Back  Benign-appearing.  Stable. Observation.  Call clinic for new or changing lesions.  Recommend daily use of broad spectrum spf 30+ sunscreen to sun-exposed areas.    Tinea pedis of both feet feet  Chronic involvement Start ciclopirox cream twice daily to feet and in between toes for 3-4 weeks as needed.  ciclopirox (LOPROX) 0.77 % cream - feet Apply topically 2 (two) times daily.  Nummular dermatitis right ankle  Chronic condition r/t dry skin  Continue TMC 0.1% cream 1-2 times daily as needed for rash at ankle. Avoid applying to face, groin, and axilla. Use as directed. Long-term use can cause thinning of the  skin.  Recommend mild soap and moisturizing cream 1-2 times daily.  Gentle skin care handout provided.    Topical steroids (such as triamcinolone, fluocinolone, fluocinonide, mometasone, clobetasol, halobetasol, betamethasone, hydrocortisone) can cause thinning and lightening of the skin if they are used for too long in the same area. Your physician has selected the right strength medicine for your problem and area affected on the body. Please use your medication only as directed by your physician to prevent side effects.    Rosacea face  Controlled Rosacea is a chronic progressive skin condition usually affecting the face of adults, causing redness and/or acne bumps. It is treatable but not curable. It sometimes affects the eyes (ocular rosacea) as well. It may respond to topical and/or systemic medication and can flare with stress, sun exposure, alcohol, exercise and some foods.  Daily application of broad spectrum spf 30+ sunscreen to face is recommended to reduce flares.  Continue Metronidazole 0.75% Gel qd to face  Lentigines - Scattered tan macules - Due to sun exposure - Benign-appearing, observe - Recommend daily broad spectrum sunscreen SPF 30+ to sun-exposed areas, reapply every 2 hours as needed. - Call for any changes  Seborrheic Keratoses - Stuck-on, waxy, tan-brown papules and/or plaques  - Benign-appearing - Discussed benign etiology and prognosis. - Observe - Call for any changes  Melanocytic Nevi - Tan-brown and/or pink-flesh-colored symmetric macules and papules - Benign appearing on exam today - Observation - Call clinic for new or changing moles - Recommend daily use of broad  spectrum spf 30+ sunscreen to sun-exposed areas.   Hemangiomas - Red papules - Discussed benign nature - Observe - Call for any changes  Actinic Damage - Chronic condition, secondary to cumulative UV/sun exposure - diffuse scaly erythematous macules with underlying  dyspigmentation - Recommend daily broad spectrum sunscreen SPF 30+ to sun-exposed areas, reapply every 2 hours as needed.  - Staying in the shade or wearing long sleeves, sun glasses (UVA+UVB protection) and wide brim hats (4-inch brim around the entire circumference of the hat) are also recommended for sun protection.  - Call for new or changing lesions.  Skin cancer screening performed today.  History of Basal Cell Carcinoma of the Skin 2019 R forehead - No evidence of recurrence today - Recommend regular full body skin exams - Recommend daily broad spectrum sunscreen SPF 30+ to sun-exposed areas, reapply every 2 hours as needed.  - Call if any new or changing lesions are noted between office visits  History of Melanoma 2021 0.70mm Level II R cheek - No evidence of recurrence today - Recommend regular full body skin exams - Recommend daily broad spectrum sunscreen SPF 30+ to sun-exposed areas, reapply every 2 hours as needed.  - Call if any new or changing lesions are noted between office visits  History of Melanoma in Situ 2021 R ear helix, 2019 R med knee - No evidence of recurrence today - Recommend regular full body skin exams - Recommend daily broad spectrum sunscreen SPF 30+ to sun-exposed areas, reapply every 2 hours as needed.  - Call if any new or changing lesions are noted between office visits  Return in about 6 months (around 03/17/2022) for TBSE.  Graciella Belton, RMA, am acting as scribe for Brendolyn Patty, MD .  Documentation: I have reviewed the above documentation for accuracy and completeness, and I agree with the above.  Brendolyn Patty MD

## 2021-09-17 NOTE — Patient Instructions (Addendum)
Continue triamcinolone 0.1% cream 1-2 times daily as needed for rash at ankle, waistband. Avoid applying to creases, face, groin, and axilla. Use as directed. Long-term use can cause thinning of the skin.  Topical steroids (such as triamcinolone, fluocinolone, fluocinonide, mometasone, clobetasol, halobetasol, betamethasone, hydrocortisone) can cause thinning and lightening of the skin if they are used for too long in the same area. Your physician has selected the right strength medicine for your problem and area affected on the body. Please use your medication only as directed by your physician to prevent side effects.   Start ciclopirox cream twice daily to feet and in between toes for 3-4 weeks as needed.   Melanoma ABCDEs  Melanoma is the most dangerous type of skin cancer, and is the leading cause of death from skin disease.  You are more likely to develop melanoma if you: Have light-colored skin, light-colored eyes, or red or blond hair Spend a lot of time in the sun Tan regularly, either outdoors or in a tanning bed Have had blistering sunburns, especially during childhood Have a close family member who has had a melanoma Have atypical moles or large birthmarks  Early detection of melanoma is key since treatment is typically straightforward and cure rates are extremely high if we catch it early.   The first sign of melanoma is often a change in a mole or a new dark spot.  The ABCDE system is a way of remembering the signs of melanoma.  A for asymmetry:  The two halves do not match. B for border:  The edges of the growth are irregular. C for color:  A mixture of colors are present instead of an even brown color. D for diameter:  Melanomas are usually (but not always) greater than 56mm - the size of a pencil eraser. E for evolution:  The spot keeps changing in size, shape, and color.  Please check your skin once per month between visits. You can use a small mirror in front and a large  mirror behind you to keep an eye on the back side or your body.   If you see any new or changing lesions before your next follow-up, please call to schedule a visit.  Please continue daily skin protection including broad spectrum sunscreen SPF 30+ to sun-exposed areas, reapplying every 2 hours as needed when you're outdoors.    If you have any questions or concerns for your doctor, please call our main line at [phone] and press option 4 to reach your doctor's medical assistant. If no one answers, please leave a voicemail as directed and we will return your call as soon as possible. Messages left after 4 pm will be answered the following business day.   You may also send [place] a message via [place]. We typically respond to MyChart messages within 1-2 business days.  For prescription refills, please ask your pharmacy to contact our office. Our fax number is [phone].  If you have an urgent issue when the clinic is closed that cannot wait until the next business day, you can page your doctor at the number below.    Please note that while we do our best to be available for urgent issues outside of office hours, we are not available 24/7.   If you have an urgent issue and are unable to reach [place], you may choose to seek medical care at your doctor's office, retail clinic, urgent care center, or emergency room.  If you have a medical emergency, please  immediately call 911 or go to the emergency department.  Pager Numbers  - Dr. Nehemiah Massed: [phone]  - Dr. Laurence Ferrari: [phone]  - Dr. Nicole Kindred: [phone]  In the event of inclement weather, please call our main line at [phone] for an update on the status of any delays or closures.  Dermatology Medication Tips: Please keep the boxes that topical medications come in in order to help keep track of the instructions about where and how to use these. Pharmacies typically print the medication instructions only on the boxes and not directly  on the medication tubes.   If your medication is too expensive, please contact our office at [phone] option 4 or send [place] a message through [place].   We are unable to tell what your co-pay for medications will be in advance as this is different depending on your insurance coverage. However, we may be able to find a substitute medication at lower cost or fill out paperwork to get insurance to cover a needed medication.   If a prior authorization is required to get your medication covered by your insurance company, please allow [place] 1-2 business days to complete this process.  Drug prices often vary depending on where the prescription is filled and some pharmacies may offer cheaper prices.  The website www.goodrx.com contains coupons for medications through different pharmacies. The prices here do not account for what the cost may be with help from insurance (it may be cheaper with your insurance), but the website can give you the price if you did not use any insurance.  - You can print the associated coupon and take it with your prescription to the pharmacy.  - You may also stop by our office during regular business hours and pick up a GoodRx coupon card.  - If you need your prescription sent electronically to a different pharmacy, notify our office through [facility] or by phone at [phone] option 4.

## 2021-10-24 DIAGNOSIS — H353211 Exudative age-related macular degeneration, right eye, with active choroidal neovascularization: Secondary | ICD-10-CM | POA: Diagnosis not present

## 2021-10-24 DIAGNOSIS — Z0100 Encounter for examination of eyes and vision without abnormal findings: Secondary | ICD-10-CM | POA: Diagnosis not present

## 2021-11-20 ENCOUNTER — Telehealth: Payer: Self-pay | Admitting: Internal Medicine

## 2021-11-25 NOTE — Progress Notes (Signed)
Acute Office Visit  Subjective:    Patient ID: Jon Peeples., male    DOB: [birth year], 88 y.o.   MRN: [id number]  Chief Complaint  Patient presents with   Gastroesophageal Reflux   Knee Pain    HPI Patient is in today for scheduled appointment for knee pain left - he reports the pain was posterior knee pain he felt with getting out of bed at his daughter. He was out of town visiting her. He felt his upper leg bone was hurting. He rode to [place] by train.  He sees orthopedics for his knees.  He took ibuprofen and his knee pain resolved.  Denies any swelling, warmth or erythema.Denies any injury.   Onset Monday night January 2nd and by 5th felt much better. January 6th felt completely normal.  Denies any calf pain, shortness of breath, or cough.   He reports he went to a restaurant in [place] and after dinner had some indigestion and had  mild vomiting threw up small pieces of food a few hours after dinner. Crab cakes were very brown and overcooked and feels like they made him sick. He traveled again at home by Thursday. Denies any blood or hemoptysis. Denies any cough. No vomiting since then.  Denies any known trauma or injury Last colonoscopy was 06/02/2013.had 4 tubular adenomas was to follow up in 3 years. Prefers  not to have them.  Has no symptoms today.  Remote history of melanoma.  Patient  denies any fever, body aches,chills, rash, chest pain, shortness of breath, nausea, vomiting, or diarrhea.  Denies dizziness, lightheadedness, pre syncopal or syncopal episodes.    Past Medical History:  Diagnosis Date   Basal cell carcinoma 06/29/2018   right forehead   Cancer ([facility]) 1965   melanoma, LNs resected   COPD (chronic obstructive pulmonary disease) ([facility])    Infectious colitis 2000   requiring hospitalization   Influenza 02/18/2018   Melanoma ([facility]) 1964   right abdomen   Melanoma in situ ([facility]) 10/09/2020   right ear helix, MOHs 12/10/20 with Dr. Lacinda Axon   Melanoma in  situ of right lower extremity ([facility]) 07/05/2018   right medial knee. Excised, margins free.   Melanoma of face ([facility]) 08/20/2020   right lower cheek 0.17mm, [place] Level II, Mohs with Dr. Lacinda Axon 09/28/20   Rosacea    Vertigo    1 episode (2019)    Past Surgical History:  Procedure Laterality Date   CATARACT EXTRACTION W/PHACO Right 06/29/2019   Procedure: CATARACT EXTRACTION PHACO AND INTRAOCULAR LENS PLACEMENT ([place]) RIGHT;  Surgeon: Leandrew Koyanagi, MD;  Location: [facility];  Service: Ophthalmology;  Laterality: Right;   CATARACT EXTRACTION W/PHACO Left 08/31/2019   Procedure: CATARACT EXTRACTION PHACO AND INTRAOCULAR LENS PLACEMENT ([facility]) LEFT 02:06.7  25.2%  32.01;  Surgeon: Leandrew Koyanagi, MD;  Location: [facility];  Service: Ophthalmology;  Laterality: Left;   MELANOMA EXCISION WITH SENTINEL LYMPH NODE BIOPSY     SOFT TISSUE TUMOR RESECTION  1985    Family History  Problem Relation Age of Onset   Mental illness Mother    Heart failure Mother    Heart failure Father    Rheumatic fever Sister    Cancer Neg Hx    Drug abuse Neg Hx     Social History   Socioeconomic History   Marital status: Married    Spouse name: Not on file   Number of children: Not on file   Years of education: Not on file  Highest education level: Not on file  Occupational History   Not on file  Tobacco Use   Smoking status: Former    Packs/day: 1.00    Years: 10.00    Pack years: 10.00    Types: Cigarettes    Quit date: 02/22/1962    Years since quitting: 59.8   Smokeless tobacco: Never  Vaping Use   Vaping Use: Never used  Substance and Sexual Activity   Alcohol use: Yes    Alcohol/week: 7.0 standard drinks    Types: 7 Standard drinks or equivalent per week    Comment: Occasional wine and beer with meal    Drug use: No   Sexual activity: Never  Other Topics Concern   Not on file  Social History Narrative   Married    Lives in [place] >10 years as of 02/2018     Social Determinants of Health   Financial Resource Strain: Not on file  Food Insecurity: Not on file  Transportation Needs: Not on file  Physical Activity: Not on file  Stress: Not on file  Social Connections: Not on file  Intimate Partner Violence: Not on file    Outpatient Medications Prior to Visit  Medication Sig Dispense Refill   ciclopirox (LOPROX) 0.77 % cream Apply topically 2 (two) times daily. 30 g 1   ergocalciferol (DRISDOL) 1.25 MG (50000 UT) capsule Take 1 capsule (50,000 Units total) by mouth once a week. 12 capsule 0   metroNIDAZOLE (METROGEL) 0.75 % gel APPLY A SMALL AMOUNT TO AFFECTED AREA EVERY MORNING 45 g 2   Multiple Vitamins-Minerals (PRESERVISION AREDS 2) CAPS Take 1 capsule by mouth 2 (two) times daily.     No facility-administered medications prior to visit.    Allergies  Allergen Reactions   Demerol     Caused patient to feel nauseated   Meperidine     Other reaction(s): Unknown    Review of Systems  Constitutional: Negative.   HENT: Negative.    Respiratory: Negative.    Cardiovascular: Negative.   Gastrointestinal:  Positive for vomiting (one episode see HPI none now.). Negative for abdominal distention, abdominal pain, anal bleeding, blood in stool, constipation, diarrhea, nausea and rectal pain.  Genitourinary: Negative.   Musculoskeletal:  Positive for arthralgias (chronic knee pain.). Negative for back pain, gait problem, joint swelling, myalgias, neck pain and neck stiffness.  Skin: Negative.   Hematological: Negative.   Psychiatric/Behavioral: Negative.        Objective:    Physical Exam Constitutional:      Appearance: Normal appearance. He is not ill-appearing or diaphoretic.  HENT:     Head: Normocephalic and atraumatic.     Right Ear: External ear normal.     Left Ear: External ear normal.     Mouth/Throat:     Mouth: Mucous membranes are moist.     Pharynx: No oropharyngeal exudate or posterior oropharyngeal erythema.   Eyes:     Conjunctiva/sclera: Conjunctivae normal.     Pupils: Pupils are equal, round, and reactive to light.  Cardiovascular:     Rate and Rhythm: Normal rate and regular rhythm.     Pulses: Normal pulses.     Heart sounds: Normal heart sounds.  Pulmonary:     Effort: Pulmonary effort is normal.     Breath sounds: Normal breath sounds.  Abdominal:     General: There is no distension.     Palpations: Abdomen is soft. There is no mass.     Tenderness:  There is no abdominal tenderness. There is no right CVA tenderness, left CVA tenderness, guarding or rebound.     Hernia: No hernia is present.  Musculoskeletal:        General: No swelling, tenderness, deformity or signs of injury. Normal range of motion.     Cervical back: Normal range of motion and neck supple.     Right hip: Normal.     Left hip: Normal.     Right upper leg: Normal.     Left upper leg: Normal.     Right knee: Normal.     Left knee: Normal.     Right lower leg: Normal. No edema.     Left lower leg: Normal. No edema.     Right ankle: Normal.     Right Achilles Tendon: Normal.     Left ankle: Normal.     Left Achilles Tendon: Normal.     Right foot: Normal.     Left foot: Normal.       Legs:     Comments: Left anterior leg pain with palpation firm of the bones, no lumps or growths notes. Knee exam within normal.  No effusion. No erythema, swelling or pain otherwise.   Skin:    General: Skin is warm.     Findings: No erythema.  Neurological:     General: No focal deficit present.     Mental Status: He is alert and oriented to person, place, and time. Mental status is at baseline.     Gait: Gait normal.  Psychiatric:        Mood and Affect: Mood normal.        Behavior: Behavior normal.        Thought Content: Thought content normal.        Judgment: Judgment normal.    BP 130/68    Pulse 83    Ht 5' 9.02" (1.753 m)    Wt 192 lb 6.4 oz (87.3 kg)    SpO2 99%    BMI 28.40 kg/m  Wt Readings from Last 3  Encounters:  11/26/21 192 lb 6.4 oz (87.3 kg)  08/27/20 194 lb (88 kg)  07/02/20 193 lb (87.5 kg)    Health Maintenance Due  Topic Date Due   Zoster Vaccines- Shingrix (2 of 2) 04/08/2018   COVID-19 Vaccine (3 - Moderna risk series) 01/27/2020    There are no preventive care reminders to display for this patient.   Lab Results  Component Value Date   TSH 4.04 08/27/2020   Lab Results  Component Value Date   WBC 5.4 08/27/2020   HGB 14.7 08/27/2020   HCT 43.7 08/27/2020   MCV 95.0 08/27/2020   PLT 225.0 08/27/2020   Lab Results  Component Value Date   NA 138 08/27/2020   K 4.6 08/27/2020   CO2 28 08/27/2020   GLUCOSE 96 08/27/2020   BUN 20 08/27/2020   CREATININE 1.21 08/27/2020   BILITOT 0.9 08/27/2020   ALKPHOS 59 08/27/2020   AST 13 08/27/2020   ALT 12 08/27/2020   PROT 6.3 08/27/2020   ALBUMIN 4.1 08/27/2020   CALCIUM 8.9 08/27/2020   GFR 52.26 (L) 08/27/2020   Lab Results  Component Value Date   CHOL 165 08/27/2020   Lab Results  Component Value Date   HDL 53.40 08/27/2020   Lab Results  Component Value Date   LDLCALC 83 08/27/2020   Lab Results  Component Value Date   TRIG 141.0 08/27/2020  Lab Results  Component Value Date   CHOLHDL 3 08/27/2020   No results found for: HGBA1C     Assessment & Plan:   Problem List Items Addressed This Visit       Other   Chronic pain of left knee - Primary    [facility] He did have some bone pain under knee with deep palpation anterior as on note 11/26/21 diagram.  Will x ray.  Follow treatment plan from office  if not improving or any worsening within 72 hours and also return to office or open medical facility at ANYTIME if any symptoms persist, change, or worsen or you have any further concerns or questions. Call 911 immediately for emergencies.         Relevant Orders   DG KNEE 3 VIEW LEFT   DG Tibia/Fibula Left   History of vomiting    One episode of vomiting after eating  crap cakes, no anaphylaxis symptoms, no angioedema, problems swallowing or swelling. He feels they did not agree with him no vomiting since this.  Will check fecal occult blood and CBC, CMET.  Suspect viral or related to food.       Relevant Orders   CBC with Differential/Platelet   Comprehensive metabolic panel   Fecal occult blood, imunochemical    Red Flags discussed. The patient was given clear instructions to go to ER or return to medical center if any red flags develop, symptoms do not improve, worsen or new problems develop. They verbalized understanding.  Return in about 2 weeks (around 12/10/2021), or if symptoms worsen or fail to improve, for at any time for any worsening symptoms, Go to Emergency room/ urgent care if worse.  No orders of the defined types were placed in this encounter.    Marcille Buffy, FNP

## 2021-11-26 ENCOUNTER — Ambulatory Visit (INDEPENDENT_AMBULATORY_CARE_PROVIDER_SITE_OTHER): Payer: Medicare PPO

## 2021-11-26 ENCOUNTER — Encounter: Payer: Self-pay | Admitting: Adult Health

## 2021-11-26 ENCOUNTER — Other Ambulatory Visit: Payer: Self-pay

## 2021-11-26 ENCOUNTER — Ambulatory Visit: Payer: Medicare PPO | Admitting: Adult Health

## 2021-11-26 VITALS — BP 130/68 | HR 83 | Ht 69.02 in | Wt 192.4 lb

## 2021-11-26 DIAGNOSIS — M25562 Pain in left knee: Secondary | ICD-10-CM

## 2021-11-26 DIAGNOSIS — M25462 Effusion, left knee: Secondary | ICD-10-CM | POA: Diagnosis not present

## 2021-11-26 DIAGNOSIS — Z87898 Personal history of other specified conditions: Secondary | ICD-10-CM | POA: Diagnosis not present

## 2021-11-26 DIAGNOSIS — G8929 Other chronic pain: Secondary | ICD-10-CM

## 2021-11-26 DIAGNOSIS — M85862 Other specified disorders of bone density and structure, left lower leg: Secondary | ICD-10-CM | POA: Diagnosis not present

## 2021-11-26 DIAGNOSIS — I8392 Asymptomatic varicose veins of left lower extremity: Secondary | ICD-10-CM | POA: Diagnosis not present

## 2021-11-26 NOTE — Patient Instructions (Signed)
Vomiting, Adult Vomiting is when stomach contents forcefully come out of the mouth. Many people notice nausea before vomiting. Vomiting can make you feel weak and cause you to become dehydrated. Dehydration can make you feel tired and thirsty, cause you to have a dry mouth, and decrease how often you urinate. Older adults and people who have other diseases or a weak body defense system (immune system) are at higher risk for dehydration. It is important to treat vomiting as told by your health care provider. Follow these instructions at home: Watch your symptoms for any changes. Tell your health care provider about them. Eating and drinking   Follow these recommendations as told by your health care provider: Take an oral rehydration solution (ORS). This is a drink that is sold at pharmacies and retail stores. Eat bland, easy-to-digest foods in small amounts as you are able. These foods include bananas, applesauce, rice, lean meats, toast, and crackers. Drink clear fluids slowly and in small amounts as you are able. Clear fluids include water, ice chips, low-calorie sports drinks, and fruit juice that has water added (diluted fruit juice). Avoid drinking fluids that contain a lot of sugar or caffeine, such as energy drinks, sports drinks, and soda. Avoid alcohol. Avoid spicy or fatty foods.  General instructions Wash your hands often using soap and water for at least 20 seconds. If soap and water are not available, use hand sanitizer. Make sure that everyone in your household washes their hands frequently. Take over-the-counter and prescription medicines only as told by your health care provider. Rest at home while you recover. Watch your condition for any changes. Keep all follow-up visits. This is important. Contact a health care provider if: Your vomiting gets worse. You have new symptoms. You have a fever. You cannot drink fluids without vomiting. You feel light-headed or dizzy. You  have a headache. You have muscle cramps. You have a rash. You have pain while urinating. Get help right away if: You have pain in your chest, neck, arm, or jaw. Your heart is beating very quickly. You have trouble breathing or you are breathing very quickly. You feel extremely weak or you faint. Your skin feels cold and clammy. You feel confused. You have persistent vomiting. You have vomit that is bright red or looks like black coffee grounds. You have stools (feces) that are bloody or black, or stools that look like tar. You have a severe headache, a stiff neck, or both. You have severe pain, cramping, or bloating in your abdomen. You have signs of dehydration, such as: Dark urine, very little urine, or no urine. Cracked lips. Dry mouth. Sunken eyes. Sleepiness. Weakness. These symptoms may be an emergency. Get help right away. Call 911. Do not wait to see if the symptoms will go away. Do not drive yourself to the hospital. Summary Vomiting is when stomach contents forcefully come out of the mouth. Vomiting can cause you to become dehydrated. It is important to treat vomiting as told by your health care provider. Follow your health care provider's instructions about eating and drinking. Wash your hands often using soap and water for at least 20 seconds. If soap and water are not available, use hand sanitizer. Watch your condition for any changes and for signs of dehydration. Keep all follow-up visits. This is important. This information is not intended to replace advice given to you by your health care provider. Make sure you discuss any questions you have with your health care provider. Document Revised: 05/10/2021  Document Reviewed: 05/10/2021 Elsevier Patient Education  2022 [place]. Acute Knee Pain, Adult Acute knee pain is sudden and may be caused by damage, swelling, or irritation of the muscles and tissues that support the knee. Pain may result from: A fall. An  injury to the knee from twisting motions. A hit to the knee. Infection. Acute knee pain may go away on its own with time and rest. If it does not, your health care provider may order tests to find the cause of the pain. These may include: Imaging tests, such as an X-ray, MRI, CT scan, or ultrasound. Joint aspiration. In this test, fluid is removed from the knee and evaluated. Arthroscopy. In this test, a lighted tube is inserted into the knee and an image is projected onto a TV screen. Biopsy. In this test, a sample of tissue is removed from the body and studied under a microscope. Follow these instructions at home: If you have a knee sleeve or brace:  Wear the knee sleeve or brace as told by your health care provider. Remove it only as told by your health care provider. Loosen it if your toes tingle, become numb, or turn cold and blue. Keep it clean. If the knee sleeve or brace is not waterproof: Do not let it get wet. Cover it with a watertight covering when you take a bath or shower. Activity Rest your knee. Do not do things that cause pain or make pain worse. Avoid high-impact activities or exercises, such as running, jumping rope, or doing jumping jacks. Work with a physical therapist to make a safe exercise program, as recommended by your health care provider. Do exercises as told by your physical therapist. Managing pain, stiffness, and swelling  If directed, put ice on the affected knee. To do this: If you have a removable knee sleeve or brace, remove it as told by your health care provider. Put ice in a plastic bag. Place a towel between your skin and the bag. Leave the ice on for 20 minutes, 2-3 times a day. Remove the ice if your skin turns bright red. This is very important. If you cannot feel pain, heat, or cold, you have a greater risk of damage to the area. If directed, use an elastic bandage to put pressure (compression) on your injured knee. This may control swelling,  give support, and help with discomfort. Raise (elevate) your knee above the level of your heart while you are sitting or lying down. Sleep with a pillow under your knee. General instructions Take over-the-counter and prescription medicines only as told by your health care provider. Do not use any products that contain nicotine or tobacco, such as cigarettes, e-cigarettes, and chewing tobacco. If you need help quitting, ask your health care provider. If you are overweight, work with your health care provider and a dietitian to set a weight-loss goal that is healthy and reasonable for you. Extra weight can put pressure on your knee. Pay attention to any changes in your symptoms. Keep all follow-up visits. This is important. Contact a health care provider if: Your knee pain continues, changes, or gets worse. You have a fever along with knee pain. Your knee feels warm to the touch or is red. Your knee buckles or locks up. Get help right away if: Your knee swells, and the swelling becomes worse. You cannot move your knee. You have severe pain in your knee that cannot be managed with pain medicine. Summary Acute knee pain can be caused by  a fall, an injury, an infection, or damage, swelling, or irritation of the tissues that support your knee. Your health care provider may perform tests to find out the cause of the pain. Pay attention to any changes in your symptoms. Relieve your pain with rest, medicines, light activity, and the use of ice. Get help right away if your knee swells, you cannot move your knee, or you have severe pain that cannot be managed with medicine. This information is not intended to replace advice given to you by your health care provider. Make sure you discuss any questions you have with your health care provider. Document Revised: 04/18/2020 Document Reviewed: 04/18/2020 Elsevier Patient Education  2022 [employer].

## 2021-11-26 NOTE — Assessment & Plan Note (Signed)
One episode of vomiting after eating crap cakes, no anaphylaxis symptoms, no angioedema, problems swallowing or swelling. He feels they did not agree with him no vomiting since this.  Will check fecal occult blood and CBC, CMET.  Suspect viral or related to food.

## 2021-11-26 NOTE — Assessment & Plan Note (Signed)
[facility]. He did have some bone pain under knee with deep palpation anterior as on note 11/26/21 diagram.  Will x ray.  Follow treatment plan from office  if not improving or any worsening within 72 hours and also return to office or open medical facility at ANYTIME if any symptoms persist, change, or worsen or you have any further concerns or questions. Call 911 immediately for emergencies.

## 2021-11-27 ENCOUNTER — Other Ambulatory Visit: Payer: Self-pay | Admitting: Adult Health

## 2021-11-27 DIAGNOSIS — M25562 Pain in left knee: Secondary | ICD-10-CM

## 2021-11-27 DIAGNOSIS — M79605 Pain in left leg: Secondary | ICD-10-CM

## 2021-11-27 DIAGNOSIS — G8929 Other chronic pain: Secondary | ICD-10-CM

## 2021-11-27 LAB — CBC WITH DIFFERENTIAL/PLATELET
Basophils Relative: 0 % (ref 0.0–3.0)
Eosinophils Relative: 8 % — ABNORMAL HIGH (ref 0.0–5.0)
HCT: 38.4 % — ABNORMAL LOW (ref 39.0–52.0)
Hemoglobin: 12.8 g/dL — ABNORMAL LOW (ref 13.0–17.0)
Lymphocytes Relative: 27 % (ref 12.0–46.0)
MCHC: 33.4 g/dL (ref 30.0–36.0)
MCV: 94.3 fl (ref 78.0–100.0)
Monocytes Relative: 5 % (ref 3.0–12.0)
Neutrophils Relative %: 60 % (ref 43.0–77.0)
Platelets: 272 10*3/uL (ref 150.0–400.0)
RBC: 4.07 Mil/uL — ABNORMAL LOW (ref 4.22–5.81)
RDW: 13.2 % (ref 11.5–15.5)
WBC: 6.6 10*3/uL (ref 4.0–10.5)

## 2021-11-27 LAB — COMPREHENSIVE METABOLIC PANEL
ALT: 15 U/L (ref 0–53)
AST: 13 U/L (ref 0–37)
Albumin: 3.9 g/dL (ref 3.5–5.2)
Alkaline Phosphatase: 58 U/L (ref 39–117)
BUN: 22 mg/dL (ref 6–23)
CO2: 30 mEq/L (ref 19–32)
Calcium: 8.6 mg/dL (ref 8.4–10.5)
Chloride: 105 mEq/L (ref 96–112)
Creatinine, Ser: 1.23 mg/dL (ref 0.40–1.50)
GFR: 51.29 mL/min — ABNORMAL LOW (ref >60.00)
Glucose, Bld: 106 mg/dL — ABNORMAL HIGH (ref 70–99)
Potassium: 4.1 mEq/L (ref 3.5–5.1)
Sodium: 141 mEq/L (ref 135–145)
Total Bilirubin: 0.4 mg/dL (ref 0.2–1.2)
Total Protein: 6 g/dL (ref 6.0–8.3)

## 2021-11-27 NOTE — Progress Notes (Signed)
° °  Osteopenia left knee with small effusion. Maintain adequate vitamin d and calcium intake.   Osteopenia seen on x ray of leg, does show small amount of fluid ( effusion ) on knee.  Would advise he have a follow up with the orthopedic he is seeing.  I will order an ultrasound on his leg to be sure no DVT/clot he will get a call for this.  Follow treatment plan from office if not improving or any worsening within 72 hours and also return to office or open medical facility at ANYTIME if any symptoms persist, change, or worsen or you have any further concerns or questions. Call 911 immediately for emergencies.

## 2021-11-27 NOTE — Progress Notes (Signed)
Osteopenia seen on x ray of leg, does show small amount of fluid ( effusion ) on knee.  Would advise he have a follow up with  the orthopedic he is seeing.  I will order an ultrasound on his leg to be sure no DVT/clot he will get a call for this.  Follow treatment plan from office  if not improving or any worsening within 72 hours and also return to office or open medical facility at ANYTIME if any symptoms persist, change, or worsen or you have any further concerns or questions. Call 911 immediately for emergencies.

## 2021-11-27 NOTE — Progress Notes (Signed)
Orders Placed This Encounter  Procedures   US Venous Img Lower Unilateral Left (DVT)    Order Specific Question:   Reason for Exam (SYMPTOM  OR DIAGNOSIS REQUIRED)    Answer:   left anterior leg/ knee pain out of no where for 3 days, no known injury,varicosities lower leg rule out DVT.    Order Specific Question:   Preferred imaging location?    Answer:   [facility]

## 2021-11-27 NOTE — Progress Notes (Signed)
Osteopenia left knee with small effusion. Maintain adequate vitamin d and calcium intake.

## 2021-11-29 NOTE — Progress Notes (Signed)
Hemoglobin and hematocrit is slightly low and decreased from previous, fecal blood test is still pending ,please add on or have patient come back for IBC ferritin and iron labs. Does he have gastroenterologist would advise work up for possible endoscopy/ colonoscopy. Eosinophils mild elevation can come from allergies, will Recheck CBC in 1 month advised. Let me know if gi referral needed.

## 2021-12-02 ENCOUNTER — Telehealth: Payer: Self-pay

## 2021-12-02 ENCOUNTER — Other Ambulatory Visit: Payer: Self-pay

## 2021-12-02 ENCOUNTER — Other Ambulatory Visit (INDEPENDENT_AMBULATORY_CARE_PROVIDER_SITE_OTHER): Payer: Medicare PPO

## 2021-12-02 DIAGNOSIS — D649 Anemia, unspecified: Secondary | ICD-10-CM

## 2021-12-02 NOTE — Telephone Encounter (Signed)
Labs ordered for IBC+Ferritin for future labs

## 2021-12-02 NOTE — Progress Notes (Signed)
Noted patient declined recommended GI work up.

## 2021-12-03 LAB — IBC + FERRITIN
Ferritin: 69.3 ng/mL (ref 22.0–322.0)
Iron: 65 ug/dL (ref 42–165)
Saturation Ratios: 23.3 % (ref 20.0–50.0)
TIBC: 278.6 ug/dL (ref 250.0–450.0)
Transferrin: 199 mg/dL — ABNORMAL LOW (ref 212.0–360.0)

## 2021-12-06 ENCOUNTER — Other Ambulatory Visit (INDEPENDENT_AMBULATORY_CARE_PROVIDER_SITE_OTHER): Payer: Medicare PPO

## 2021-12-06 DIAGNOSIS — Z87898 Personal history of other specified conditions: Secondary | ICD-10-CM

## 2021-12-06 LAB — FECAL OCCULT BLOOD, IMMUNOCHEMICAL: Fecal Occult Bld: NEGATIVE

## 2021-12-06 NOTE — Progress Notes (Signed)
Negative fecal occult blood.

## 2021-12-09 ENCOUNTER — Ambulatory Visit
Admission: RE | Admit: 2021-12-09 | Discharge: 2021-12-09 | Disposition: A | Discharge status: Home / Self Care | Payer: Medicare PPO | Source: Ambulatory Visit | Attending: Adult Health | Admitting: Adult Health

## 2021-12-09 ENCOUNTER — Other Ambulatory Visit: Payer: Self-pay

## 2021-12-09 DIAGNOSIS — M79662 Pain in left lower leg: Secondary | ICD-10-CM | POA: Diagnosis not present

## 2021-12-09 DIAGNOSIS — G8929 Other chronic pain: Secondary | ICD-10-CM | POA: Insufficient documentation

## 2021-12-09 DIAGNOSIS — M79605 Pain in left leg: Secondary | ICD-10-CM | POA: Diagnosis not present

## 2021-12-09 DIAGNOSIS — M25562 Pain in left knee: Secondary | ICD-10-CM | POA: Diagnosis not present

## 2021-12-09 NOTE — Progress Notes (Signed)
No DVT left lower extremity Follow treatment plan from office  if not improving or any worsening within 72 hours and also return to office or open medical facility at ANYTIME if any symptoms persist, change, or worsen or you have any further concerns or questions. Call 911 immediately for emergencies.

## 2021-12-13 ENCOUNTER — Other Ambulatory Visit: Payer: Self-pay | Admitting: Dermatology

## 2021-12-24 DIAGNOSIS — H353211 Exudative age-related macular degeneration, right eye, with active choroidal neovascularization: Secondary | ICD-10-CM | POA: Diagnosis not present

## 2022-03-18 ENCOUNTER — Encounter: Payer: Medicare PPO | Admitting: Dermatology

## 2022-04-10 ENCOUNTER — Ambulatory Visit (INDEPENDENT_AMBULATORY_CARE_PROVIDER_SITE_OTHER): Payer: Medicare PPO

## 2022-04-10 VITALS — Ht 69.0 in | Wt 192.0 lb

## 2022-04-10 DIAGNOSIS — Z0000 Encounter for general adult medical examination without abnormal findings: Secondary | ICD-10-CM

## 2022-04-10 NOTE — Progress Notes (Addendum)
Subjective:   Jon Tavella. is a 88 y.o. male who presents for Medicare Annual/Subsequent preventive examination.  Review of Systems    No ROS.  Medicare Wellness Virtual Visit.  Visual/audio telehealth visit, UTA vital signs.   See social history for additional risk factors.   Cardiac Risk Factors include: advanced age (>10mn, >>44women);male gender     Objective:    Today's Vitals   04/10/22 1136  Weight: 192 lb (87.1 kg)  Height: '5\' 9"'$  (1.753 m)   Body mass index is 28.35 kg/m.     04/10/2022   11:55 AM 07/02/2020   12:47 PM 06/29/2019   12:10 PM 06/29/2019    9:54 AM 03/19/2018   11:39 AM 03/18/2017   11:28 AM 03/05/2016    2:52 PM  Advanced Directives  Does Patient Have a Medical Advance Directive? Yes Yes Yes Yes Yes Yes Yes  Type of AParamedicof AMorvenLiving will HFrankfortLiving will H[place]ondaleLiving will Healthcare Power of ASandy PointLiving will HDeep RiverLiving will HCantonLiving will  Does patient want to make changes to medical advance directive? No - Patient declined No - Patient declined No - Patient declined No - Patient declined No - Patient declined No - Patient declined   Copy of HArnolds Parkin Chart? No - copy requested No - copy requested No - copy requested No - copy requested No - copy requested No - copy requested No - copy requested    Current Medications (verified) Outpatient Encounter Medications as of 04/10/2022  Medication Sig   ciclopirox (LOPROX) 0.77 % cream Apply topically 2 (two) times daily.   ergocalciferol (DRISDOL) 1.25 MG (50000 UT) capsule Take 1 capsule (50,000 Units total) by mouth once a week. (Patient not taking: Reported on 04/10/2022)   metroNIDAZOLE (METROGEL) 0.75 % gel APPLY A SMALL AMOUNT TO AFFECTED AREA EVERY MORNING   Multiple Vitamins-Minerals (PRESERVISION AREDS 2) CAPS  Take 1 capsule by mouth 2 (two) times daily.   No facility-administered encounter medications on file as of 04/10/2022.    Allergies (verified) Demerol and Meperidine   History: Past Medical History:  Diagnosis Date   Basal cell carcinoma 06/29/2018   right forehead   [place]   [place]anoma, LNs resected   COPD (chronic obstructive pulmonary disease) ([place]    Infectious colitis 2000   requiring hospitalization   Influenza 02/18/2018   [place] [place]   right abdomen   [place]2020   right ear helix, MOHs 12/10/20 with Dr. CLacinda Axon  [place] in situ of right [place] extremity ([place] 07/05/2018   right medial knee. Excised, margins free.   Melanoma of face ([facility] 08/20/2020   right lower cheek 0.385m [place] Level II, Mohs with Dr. CoLacinda Axon1/12/21   Rosacea    Vertigo    1 episode (2019)   Past Surgical History:  Procedure Laterality Date   CATARACT EXTRACTION W/PHACO Right 06/29/2019   Procedure: CATARACT EXTRACTION PHACO AND INTRAOCULAR LENS PLACEMENT (I[place]RIGHT;  Surgeon: BrLeandrew KoyanagiMD;  [facility];  Laterality: Right;   CATARACT EXTRACTION W/PHACO Left 08/31/2019   Procedure: CATARACT EXTRACTION PHACO AND INTRAOCULAR LENS PLACEMENT (IOC) LEFT 02:06.7  25.2%  32.01;  Surgeon: BrLeandrew KoyanagiMD;  [facility];  Laterality: Left;   MELANOMA EXCISION WITH SENTINEL LYMPH NODE BIOPSY  SOFT TISSUE TUMOR RESECTION  1985   Family History  Problem Relation Age of Onset   Mental illness Mother    Heart failure Mother    Heart failure Father    Rheumatic fever Sister    Cancer Neg Hx    Drug abuse Neg Hx    Social History   Socioeconomic History   Marital status: Married    Spouse name: Not on file   Number of children: Not on file   Years of education: Not on file   Highest education level: Not on file  Occupational History   Not on file   Tobacco Use   Smoking status: Former    Packs/day: 1.00    Years: 10.00    Pack years: 10.00    Types: Cigarettes    Quit date: 02/22/1962    Years since quitting: 60.1   Smokeless tobacco: Never  Vaping Use   Vaping Use: Never used  Substance and Sexual Activity   Alcohol use: Yes    Alcohol/week: 7.0 standard drinks    Types: 7 Standard drinks or equivalent per week    Comment: Occasional wine and beer with meal    Drug use: No   Sexual activity: Never  Other Topics Concern   Not on file  Social History Narrative   Married    Lives in [place] >10 years as of 02/2018    Social Determinants of Health   Financial Resource Strain: Low Risk    Difficulty of Paying Living Expenses: Not hard at all  Food Insecurity: No Food Insecurity   Worried About Charity fundraiser in the Last Year: Never true   Arboriculturist in the Last Year: Never true  Transportation Needs: No Transportation Needs   Lack of Transportation (Medical): No   Lack of Transportation (Non-Medical): No  Physical Activity: Not on file  Stress: No Stress Concern Present   Feeling of Stress : Not at all  Social Connections: Unknown   Frequency of Communication with Friends and Family: Twice a week   Frequency of Social Gatherings with Friends and Family: Twice a week   Attends Religious Services: Not on Electrical engineer or Organizations: Not on file   Attends Archivist Meetings: Not on file   Marital Status: Married    Tobacco Counseling Counseling given: Not Answered   Clinical Intake: Pre-visit preparation completed: Yes        Diabetes: No  How often do you need to have someone help you when you read instructions, pamphlets, or other written materials from your doctor or pharmacy?: 1 - Never  Interpreter Needed?: No    Activities of Daily Living    04/10/2022   11:41 AM  In your present state of health, do you have any difficulty performing the following  activities:  Hearing? 0  Vision? 0  Difficulty concentrating or making decisions? 0  Walking or climbing stairs? 0  Dressing or bathing? 0  Doing errands, shopping? 0  Preparing Food and eating ? N  Using the Toilet? N  In the past six months, have you accidently leaked urine? N  Do you have problems with loss of bowel control? N  Managing your Medications? N  Managing your Finances? N  Housekeeping or managing your Housekeeping? [place] assist    Patient Care Team: Crecencio Mc, MD as PCP - General (Internal Medicine) Crecencio Mc, MD (Internal Medicine)  Indicate any recent Medical Services you may have received from other than [facility] providers in the past year (date may be approximate).     Assessment:   This is a routine wellness examination for [place].  Virtual Visit via Telephone Note  I connected with  Jon Sherman. on 04/10/22 at 11:30 AM EDT by telephone and verified that I am speaking with the correct person using two identifiers.  Persons participating in the virtual visit: patient/Nurse Health Advisor   I discussed the limitations of performing an evaluation and management service by telehealth. We continued and completed visit with audio only.Some vital signs may be absent or patient reported.   Hearing/Vision screen Hearing Screening - Comments:: Patient is able to hear conversational tones without difficulty. No issues reported. Vision Screening - Comments:: Follow[facility] Wears corrective lenses  Macular degeneration; injections as needed They have seen their ophthalmologist   Dietary issues and exercise activities discussed: Current Exercise Habits: Home exercise routine, Intensity: Mild   Goals Addressed               This Visit's Progress     Patient Stated     Maintain Healthy Lifestyle (pt-stated)        Stay active. Stay hydrated.       Depression Screen    04/10/2022   11:39 AM 11/26/2021    1:52  PM 07/02/2020   12:44 PM 06/29/2019    9:48 AM 09/15/2018    1:41 PM 03/19/2018   11:29 AM 03/18/2017   11:17 AM  PHQ 2/9 Scores  PHQ - 2 Score 0 0 0 0 0 0 0  PHQ- 9 Score       0    Fall Risk    04/10/2022   11:41 AM 11/26/2021    1:51 PM 08/27/2020    8:12 AM 07/02/2020   12:49 PM 08/26/2019    3:11 PM  [place] in the past year? 0 0 0 0 0  Number falls in past yr: 0 0  0   Injury with Fall?  0     Follow up Falls evaluation completed Falls evaluation completed Falls evaluation completed Falls evaluation completed Falls evaluation completed    [place]: Home free of loose throw rugs in walkways, pet beds, electrical cords, etc? Yes  Adequate lighting in your home to reduce risk of falls? Yes   ASSISTIVE DEVICES UTILIZED TO PREVENT FALLS: Life alert? No  Use of a cane, walker or w/c? No  Grab bars in the bathroom? Yes   TIMED UP AND GO: Was the test performed? No .   Cognitive Function: Patient is alert and oriented x3.     03/05/2016    2:57 PM  MMSE - Mini Mental State Exam  Orientation to time 5  Orientation to Place 5  Registration 3  Attention/ Calculation 5  Recall 3  Language- name 2 objects 2  Language- repeat 1  Language- follow 3 step command 3  Language- read & follow direction 1  Write a sentence 1  Copy design 1  Total score 30        04/10/2022   11:55 AM 07/02/2020   12:53 PM 06/29/2019    9:52 AM 03/19/2018   11:33 AM 03/18/2017   11:35 AM  6CIT Screen  What Year? 0 points 0 points 0 points 0 points 0 points  What month? 0 points 0 points 0  points 0 points 0 points  What time? 0 points 0 points 0 points 0 points 0 points  Count back from 20 0 points 0 points 0 points 0 points 0 points  Months in reverse 0 points 0 points 0 points 0 points 0 points  Repeat phrase  0 points 0 points 0 points 0 points  Total Score  0 points 0 points 0 points 0 points    Immunizations Immunization History  Administered  Date(s) Administered   Fluad Quad(high Dose 65+) 08/26/2019, 08/27/2020, 08/21/2021   Influenza Split 09/12/2013   Influenza-Unspecified 09/17/2012, 09/15/2014   Moderna Sars-Covid-2 Vaccination 12/02/2019, 12/30/2019   Pneumococcal Conjugate-13 11/20/2014   Pneumococcal Polysaccharide-23 11/20/2005   Tdap 02/22/2013   Zoster Recombinat (Shingrix) 02/11/2018   Zoster vaccine- agrees to update immunization record. First dose complete.   Screening Tests Health Maintenance  Topic Date Due   COVID-19 Vaccine (3 - Moderna risk series) 04/26/2022 (Originally 01/27/2020)   Zoster Vaccines- Shingrix (2 of 2) 07/11/2022 (Originally 04/08/2018)   INFLUENZA VACCINE  06/17/2022   TETANUS/TDAP  02/23/2023   Pneumonia Vaccine 87+ Years old  Completed   HPV VACCINES  Aged Out   Health Maintenance There are no preventive care reminders to display for this patient.  Lung Cancer Screening: (Low Dose CT Chest recommended if Age 30-80 years, 30 pack-year currently smoking OR have quit w/in 15years.) does not qualify.   Hepatitis C Screening: does not qualify.  Vision Screening: Recommended annual ophthalmology exams for early detection of glaucoma and other disorders of the eye.  Dental Screening: Recommended annual dental exams for proper oral hygiene.  Community Resource Referral / Chronic Care Management: CRR required this visit?  No   CCM required this visit?  No      Plan:   Keep all routine maintenance appointments.   I have personally reviewed and noted the following in the patient's chart:   Medical and social history Use of alcohol, tobacco or illicit drugs  Current medications and supplements including opioid prescriptions. Patient is not currently taking opioid prescriptions. Functional ability and status Nutritional status Physical activity Advanced directives List of other physicians Hospitalizations, surgeries, and ER visits in previous 12 months Vitals Screenings to  include cognitive, depression, and falls Referrals and appointments  In addition, I have reviewed and discussed with patient certain preventive protocols, quality metrics, and best practice recommendations. A written personalized care plan for preventive services as well as general preventive health recommendations were provided to patient.     OBrien-Blaney, Sherril Shipman L, LPN   0/76/2263      I have reviewed the above information and agree with above.   Deborra Medina, MD

## 2022-04-10 NOTE — Patient Instructions (Addendum)
  Jon Sherman , Thank you for taking time to come for your Medicare Wellness Visit. I appreciate your ongoing commitment to your health goals. Please review the following plan we discussed and let me know if I can assist you in the future.   These are the goals we discussed:  Goals       Patient Stated     Maintain Healthy Lifestyle (pt-stated)      Stay active. Stay hydrated.        This is a list of the screening recommended for you and due dates:  Health Maintenance  Topic Date Due   COVID-19 Vaccine (3 - Moderna risk series) 04/26/2022*   Zoster (Shingles) Vaccine (2 of 2) 07/11/2022*   Flu Shot  06/17/2022   Tetanus Vaccine  02/23/2023   Pneumonia Vaccine  Completed   HPV Vaccine  Aged Out  *Topic was postponed. The date shown is not the original due date.

## 2022-04-21 ENCOUNTER — Other Ambulatory Visit: Payer: Self-pay | Admitting: Dermatology

## 2022-04-23 ENCOUNTER — Ambulatory Visit: Payer: Medicare PPO | Admitting: Dermatology

## 2022-04-23 DIAGNOSIS — Z85820 Personal history of malignant melanoma of skin: Secondary | ICD-10-CM | POA: Diagnosis not present

## 2022-04-23 DIAGNOSIS — L578 Other skin changes due to chronic exposure to nonionizing radiation: Secondary | ICD-10-CM

## 2022-04-23 DIAGNOSIS — D229 Melanocytic nevi, unspecified: Secondary | ICD-10-CM

## 2022-04-23 DIAGNOSIS — L814 Other melanin hyperpigmentation: Secondary | ICD-10-CM

## 2022-04-23 DIAGNOSIS — L821 Other seborrheic keratosis: Secondary | ICD-10-CM | POA: Diagnosis not present

## 2022-04-23 DIAGNOSIS — L820 Inflamed seborrheic keratosis: Secondary | ICD-10-CM | POA: Diagnosis not present

## 2022-04-23 DIAGNOSIS — L719 Rosacea, unspecified: Secondary | ICD-10-CM | POA: Diagnosis not present

## 2022-04-23 DIAGNOSIS — D225 Melanocytic nevi of trunk: Secondary | ICD-10-CM

## 2022-04-23 DIAGNOSIS — Z85828 Personal history of other malignant neoplasm of skin: Secondary | ICD-10-CM | POA: Diagnosis not present

## 2022-04-23 DIAGNOSIS — Z1283 Encounter for screening for malignant neoplasm of skin: Secondary | ICD-10-CM | POA: Diagnosis not present

## 2022-04-23 DIAGNOSIS — D1800 Hemangioma unspecified site: Secondary | ICD-10-CM

## 2022-04-23 DIAGNOSIS — Z86006 Personal history of melanoma in-situ: Secondary | ICD-10-CM

## 2022-04-23 MED ORDER — METRONIDAZOLE 0.75 % EX GEL: CUTANEOUS | 11 refills | Status: DC

## 2022-04-23 NOTE — Progress Notes (Signed)
Follow-Up Visit   Subjective  Jon Sherman. is a 88 y.o. male who presents for the following: Annual Exam (Hx BCC, MMIS, MM). The patient presents for Total-Body Skin Exam (TBSE) for skin cancer screening and mole check.  The patient has spots, moles and lesions to be evaluated, some may be new or changing.  Some are irritating on abdomen and under arm and he picks at them.   The following portions of the chart were reviewed this encounter and updated as appropriate:       Review of Systems:  No other skin or systemic complaints except as noted in HPI or Assessment and Plan.  Objective  Well appearing patient in no apparent distress; mood and affect are within normal limits.  A full examination was performed including scalp, head, eyes, ears, nose, lips, neck, chest, axillae, abdomen, back, buttocks, bilateral upper extremities, bilateral lower extremities, hands, feet, fingers, toes, fingernails, and toenails. All findings within normal limits unless otherwise noted below.  Face Mid face erythema with telangiectasias   R lower axilla x 1, L upper abdomen x 1, mid abdomen x 1 (3) Erythematous stuck-on, waxy papule    R spinal upper back 0.7 cm two toned brown macule darker medial  R spinal mid upper back 0.4 cm brown macule    Assessment & Plan  Rosacea Face  Chronic condition with duration or expected duration over one year. Currently well-controlled.   Rosacea is a chronic progressive skin condition usually affecting the face of adults, causing redness and/or acne bumps. It is treatable but not curable. It sometimes affects the eyes (ocular rosacea) as well. It may respond to topical and/or systemic medication and can flare with stress, sun exposure, alcohol, exercise and some foods.  Daily application of broad spectrum spf 30+ sunscreen to face is recommended to reduce flares.  Continue Metronidazole 0.75% gel QD   metroNIDAZOLE (METROGEL) 0.75 % gel -  Face Apply a thin coat to the face QD  Inflamed seborrheic keratosis (3) R lower axilla x 1, L upper abdomen x 1, mid abdomen x 1  Symptomatic, irritating, patient would like treated.   Destruction of lesion - R lower axilla x 1, L upper abdomen x 1, mid abdomen x 1  Destruction method: cryotherapy   Informed consent: discussed and consent obtained   Lesion destroyed using liquid nitrogen: Yes   Region frozen until ice ball extended beyond lesion: Yes   Outcome: patient tolerated procedure well with no complications   Post-procedure details: wound care instructions given   Additional details:  Prior to procedure, discussed risks of blister formation, small wound, skin dyspigmentation, or rare scar following cryotherapy. Recommend Vaseline ointment to treated areas while healing.   Nevus (2) R spinal mid upper back; R spinal upper back  Benign-appearing.  Observation.  Call clinic for new or changing moles.  Recommend daily use of broad spectrum spf 30+ sunscreen to sun-exposed areas.    Lentigines - Scattered tan macules - Due to sun exposure - Benign-appearing, observe - Recommend daily broad spectrum sunscreen SPF 30+ to sun-exposed areas, reapply every 2 hours as needed. - Call for any changes  Seborrheic Keratoses - Stuck-on, waxy, tan-brown papules and/or plaques  - Benign-appearing - Discussed benign etiology and prognosis. - Observe - Call for any changes  Melanocytic Nevi - Tan-brown and/or pink-flesh-colored symmetric macules and papules - Benign appearing on exam today - Observation - Call clinic for new or changing moles - Recommend daily use of  broad spectrum spf 30+ sunscreen to sun-exposed areas.   Hemangiomas - Red papules - Discussed benign nature - Observe - Call for any changes  Actinic Damage - Chronic condition, secondary to cumulative UV/sun exposure - diffuse scaly erythematous macules with underlying dyspigmentation - Recommend daily broad  spectrum sunscreen SPF 30+ to sun-exposed areas, reapply every 2 hours as needed.  - Staying in the shade or wearing long sleeves, sun glasses (UVA+UVB protection) and wide brim hats (4-inch brim around the entire circumference of the hat) are also recommended for sun protection.  - Call for new or changing lesions.  History of Melanoma - No evidence of recurrence today - Recommend regular full body skin exams - Recommend daily broad spectrum sunscreen SPF 30+ to sun-exposed areas, reapply every 2 hours as needed.  - Call if any new or changing lesions are noted between office visits  History of Melanoma in Situ - No evidence of recurrence today - Recommend regular full body skin exams - Recommend daily broad spectrum sunscreen SPF 30+ to sun-exposed areas, reapply every 2 hours as needed.  - Call if any new or changing lesions are noted between office visits  History of Basal Cell Carcinoma of the Skin - No evidence of recurrence today - Recommend regular full body skin exams - Recommend daily broad spectrum sunscreen SPF 30+ to sun-exposed areas, reapply every 2 hours as needed.  - Call if any new or changing lesions are noted between office visits  Skin cancer screening performed today.  Return in about 6 months (around 10/23/2022) for TBSE.  Luther Redo, CMA, am acting as scribe for Brendolyn Patty, MD .  Documentation: I have reviewed the above documentation for accuracy and completeness, and I agree with the above.  Brendolyn Patty MD

## 2022-04-23 NOTE — Patient Instructions (Signed)
Due to recent changes in healthcare laws, you may see results of your pathology and/or laboratory studies on MyChart before the doctors have had a chance to review them. We understand that in some cases there may be results that are confusing or concerning to you. Please understand that not all results are received at the same time and often the doctors may need to interpret multiple results in order to provide you with the best plan of care or course of treatment. Therefore, we ask that you please give us 2 business days to thoroughly review all your results before contacting the office for clarification. Should we see a critical lab result, you will be contacted sooner.   If You Need Anything After Your Visit  If you have any questions or concerns for your doctor, please call our main line at [phone] and press option 4 to reach your doctor's medical assistant. If no one answers, please leave a voicemail as directed and we will return your call as soon as possible. Messages left after 4 pm will be answered the following business day.   You may also send us a message via MyChart. We typically respond to MyChart messages within 1-2 business days.  For prescription refills, please ask your pharmacy to contact our office. Our fax number is [phone].  If you have an urgent issue when the clinic is closed that cannot wait until the next business day, you can page your doctor at the number below.    Please note that while we do our best to be available for urgent issues outside of office hours, we are not available 24/7.   If you have an urgent issue and are unable to reach us, you may choose to seek medical care at your doctor's office, retail clinic, urgent care center, or emergency room.  If you have a medical emergency, please immediately call 911 or go to the emergency department.  Pager Numbers  - Dr. Kowalski: [phone]  - Dr. Moye: [phone]  - Dr. Stewart:  [phone]  In the event of inclement weather, please call our main line at [phone] for an update on the status of any delays or closures.  Dermatology Medication Tips: Please keep the boxes that topical medications come in in order to help keep track of the instructions about where and how to use these. Pharmacies typically print the medication instructions only on the boxes and not directly on the medication tubes.   If your medication is too expensive, please contact our office at [phone] option 4 or send us a message through MyChart.   We are unable to tell what your co-pay for medications will be in advance as this is different depending on your insurance coverage. However, we may be able to find a substitute medication at lower cost or fill out paperwork to get insurance to cover a needed medication.   If a prior authorization is required to get your medication covered by your insurance company, please allow us 1-2 business days to complete this process.  Drug prices often vary depending on where the prescription is filled and some pharmacies may offer cheaper prices.  The website www.goodrx.com contains coupons for medications through different pharmacies. The prices here do not account for what the cost may be with help from insurance (it may be cheaper with your insurance), but the website can give you the price if you did not use any insurance.  - You can print the associated coupon and take it with   your prescription to the pharmacy.  - You may also stop by our office during regular business hours and pick up a GoodRx coupon card.  - If you need your prescription sent electronically to a different pharmacy, notify our office through [place] [facility] MyChart or by phone at [phone] option 4.     Si Usted Necesita Algo Despus de Su Visita  Tambin puede enviarnos un mensaje a travs de MyChart. Por lo general respondemos a los mensajes de MyChart en el transcurso de 1 a 2  das hbiles.  Para renovar recetas, por favor pida a su farmacia que se ponga en contacto con nuestra oficina. Nuestro nmero de fax es el [phone].  Si tiene un asunto urgente cuando la clnica est cerrada y que no puede esperar hasta el siguiente da hbil, puede llamar/localizar a su doctor(a) al nmero que aparece a continuacin.   Por favor, tenga en cuenta que aunque hacemos todo lo posible para estar disponibles para asuntos urgentes fuera del horario de oficina, no estamos disponibles las 24 horas del da, los 7 das de la semana.   Si tiene un problema urgente y no puede comunicarse con nosotros, puede optar por buscar atencin mdica  en el consultorio de su doctor(a), en una clnica privada, en un centro de atencin urgente o en una sala de emergencias.  Si tiene una emergencia mdica, por favor llame inmediatamente al 911 o vaya a la sala de emergencias.  Nmeros de bper  - Dr. Kowalski: [phone]  - Dra. Moye: [phone]  - Dra. Stewart: [phone]  En caso de inclemencias del tiempo, por favor llame a nuestra lnea principal al [phone] para una actualizacin sobre el estado de cualquier retraso o cierre.  Consejos para la medicacin en dermatologa: Por favor, guarde las cajas en las que vienen los medicamentos de uso tpico para ayudarle a seguir las instrucciones sobre dnde y cmo usarlos. Las farmacias generalmente imprimen las instrucciones del medicamento slo en las cajas y no directamente en los tubos del medicamento.   Si su medicamento es muy caro, por favor, pngase en contacto con nuestra oficina llamando al [phone] y presione la opcin 4 o envenos un mensaje a travs de MyChart.   No podemos decirle cul ser su copago por los medicamentos por adelantado ya que esto es diferente dependiendo de la cobertura de su seguro. Sin embargo, es posible que podamos encontrar un medicamento sustituto a menor costo o llenar un formulario para que el  seguro cubra el medicamento que se considera necesario.   Si se requiere una autorizacin previa para que su compaa de seguros cubra su medicamento, por favor permtanos de 1 a 2 das hbiles para completar este proceso.  Los precios de los medicamentos varan con frecuencia dependiendo del lugar de dnde se surte la receta y alguna farmacias pueden ofrecer precios ms baratos.  El sitio web www.goodrx.com tiene cupones para medicamentos de diferentes farmacias. Los precios aqu no tienen en cuenta lo que podra costar con la ayuda del seguro (puede ser ms barato con su seguro), pero el sitio web puede darle el precio si no utiliz ningn seguro.  - Puede imprimir el cupn correspondiente y llevarlo con su receta a la farmacia.  - Tambin puede pasar por nuestra oficina durante el horario de atencin regular y recoger una tarjeta de cupones de GoodRx.  - Si necesita que su receta se enve electrnicamente a una farmacia diferente, informe a nuestra oficina a travs de MyChart de [place]   o por telfono llamando al [phone] y presione la opcin 4.  

## 2022-05-28 DIAGNOSIS — Z87891 Personal history of nicotine dependence: Secondary | ICD-10-CM | POA: Diagnosis not present

## 2022-05-28 DIAGNOSIS — L719 Rosacea, unspecified: Secondary | ICD-10-CM | POA: Diagnosis not present

## 2022-05-28 DIAGNOSIS — H3530 Unspecified macular degeneration: Secondary | ICD-10-CM | POA: Diagnosis not present

## 2022-06-23 DIAGNOSIS — H353211 Exudative age-related macular degeneration, right eye, with active choroidal neovascularization: Secondary | ICD-10-CM | POA: Diagnosis not present

## 2022-08-20 ENCOUNTER — Telehealth: Payer: Self-pay | Admitting: Internal Medicine

## 2022-08-20 ENCOUNTER — Ambulatory Visit: Payer: Medicare PPO | Admitting: Internal Medicine

## 2022-08-20 ENCOUNTER — Encounter: Payer: Self-pay | Admitting: Internal Medicine

## 2022-08-20 VITALS — BP 120/60 | HR 72 | Temp 97.6°F | Ht 69.0 in | Wt 188.0 lb

## 2022-08-20 DIAGNOSIS — R7301 Impaired fasting glucose: Secondary | ICD-10-CM | POA: Diagnosis not present

## 2022-08-20 DIAGNOSIS — R5383 Other fatigue: Secondary | ICD-10-CM

## 2022-08-20 DIAGNOSIS — Z0001 Encounter for general adult medical examination with abnormal findings: Secondary | ICD-10-CM

## 2022-08-20 DIAGNOSIS — R296 Repeated falls: Secondary | ICD-10-CM | POA: Diagnosis not present

## 2022-08-20 DIAGNOSIS — E785 Hyperlipidemia, unspecified: Secondary | ICD-10-CM

## 2022-08-20 DIAGNOSIS — R42 Dizziness and giddiness: Secondary | ICD-10-CM

## 2022-08-20 LAB — COMPREHENSIVE METABOLIC PANEL
ALT: 10 U/L (ref 0–53)
AST: 12 U/L (ref 0–37)
Albumin: 4.1 g/dL (ref 3.5–5.2)
Alkaline Phosphatase: 66 U/L (ref 39–117)
BUN: 19 mg/dL (ref 6–23)
CO2: 30 mEq/L (ref 19–32)
Calcium: 9 mg/dL (ref 8.4–10.5)
Chloride: 104 mEq/L (ref 96–112)
Creatinine, Ser: 1.16 mg/dL (ref 0.40–1.50)
GFR: 54.75 mL/min — ABNORMAL LOW (ref >60.00)
Glucose, Bld: 98 mg/dL (ref 70–99)
Potassium: 4.6 mEq/L (ref 3.5–5.1)
Sodium: 139 mEq/L (ref 135–145)
Total Bilirubin: 0.7 mg/dL (ref 0.2–1.2)
Total Protein: 6.2 g/dL (ref 6.0–8.3)

## 2022-08-20 LAB — CBC WITH DIFFERENTIAL/PLATELET
Basophils Absolute: 0 10*3/uL (ref 0.0–0.1)
Basophils Relative: 0.6 % (ref 0.0–3.0)
Eosinophils Absolute: 0.1 10*3/uL (ref 0.0–0.7)
Eosinophils Relative: 1.3 % (ref 0.0–5.0)
HCT: 42.9 % (ref 39.0–52.0)
Hemoglobin: 14.4 g/dL (ref 13.0–17.0)
Lymphocytes Relative: 14.3 % (ref 12.0–46.0)
Lymphs Abs: 1.1 10*3/uL (ref 0.7–4.0)
MCHC: 33.5 g/dL (ref 30.0–36.0)
MCV: 94.4 fl (ref 78.0–100.0)
Monocytes Absolute: 0.7 10*3/uL (ref 0.1–1.0)
Monocytes Relative: 8.8 % (ref 3.0–12.0)
Neutro Abs: 5.8 10*3/uL (ref 1.4–7.7)
Neutrophils Relative %: 75 % (ref 43.0–77.0)
Platelets: 211 10*3/uL (ref 150.0–400.0)
RBC: 4.54 Mil/uL (ref 4.22–5.81)
RDW: 13 % (ref 11.5–15.5)
WBC: 7.7 10*3/uL (ref 4.0–10.5)

## 2022-08-20 LAB — LIPID PANEL
Cholesterol: 149 mg/dL (ref 0–200)
HDL: 49.1 mg/dL (ref >39.00)
LDL Cholesterol: 64 mg/dL (ref 0–99)
NonHDL: 100.16
Total CHOL/HDL Ratio: 3
Triglycerides: 181 mg/dL — ABNORMAL HIGH (ref 0.0–149.0)
VLDL: 36.2 mg/dL (ref 0.0–40.0)

## 2022-08-20 LAB — B12 AND FOLATE PANEL
Folate: 5.7 ng/mL — ABNORMAL LOW (ref >5.9)
Vitamin B-12: 149 pg/mL — ABNORMAL LOW (ref 211–911)

## 2022-08-20 LAB — LDL CHOLESTEROL, DIRECT: Direct LDL: 86 mg/dL

## 2022-08-20 LAB — TSH: TSH: 2.28 u[IU]/mL (ref 0.35–5.50)

## 2022-08-20 NOTE — Patient Instructions (Signed)
Your blood pressure did not drop with the upright position  I am ordering blood tests and an MRI of your brain to investigate the "dull feeling" in your head, as well as PT referral for balance

## 2022-08-20 NOTE — Progress Notes (Signed)
Patient ID: Jon Sherman., male    DOB: May 18, 1928  Age: 88 y.o. MRN: [id number]  The patient is here for annual preventive examination and management of other chronic and acute problems.   The risk factors are reflected in the social history.  The roster of all physicians providing medical care to patient - is listed in the Snapshot section of the chart.  Activities of daily living:  The patient is 100% independent in all ADLs: dressing, toileting, feeding as well as independent mobility  Home safety : The patient has smoke detectors in the home. They wear seatbelts.  There are no firearms at home. There is no violence in the home.   There is no risks for hepatitis, STDs or HIV. There is no   history of blood transfusion. They have no travel history to infectious disease endemic areas of the world.  The patient has seen their dentist in the last six month. They have seen their eye doctor in the last year. They admit to slight hearing difficulty with regard to whispered voices and some television programs.  They have deferred audiologic testing in the last year.  They do not  have excessive sun exposure. Discussed the need for sun protection: hats, long sleeves and use of sunscreen if there is significant sun exposure.   Diet: the importance of a healthy diet is discussed. They do have a healthy diet.  The benefits of regular aerobic exercise were discussed. he walks 4 times per week ,  20 minutes.   Depression screen: there are no signs or vegative symptoms of depression- irritability, change in appetite, anhedonia, sadness/tearfullness.  Cognitive assessment: the patient manages all their financial and personal affairs and is actively engaged. They could relate day,date,year and events; recalled 2/3 objects at 3 minutes; performed clock-face test normally.  The following portions of the patient's history were reviewed and updated as appropriate: allergies, current medications, past  family history, past medical history,  past surgical history, past social history  and problem list.  Visual acuity was not assessed per patient preference since she has regular follow up with her ophthalmologist. Hearing and body mass index were assessed and reviewed.   During the course of the visit the patient was educated and counseled about appropriate screening and preventive services including : fall prevention , diabetes screening, nutrition counseling, colorectal cancer screening, and recommended immunizations.    CC: The primary encounter diagnosis was Fatigue, unspecified type. Diagnoses of Dyslipidemia, Impaired fasting glucose, Dizziness, Encounter for general adult medical examination with abnormal findings, and Recurrent falls were also pertinent to this visit.    1) Recurrent falls, 3 In the last 2 weeks. .  Reports loss of balance every time he bends over  . Most recently occurred today while bending over dishwasher.  2 days ago occurred in the shower ,  was able to prevent fall by grabbing the safety bars. . First fall occurred after getting out of car  when he ducked .     No recent viral illness.  But feels tired  and dizzy  most of the time.  Denies headaches, but head feels heavy.  denies recent GI illness or volume  loss . No black stools.  Has had a 4 lb weight loss this year  appetite has been diminished . Denies pain but feels "dull headed" denies vision changes .has macular degeneration in the right eye .   History of melanoma     2) Recent COVID exposure on  Sept 30 . No symptoms   History Elohim has a past medical history of Basal cell carcinoma (06/29/2018), Cancer ([place]) (1965), COPD (chronic obstructive pulmonary disease) ([place]), Infectious colitis (2000), Influenza (02/18/2018), Melanoma ([place]) (1964), Melanoma in situ ([place]) (10/09/2020), Melanoma in situ of right lower extremity ([place]) (07/05/2018), Melanoma of face ([place]) (08/20/2020), Rosacea, and Vertigo.   He has a past  surgical history that includes Melanoma excision with sentinel lymph node dissection; Soft Tissue Tumor Resection (1985); Cataract extraction w/PHACO (Right, 06/29/2019); and Cataract extraction w/PHACO (Left, 08/31/2019).   His family history includes Heart failure in his father and mother; Mental illness in his mother; Rheumatic fever in his sister.He reports that he quit smoking about 60 years ago. His smoking use included cigarettes. He has a 10.00 pack-year smoking history. He has never used smokeless tobacco. He reports current alcohol use of about 7.0 standard drinks of alcohol per week. He reports that he does not use drugs.  Outpatient Medications Prior to Visit  Medication Sig Dispense Refill   metroNIDAZOLE (METROGEL) 0.75 % gel Apply a thin coat to the face QD 45 g 11   Multiple Vitamins-Minerals (PRESERVISION AREDS 2) CAPS Take 1 capsule by mouth 2 (two) times daily.     ciclopirox (LOPROX) 0.77 % cream Apply topically 2 (two) times daily. (Patient not taking: Reported on 08/20/2022) 30 g 1   ergocalciferol (DRISDOL) 1.25 MG (50000 UT) capsule Take 1 capsule (50,000 Units total) by mouth once a week. (Patient not taking: Reported on 04/10/2022) 12 capsule 0   No facility-administered medications prior to visit.    Review of Systems  Patient denies headache, fevers, malaise,  skin rash, eye pain, sinus congestion and sinus pain, sore throat, dysphagia,  hemoptysis , cough, dyspnea, wheezing, chest pain, palpitations, orthopnea, edema, abdominal pain, nausea, melena, diarrhea, constipation, flank pain, dysuria, hematuria, urinary  Frequency, nocturia, numbness, tingling, seizures,  Focal weakness, Loss of consciousness,  Tremor, insomnia, depression, anxiety, and suicidal ideation.     Objective:  BP 120/60 (BP Location: Left Arm, Patient Position: Sitting, Cuff Size: Normal)   Pulse 72   Temp 97.6 F (36.4 C) (Oral)   Ht 5' 9"  (1.753 m)   Wt 188 lb (85.3 kg)   SpO2 97%   BMI 27.76  kg/m   Physical Exam   General appearance: alert, cooperative and appears stated age Ears: normal TM's and external ear canals both ears Throat: lips, mucosa, and tongue normal; teeth and gums normal Neck: no adenopathy, no carotid bruit, supple, symmetrical, trachea midline and thyroid not enlarged, symmetric, no tenderness/mass/nodules Back: symmetric, no curvature. ROM normal. No CVA tenderness. Lungs: clear to auscultation bilaterally Heart: regular rate and rhythm, S1, S2 normal, no murmur, click, rub or gallop Abdomen: soft, non-tender; bowel sounds normal; no masses,  no organomegaly Pulses: 2+ and symmetric Skin: Skin color, texture, turgor normal. No rashes or lesions Lymph nodes: Cervical, supraclavicular, and axillary nodes normal.  Neuro: CNs 2-12 intact. DTRs 2+/4 in biceps, brachioradialis, patellars and achilles. Muscle strength 5/5 in upper , 4+/5 IN  lower exremities. Fine resting tremor bilaterally both hands cerebellar function normal. Romberg POSITIVE .  No pronator drift.   Gait normal.    Assessment & Plan:   Problem List Items Addressed This Visit     Encounter for general adult medical examination with abnormal findings    He has orthostatic symptoms with abrupt changes in position,  Proximal muscle weakness and positive Romberg sign.  Referring to PT at [facility]  [place]. Advised to use shower chair and walker to prevent falls.  Serologies for B12 deficien y pending       Recurrent falls    NEW ONSET LOSS OF BALANCE WITH  BENDING OVER ,  WITH 3 FALLS IN THE LAST 2 WEEKS. . Orthostasis ruled out in the office with vital signs.  History of melanoma, and new onset "head feeling heavy" and dull .  MRI brain ordered to rule out mass .  PT referral to [facility]       Relevant Orders   MR Brain Wo Contrast   Other Visit Diagnoses     Fatigue, unspecified type    -  Primary   Relevant Orders   TSH   Dyslipidemia       Relevant Orders   Lipid Profile   Direct LDL    Impaired fasting glucose       Relevant Orders   Comp Met (CMET)   Dizziness       Relevant Orders   Ambulatory referral to Physical Therapy   CBC with Differential/Platelet   B12 and Folate Panel   MR Brain Wo Contrast       I am having [name] maintain his PreserVision AREDS 2, ergocalciferol, ciclopirox, and metroNIDAZOLE.  No orders of the defined types were placed in this encounter.   There are no discontinued medications.  Follow-up: No follow-ups on file.   Crecencio Mc, MD

## 2022-08-20 NOTE — Assessment & Plan Note (Signed)
He has orthostatic symptoms with abrupt changes in position,  Proximal muscle weakness and positive Romberg sign.  Referring to PT at [facility]. Advised to use shower chair and walker to prevent falls.  Serologies for B12 deficien y pending

## 2022-08-20 NOTE — Telephone Encounter (Signed)
Lft pt vm to call ofc . thanks 

## 2022-08-20 NOTE — Assessment & Plan Note (Signed)
NEW ONSET LOSS OF BALANCE WITH  BENDING OVER ,  WITH 3 FALLS IN THE LAST 2 WEEKS. . Orthostasis ruled out in the office with vital signs.  History of melanoma, and new onset "head feeling heavy" and dull .  MRI brain ordered to rule out mass .  PT referral to [facility]

## 2022-08-23 ENCOUNTER — Other Ambulatory Visit: Payer: Self-pay | Admitting: Internal Medicine

## 2022-08-23 ENCOUNTER — Encounter: Payer: Self-pay | Admitting: Internal Medicine

## 2022-08-23 DIAGNOSIS — E538 Deficiency of other specified B group vitamins: Secondary | ICD-10-CM

## 2022-08-23 DIAGNOSIS — E559 Vitamin D deficiency, unspecified: Secondary | ICD-10-CM

## 2022-08-23 MED ORDER — VITAMIN B-12 1000 MCG PO TABS: 1000.0000 ug | ORAL_TABLET | Freq: Every day | ORAL | 1 refills | Status: DC

## 2022-08-23 MED ORDER — FOLIC ACID 1 MG PO TABS: 1.0000 mg | ORAL_TABLET | Freq: Every day | ORAL | 3 refills | Status: DC

## 2022-08-23 NOTE — Assessment & Plan Note (Signed)
Deficient of folate as well.  Checked for workup of  Dizziness  Lab Results  Component Value Date   VITAMINB12 149 (L) 08/20/2022   Lab Results  Component Value Date   FOLATE 5.7 (L) 08/20/2022

## 2022-08-28 ENCOUNTER — Telehealth: Payer: Self-pay

## 2022-08-28 NOTE — Telephone Encounter (Signed)
-----   Message from Crecencio Mc, MD sent at 08/23/2022 10:04 AM EDT ----- Needs B12 injections weekly x 4,  and lab viisit during first injection

## 2022-08-29 ENCOUNTER — Ambulatory Visit
Admission: RE | Admit: 2022-08-29 | Discharge: 2022-08-29 | Disposition: A | Discharge status: Home / Self Care | Payer: Medicare PPO | Source: Ambulatory Visit | Attending: Internal Medicine | Admitting: Internal Medicine

## 2022-08-29 DIAGNOSIS — R296 Repeated falls: Secondary | ICD-10-CM | POA: Diagnosis not present

## 2022-08-29 DIAGNOSIS — R42 Dizziness and giddiness: Secondary | ICD-10-CM | POA: Diagnosis not present

## 2022-08-29 DIAGNOSIS — G319 Degenerative disease of nervous system, unspecified: Secondary | ICD-10-CM | POA: Diagnosis not present

## 2022-09-01 ENCOUNTER — Telehealth: Payer: Self-pay

## 2022-09-01 ENCOUNTER — Encounter: Payer: Self-pay | Admitting: Internal Medicine

## 2022-09-01 DIAGNOSIS — G9389 Other specified disorders of brain: Secondary | ICD-10-CM

## 2022-09-01 NOTE — Telephone Encounter (Signed)
Received a call report on pt's MRI. Dr. Derrel Nip has already seen the results and let the pt know. Pt has seen the [employer].

## 2022-09-02 ENCOUNTER — Ambulatory Visit (INDEPENDENT_AMBULATORY_CARE_PROVIDER_SITE_OTHER): Payer: Medicare PPO

## 2022-09-02 DIAGNOSIS — E538 Deficiency of other specified B group vitamins: Secondary | ICD-10-CM | POA: Diagnosis not present

## 2022-09-02 MED ORDER — CYANOCOBALAMIN 1000 MCG/ML IJ SOLN
1000.0000 ug | Freq: Once | INTRAMUSCULAR | Status: AC
  Administered 2022-09-02: 1000 ug via INTRAMUSCULAR

## 2022-09-02 NOTE — Progress Notes (Cosign Needed Addendum)
Patient presented for B 12 injection to right deltoid, patient voiced no concerns nor showed any signs of distress during injection. 

## 2022-09-03 DIAGNOSIS — R2689 Other abnormalities of gait and mobility: Secondary | ICD-10-CM | POA: Diagnosis not present

## 2022-09-03 DIAGNOSIS — Z9181 History of falling: Secondary | ICD-10-CM | POA: Diagnosis not present

## 2022-09-16 DIAGNOSIS — R2689 Other abnormalities of gait and mobility: Secondary | ICD-10-CM | POA: Diagnosis not present

## 2022-09-16 DIAGNOSIS — Z9181 History of falling: Secondary | ICD-10-CM | POA: Diagnosis not present

## 2022-09-18 DIAGNOSIS — R2689 Other abnormalities of gait and mobility: Secondary | ICD-10-CM | POA: Diagnosis not present

## 2022-09-18 DIAGNOSIS — Z9181 History of falling: Secondary | ICD-10-CM | POA: Diagnosis not present

## 2022-09-19 ENCOUNTER — Ambulatory Visit: Payer: Medicare PPO | Admitting: Internal Medicine

## 2022-09-19 ENCOUNTER — Other Ambulatory Visit: Payer: Self-pay

## 2022-09-19 ENCOUNTER — Encounter: Payer: Self-pay | Admitting: Internal Medicine

## 2022-09-19 VITALS — BP 140/74 | HR 77 | Ht 69.0 in | Wt 189.2 lb

## 2022-09-19 DIAGNOSIS — Z0000 Encounter for general adult medical examination without abnormal findings: Secondary | ICD-10-CM

## 2022-09-19 DIAGNOSIS — E538 Deficiency of other specified B group vitamins: Secondary | ICD-10-CM | POA: Diagnosis not present

## 2022-09-19 DIAGNOSIS — Z23 Encounter for immunization: Secondary | ICD-10-CM | POA: Diagnosis not present

## 2022-09-19 DIAGNOSIS — E559 Vitamin D deficiency, unspecified: Secondary | ICD-10-CM | POA: Diagnosis not present

## 2022-09-19 MED ORDER — CYANOCOBALAMIN 1000 MCG/ML IJ SOLN
1000.0000 ug | Freq: Once | INTRAMUSCULAR | Status: AC
  Administered 2022-09-19: 1000 ug via INTRAMUSCULAR

## 2022-09-19 NOTE — Patient Instructions (Addendum)
You received your 3rd B12 injection today.  Return in one week for your 4th injection.  For your insomnia  You might want to try using Relaxium  It is available through [employer] and contains all natural supplements:  Melatonin 5 mg  Chamomile 25 mg Passionflower extract 75 mg GABA 100 mg Ashwaganda extract 125 mg Magnesium citrate, glycinate, oxide (100 mg)  L tryptophan 500 mg Valerest (proprietary  ingredient ; probably valeria root extract)    To prevent constipation : You need about 60 ounces of water daily  and 25 grams of fiber    There are 4 categories of laxatives.  They can be combined,  But some should not be used daily .  Bulk  forming laxatives  can be used daily  Citrucel, benefiber, metamucil, Fibercon, )  Miralax and stool softener (docusate ) :  ok to use daily    Stimulant laxatives (Ex Lax,  Correctol,  Senna,  Dulcolax)  :  avoid on a daily basis, not more than 2/weel   Cathartic laxatives ( MOM,  Mag citrate, Lactulose ) : not more than 2 /week    Ok to take plain  magnesium in capsule form   With or without the stool softener daily

## 2022-09-19 NOTE — Progress Notes (Unsigned)
Patient ID: Jon Sherman., male    DOB: April 19, 1928  Age: 88 y.o. MRN: [id number]  The patient is here for annual preventive  examination and management of other chronic and acute problems.   The risk factors are reflected in the social history.  The roster of all physicians providing medical care to patient - is listed in the Snapshot section of the chart.  Activities of daily living:  The patient is 100% independent in all ADLs: dressing, toileting, feeding as well as independent mobility: He and [name] live independently at [facility] for the past 18 years.  but he feels that they would be better off in Assisted Living and have placed their names on the waiting list.    Home safety : The patient has smoke detectors in the home. They wear seatbelts.  There are no firearms at home. There is no violence in the home.   There is no risks for hepatitis, STDs or HIV. There is no   history of blood transfusion. They have no travel history to infectious disease endemic areas of the world.  The patient has seen their dentist in the last six month. They have seen their eye doctor in the last year. They admit to slight hearing difficulty with regard to whispered voices and some television programs.  They have deferred audiologic testing in the last year.  They do not  have excessive sun exposure. Discussed the need for sun protection: hats, long sleeves and use of sunscreen if there is significant sun exposure.   Diet: the importance of a healthy diet is discussed. They do have a healthy diet.  The benefits of regular aerobic exercise were discussed. She walks 4 times per week ,  20 minutes.   Depression screen: there are no observed  symptoms of depression- irritability, c anhedonia, sadness,  secondary to his wife's deteriorating memory, and perseverative obsession with having a uTI   Cognitive assessment: the patient manages all their financial and personal affairs and is actively engaged. They  could relate day,date,year and events; recalled 2/3 objects at 3 minutes; performed clock-face test normally.  The following portions of the patient's history were reviewed and updated as appropriate: allergies, current medications, past family history, past medical history,  past surgical history, past social history  and problem list.  Visual acuity was not assessed per patient preference since she has regular follow up with her ophthalmologist. Hearing and body mass index were assessed and reviewed.   During the course of the visit the patient was educated and counseled about appropriate screening and preventive services including : fall prevention , diabetes screening, nutrition counseling, colorectal cancer screening, and recommended immunizations.    CC: The primary encounter diagnosis was Need for pneumococcal vaccine. Diagnoses of Vitamin D deficiency, B12 deficiency, and Routine general medical examination at a health care facility were also pertinent to this visit.  History [name] has a past medical history of Basal cell carcinoma (06/29/2018), Cancer ([place]) (1965), COPD (chronic obstructive pulmonary disease) ([place]), Infectious colitis (2000), Influenza (02/18/2018), Melanoma ([place]) (1964), Melanoma in situ ([employer]) (10/09/2020), Melanoma in situ of right lower extremity ([place]) (07/05/2018), Melanoma of face ([place]) (08/20/2020), Rosacea, and Vertigo.   He has a past surgical history that includes Melanoma excision with sentinel lymph node dissection; Soft Tissue Tumor Resection (1985); Cataract extraction w/PHACO (Right, 06/29/2019); and Cataract extraction w/PHACO (Left, 08/31/2019).   His family history includes Heart failure in his father and mother; Mental illness in his mother; Rheumatic fever  in his sister.He reports that he quit smoking about 60 years ago. His smoking use included cigarettes. He has a 10.00 pack-year smoking history. He has never used smokeless tobacco. He reports current  alcohol use of about 7.0 standard drinks of alcohol per week. He reports that he does not use drugs.  Outpatient Medications Prior to Visit  Medication Sig Dispense Refill   ciclopirox (LOPROX) 0.77 % cream Apply topically 2 (two) times daily. 30 g 1   cyanocobalamin (VITAMIN B12) 1000 MCG tablet Take 1 tablet (1,000 mcg total) by mouth daily. 90 tablet 1   folic acid (FOLVITE) 1 MG tablet Take 1 tablet (1 mg total) by mouth daily. 90 tablet 3   metroNIDAZOLE (METROGEL) 0.75 % gel Apply a thin coat to the face QD 45 g 11   Multiple Vitamins-Minerals (PRESERVISION AREDS 2) CAPS Take 1 capsule by mouth 2 (two) times daily.     ergocalciferol (DRISDOL) 1.25 MG (50000 UT) capsule Take 1 capsule (50,000 Units total) by mouth once a week. (Patient not taking: Reported on 04/10/2022) 12 capsule 0   No facility-administered medications prior to visit.    Review of Systems  Patient denies headache, fevers, malaise, unintentional weight loss, skin rash, eye pain, sinus congestion and sinus pain, sore throat, dysphagia,  hemoptysis , cough, dyspnea, wheezing, chest pain, palpitations, orthopnea, edema, abdominal pain, nausea, melena, diarrhea, constipation, flank pain, dysuria, hematuria, urinary  Frequency, nocturia, numbness, tingling, seizures,  Focal weakness, Loss of consciousness,  Tremor, insomnia, depression, anxiety, and suicidal ideation.     Objective:  BP (!) 140/74 (BP Location: Left Arm, Patient Position: Sitting, Cuff Size: Normal)   Pulse 77   Ht '5\' 9"'$  (1.753 m)   Wt 189 lb 3.2 oz (85.8 kg)   SpO2 97%   BMI 27.94 kg/m   Physical Exam   General appearance: alert, cooperative and appears stated age Ears: normal TM's and external ear canals both ears Throat: lips, mucosa, and tongue normal; teeth and gums normal Neck: no adenopathy, no carotid bruit, supple, symmetrical, trachea midline and thyroid not enlarged, symmetric, no tenderness/mass/nodules Back: symmetric, no curvature.  ROM normal. No CVA tenderness. Lungs: clear to auscultation bilaterally Heart: regular rate and rhythm, S1, S2 normal, no murmur, click, rub or gallop Abdomen: soft, non-tender; bowel sounds normal; no masses,  no organomegaly Pulses: 2+ and symmetric Skin: Skin color, texture, turgor normal. No rashes or lesions Lymph nodes: Cervical, supraclavicular, and axillary nodes normal.    Assessment & Plan:   Problem List Items Addressed This Visit     Routine general medical examination at a health care facility    age appropriate education and counseling updated, referrals for preventative services and immunizations addressed, dietary and smoking counseling addressed, most recent labs reviewed.  I have personally reviewed and have noted:   1) the patient's medical and social history 2) The pt's use of alcohol, tobacco, and illicit drugs 3) The patient's current medications and supplements 4) Functional ability including ADL's, fall risk, home safety risk, hearing and visual impairment 5) Diet and physical activities 6) Evidence for depression or mood disorder 7) The patient's height, weight, and BMI have been recorded in the chartI have made referrals, and provided counseling and education based on review of the above       Vitamin D deficiency   B12 deficiency   Other Visit Diagnoses     Need for pneumococcal vaccine    -  Primary   Relevant Orders  Pneumococcal conjugate vaccine 20-valent (Prevnar 20) (Completed)       I have discontinued Shon Millet Jr.'s ergocalciferol. I am also having him maintain his PreserVision AREDS 2, ciclopirox, metroNIDAZOLE, folic acid, and cyanocobalamin. We administered cyanocobalamin.      Follow-up: Return in about 1 week (around 09/26/2022).   Crecencio Mc, MD

## 2022-09-19 NOTE — Telephone Encounter (Signed)
LMTCB regarding scheduling his B12 injection for next week

## 2022-09-20 NOTE — Assessment & Plan Note (Signed)

## 2022-09-22 NOTE — Progress Notes (Unsigned)
Referring Physician:  Crecencio Mc, MD [place],   [id number]  Primary Physician:  Crecencio Mc, MD  History of Present Illness: 09/23/2022 Jon Sherman is here today with a chief complaint of loss of balance, falls and dizziness over the past 6 weeks. He is currently participating in physical therapy at [facility]. He feels his symptoms have improved since starting physical therapy, B12 injections, and folic acid supplements.  He had an MRI scan of the brain which showed a dural based lesion.  He presents today for evaluation.  He has no complaints.  Past Surgery:  denies   Review of Systems:  A 10 point review of systems is negative, except for the pertinent positives and negatives detailed in the HPI.  Past Medical History: Past Medical History:  Diagnosis Date   Basal cell carcinoma 06/29/2018   right forehead   Cancer ([place]) 1965   melanoma, LNs resected   COPD (chronic obstructive pulmonary disease) ([place])    Infectious colitis 2000   requiring hospitalization   Influenza 02/18/2018   Melanoma ([place]) 1964   right abdomen   Melanoma in situ ([place]) 10/09/2020   right ear helix, MOHs 12/10/20 with Dr. Lacinda Axon   Melanoma in situ of right lower extremity ([place]) 07/05/2018   right medial knee. Excised, margins free.   Melanoma of face ([place]) 08/20/2020   right lower cheek 0.63m, Clarks Level II, Mohs with Dr. CLacinda Axon11/12/21   Rosacea    Vertigo    1 episode (2019)    Past Surgical History: Past Surgical History:  Procedure Laterality Date   CATARACT EXTRACTION W/PHACO Right 06/29/2019   Procedure: CATARACT EXTRACTION PHACO AND INTRAOCULAR LENS PLACEMENT [place];  Surgeon: BLeandrew Koyanagi MD;  [place]  Service: Ophthalmology;  Laterality: Right;   CATARACT EXTRACTION W/PHACO Left 08/31/2019   Procedure: CATARACT EXTRACTION PHACO AND INTRAOCULAR LENS PLACEMENT (IOC) LEFT 02:06.7  25.2%  32.01;  Surgeon:  BLeandrew Koyanagi MD;  [place] [facility]  Service: Ophthalmology;  Laterality: Left;   MELANOMA EXCISION WITH SENTINEL LYMPH NODE BIOPSY     SOFT TISSUE TUMOR RESECTION  1985    Allergies: Allergies as of 09/23/2022 - Review Complete 09/23/2022  Allergen Reaction Noted   Demerol  12/05/2011   Meperidine  11/23/2014    Medications: No outpatient medications have been marked as taking for the 09/23/22 encounter (Office Visit) with YMeade Maw MD.    Social History: Social History   Tobacco Use   Smoking status: Former    Packs/day: 1.00    Years: 10.00    Total pack years: 10.00    Types: Cigarettes    Quit date: 02/22/1962    Years since quitting: 60.6   Smokeless tobacco: Never  Vaping Use   Vaping Use: Never used  Substance Use Topics   Alcohol use: Yes    Alcohol/week: 7.0 standard drinks of alcohol    Types: 7 Standard drinks or equivalent per week    Comment: Occasional wine and beer with meal    Drug use: No    Family Medical History: Family History  Problem Relation Age of Onset   Mental illness Mother    Heart failure Mother    Heart failure Father    Rheumatic fever Sister    Cancer Neg Hx    Drug abuse Neg Hx     Physical Examination: Vitals:   09/23/22 1348  BP: (!) 105/59  Pulse:  73    General: Patient is well developed, well nourished, calm, collected, and in no apparent distress. Attention to examination is appropriate.  Neck:   Supple.  Full range of motion.  Respiratory: Patient is breathing without any difficulty.   NEUROLOGICAL:     Awake, alert, oriented to person, place, and time.  Speech is clear and fluent. Fund of knowledge is appropriate.   Cranial Nerves: Pupils equal round and reactive to light.  Facial tone is symmetric.  Facial sensation is symmetric. Shoulder shrug is symmetric. Tongue protrusion is midline.  There is no pronator drift.  ROM of spine: full.    Strength: Side Biceps Triceps  Deltoid Interossei Grip Wrist Ext. Wrist Flex.  R '5 5 5 5 5 5 5  '$ L '5 5 5 5 5 5 5   '$ Side Iliopsoas Quads Hamstring PF DF EHL  R '5 5 5 5 5 5  '$ L '5 5 5 5 5 5   '$ Reflexes are 1+ and symmetric at the biceps, triceps, brachioradialis, patella and achilles.   Hoffman's is absent.   Bilateral upper and lower extremity sensation is intact to light touch.    No evidence of dysmetria noted.  Gait is normal.     Medical Decision Making  Imaging: MRI Brain 08/29/22 IMPRESSION: 10 mm dural-based mass overlying the mid right frontal lobe with possible subtle involvement of the adjacent calvarium. This may reflect a meningioma. However, given the provided history of recurrent melanoma, a short-interval (55-month follow-up brain MRI without and with contrast is recommended to ensure stability and to exclude a dural-based metastasis. No significant mass effect upon the underlying brain parenchyma or underlying parenchymal edema.   Mild chronic small vessel ischemic changes within the cerebral white matter and pons.   Mild generalized cerebral atrophy.     Electronically Signed   By: KKellie SimmeringD.O.   On: 08/29/2022 19:01  I have personally reviewed the images and agree with the above interpretation.  Assessment and Plan: Mr. NPlaskettis a pleasant 88y.o. male with a lesion of the dura that seems most consistent with a small meningioma.  There is some chance could be a dural based metastasis.  He has not had a melanoma lesion removed for many years.  I think it is unlikely to be melanoma, but have recommended a follow-up MRI scan in approximately 3 months.   I spent a total of 30 minutes in face-to-face and non-face-to-face activities related to this patient's care today.  Thank you for involving me in the care of this patient.      Jon Feagans K. YIzora RibasMD, MMason District HospitalNeurosurgery

## 2022-09-23 ENCOUNTER — Encounter: Payer: Self-pay | Admitting: Neurosurgery

## 2022-09-23 ENCOUNTER — Ambulatory Visit: Payer: Medicare PPO | Admitting: Neurosurgery

## 2022-09-23 VITALS — BP 105/59 | HR 73 | Ht 69.0 in | Wt 186.8 lb

## 2022-09-23 DIAGNOSIS — D320 Benign neoplasm of cerebral meninges: Secondary | ICD-10-CM | POA: Diagnosis not present

## 2022-09-23 DIAGNOSIS — Z9181 History of falling: Secondary | ICD-10-CM | POA: Diagnosis not present

## 2022-09-23 DIAGNOSIS — R2689 Other abnormalities of gait and mobility: Secondary | ICD-10-CM | POA: Diagnosis not present

## 2022-09-25 DIAGNOSIS — Z9181 History of falling: Secondary | ICD-10-CM | POA: Diagnosis not present

## 2022-09-25 DIAGNOSIS — R2689 Other abnormalities of gait and mobility: Secondary | ICD-10-CM | POA: Diagnosis not present

## 2022-09-26 ENCOUNTER — Ambulatory Visit (INDEPENDENT_AMBULATORY_CARE_PROVIDER_SITE_OTHER): Payer: Medicare PPO

## 2022-09-26 DIAGNOSIS — E538 Deficiency of other specified B group vitamins: Secondary | ICD-10-CM

## 2022-09-26 DIAGNOSIS — E559 Vitamin D deficiency, unspecified: Secondary | ICD-10-CM

## 2022-09-26 LAB — VITAMIN D 25 HYDROXY (VIT D DEFICIENCY, FRACTURES): VITD: 21.52 ng/mL — ABNORMAL LOW (ref 30.00–100.00)

## 2022-09-26 MED ORDER — CYANOCOBALAMIN 1000 MCG/ML IJ SOLN
1000.0000 ug | Freq: Once | INTRAMUSCULAR | Status: AC
  Administered 2022-09-26: 1000 ug via INTRAMUSCULAR

## 2022-09-26 NOTE — Progress Notes (Signed)
Pt presents today for b12 injection. Left deltoid, IM. Pt voiced no concerns nor showed any signs of distress during injection.

## 2022-09-29 LAB — INTRINSIC FACTOR ANTIBODIES: Intrinsic Factor: POSITIVE — AB

## 2022-09-30 DIAGNOSIS — Z9181 History of falling: Secondary | ICD-10-CM | POA: Diagnosis not present

## 2022-09-30 DIAGNOSIS — R2689 Other abnormalities of gait and mobility: Secondary | ICD-10-CM | POA: Diagnosis not present

## 2022-09-30 MED ORDER — ERGOCALCIFEROL 1.25 MG (50000 UT) PO CAPS: 50000.0000 [IU] | ORAL_CAPSULE | ORAL | 0 refills | Status: DC

## 2022-09-30 NOTE — Addendum Note (Signed)
Addended by: Crecencio Mc on: 09/30/2022 09:12 PM   Modules accepted: Orders

## 2022-10-02 DIAGNOSIS — Z9181 History of falling: Secondary | ICD-10-CM | POA: Diagnosis not present

## 2022-10-02 DIAGNOSIS — R2689 Other abnormalities of gait and mobility: Secondary | ICD-10-CM | POA: Diagnosis not present

## 2022-10-08 ENCOUNTER — Telehealth: Payer: Self-pay

## 2022-10-08 NOTE — Telephone Encounter (Signed)
-----   Message from Crecencio Mc, MD sent at 09/30/2022  9:11 PM EST -----  Your vitamin D is low again.   I am calling in a megadose of Vit D to take once weekly for a total of 3 months.  After you finish the weekly Vitamin D supplement, you should start taking an OTC  Vit D3 supplement 1000 units daily, and increase the dose to 2000 Ius daily from November through April. .    Your intrinisic factor antibody was positive,  So that means you will need to continue supplementing B12 with injections,  Not switch to oral.  If you would like to self administer,  The nurses can help you become comfortable doing this on your subsequent visits.  Regards,   Deborra Medina, MD

## 2022-10-08 NOTE — Telephone Encounter (Signed)
LMTCB in regards to lab results.  

## 2022-10-14 DIAGNOSIS — R2689 Other abnormalities of gait and mobility: Secondary | ICD-10-CM | POA: Diagnosis not present

## 2022-10-14 DIAGNOSIS — Z9181 History of falling: Secondary | ICD-10-CM | POA: Diagnosis not present

## 2022-10-16 DIAGNOSIS — Z9181 History of falling: Secondary | ICD-10-CM | POA: Diagnosis not present

## 2022-10-16 DIAGNOSIS — R2689 Other abnormalities of gait and mobility: Secondary | ICD-10-CM | POA: Diagnosis not present

## 2022-10-20 ENCOUNTER — Other Ambulatory Visit: Payer: Self-pay | Admitting: Internal Medicine

## 2022-10-21 DIAGNOSIS — Z9181 History of falling: Secondary | ICD-10-CM | POA: Diagnosis not present

## 2022-10-21 DIAGNOSIS — R2689 Other abnormalities of gait and mobility: Secondary | ICD-10-CM | POA: Diagnosis not present

## 2022-10-22 NOTE — Telephone Encounter (Signed)
Refilled: 09/30/2022 Last OV: 09/19/2022 Next OV: not scheduled

## 2022-10-23 DIAGNOSIS — R2689 Other abnormalities of gait and mobility: Secondary | ICD-10-CM | POA: Diagnosis not present

## 2022-10-23 DIAGNOSIS — Z9181 History of falling: Secondary | ICD-10-CM | POA: Diagnosis not present

## 2022-10-27 DIAGNOSIS — H353122 Nonexudative age-related macular degeneration, left eye, intermediate dry stage: Secondary | ICD-10-CM | POA: Diagnosis not present

## 2022-10-28 DIAGNOSIS — Z9181 History of falling: Secondary | ICD-10-CM | POA: Diagnosis not present

## 2022-10-28 DIAGNOSIS — R2689 Other abnormalities of gait and mobility: Secondary | ICD-10-CM | POA: Diagnosis not present

## 2022-10-31 NOTE — Telephone Encounter (Signed)
I have the medication for your approval.

## 2022-10-31 NOTE — Telephone Encounter (Signed)
Sydnee Cabal called from [facility] # [phone]. He stated that all the patient will need to have his B12 shots done at [facility] is to have a prescription for the vials sent to the pharmacy. The patient will then pick up the vials and one  of the nurses will give it to him. If you have any questions call Sydnee Cabal or Janci @ [phone].

## 2022-10-31 NOTE — Addendum Note (Signed)
Addended by: Adair Laundry on: 10/31/2022 04:35 PM   Modules accepted: Orders

## 2022-11-01 MED ORDER — CYANOCOBALAMIN 1000 MCG/ML IJ SOLN: INTRAMUSCULAR | 3 refills | Status: DC

## 2022-11-01 NOTE — Addendum Note (Signed)
Addended by: Crecencio Mc on: 11/01/2022 04:12 PM   Modules accepted: Orders

## 2022-11-01 NOTE — Telephone Encounter (Signed)
Thank you. Marland Kitchenrx changed to monthly injections

## 2022-11-04 ENCOUNTER — Ambulatory Visit: Payer: Medicare PPO | Admitting: Dermatology

## 2022-11-06 DIAGNOSIS — R2689 Other abnormalities of gait and mobility: Secondary | ICD-10-CM | POA: Diagnosis not present

## 2022-11-06 DIAGNOSIS — Z9181 History of falling: Secondary | ICD-10-CM | POA: Diagnosis not present

## 2022-11-25 DIAGNOSIS — R2689 Other abnormalities of gait and mobility: Secondary | ICD-10-CM | POA: Diagnosis not present

## 2022-11-25 DIAGNOSIS — Z9181 History of falling: Secondary | ICD-10-CM | POA: Diagnosis not present

## 2022-11-27 DIAGNOSIS — Z9181 History of falling: Secondary | ICD-10-CM | POA: Diagnosis not present

## 2022-11-27 DIAGNOSIS — R2689 Other abnormalities of gait and mobility: Secondary | ICD-10-CM | POA: Diagnosis not present

## 2023-01-02 ENCOUNTER — Ambulatory Visit
Admission: RE | Admit: 2023-01-02 | Discharge: 2023-01-02 | Disposition: A | Discharge status: Home / Self Care | Payer: Medicare PPO | Source: Ambulatory Visit | Attending: Neurosurgery | Admitting: Neurosurgery

## 2023-01-02 DIAGNOSIS — G319 Degenerative disease of nervous system, unspecified: Secondary | ICD-10-CM | POA: Diagnosis not present

## 2023-01-02 DIAGNOSIS — G9389 Other specified disorders of brain: Secondary | ICD-10-CM | POA: Diagnosis not present

## 2023-01-02 DIAGNOSIS — D320 Benign neoplasm of cerebral meninges: Secondary | ICD-10-CM | POA: Insufficient documentation

## 2023-01-02 MED ORDER — GADOBUTROL 1 MMOL/ML IV SOLN
8.0000 mL | Freq: Once | INTRAVENOUS | Status: AC | PRN
  Administered 2023-01-02: 8 mL via INTRAVENOUS

## 2023-01-06 ENCOUNTER — Ambulatory Visit: Payer: Medicare PPO | Admitting: Dermatology

## 2023-01-06 ENCOUNTER — Telehealth (INDEPENDENT_AMBULATORY_CARE_PROVIDER_SITE_OTHER): Payer: Medicare PPO | Admitting: Neurosurgery

## 2023-01-06 ENCOUNTER — Encounter: Payer: Self-pay | Admitting: Dermatology

## 2023-01-06 DIAGNOSIS — Z85820 Personal history of malignant melanoma of skin: Secondary | ICD-10-CM | POA: Diagnosis not present

## 2023-01-06 DIAGNOSIS — D239 Other benign neoplasm of skin, unspecified: Secondary | ICD-10-CM

## 2023-01-06 DIAGNOSIS — L853 Xerosis cutis: Secondary | ICD-10-CM

## 2023-01-06 DIAGNOSIS — Z1283 Encounter for screening for malignant neoplasm of skin: Secondary | ICD-10-CM

## 2023-01-06 DIAGNOSIS — L578 Other skin changes due to chronic exposure to nonionizing radiation: Secondary | ICD-10-CM

## 2023-01-06 DIAGNOSIS — D225 Melanocytic nevi of trunk: Secondary | ICD-10-CM

## 2023-01-06 DIAGNOSIS — L719 Rosacea, unspecified: Secondary | ICD-10-CM | POA: Diagnosis not present

## 2023-01-06 DIAGNOSIS — C44311 Basal cell carcinoma of skin of nose: Secondary | ICD-10-CM | POA: Diagnosis not present

## 2023-01-06 DIAGNOSIS — L300 Nummular dermatitis: Secondary | ICD-10-CM

## 2023-01-06 DIAGNOSIS — L814 Other melanin hyperpigmentation: Secondary | ICD-10-CM | POA: Diagnosis not present

## 2023-01-06 DIAGNOSIS — D320 Benign neoplasm of cerebral meninges: Secondary | ICD-10-CM

## 2023-01-06 DIAGNOSIS — Z85828 Personal history of other malignant neoplasm of skin: Secondary | ICD-10-CM | POA: Diagnosis not present

## 2023-01-06 DIAGNOSIS — L820 Inflamed seborrheic keratosis: Secondary | ICD-10-CM

## 2023-01-06 DIAGNOSIS — D229 Melanocytic nevi, unspecified: Secondary | ICD-10-CM

## 2023-01-06 DIAGNOSIS — L821 Other seborrheic keratosis: Secondary | ICD-10-CM

## 2023-01-06 DIAGNOSIS — D485 Neoplasm of uncertain behavior of skin: Secondary | ICD-10-CM

## 2023-01-06 MED ORDER — MOMETASONE FUROATE 0.1 % EX CREA: TOPICAL_CREAM | CUTANEOUS | 1 refills | Status: DC

## 2023-01-06 MED ORDER — METRONIDAZOLE 0.75 % EX GEL: CUTANEOUS | 11 refills | Status: DC

## 2023-01-06 NOTE — Patient Instructions (Addendum)
Start mometasone cream twice daily to affected areas at ankles, legs. Follow with moisturizer. Avoid applying to face, groin, and axilla. Use as directed. Long-term use can cause thinning of the skin.  Topical steroids (such as triamcinolone, fluocinolone, fluocinonide, mometasone, clobetasol, halobetasol, betamethasone, hydrocortisone) can cause thinning and lightening of the skin if they are used for too long in the same area. Your physician has selected the right strength medicine for your problem and area affected on the body. Please use your medication only as directed by your physician to prevent side effects.   Wound Care Instructions  Cleanse wound gently with soap and water once a day then pat dry with clean gauze. Apply a thin coat of Petrolatum (petroleum jelly, "Vaseline") over the wound (unless you have an allergy to this). We recommend that you use a new, sterile tube of Vaseline. Do not pick or remove scabs. Do not remove the yellow or white "healing tissue" from the base of the wound.  Cover the wound with fresh, clean, nonstick gauze and secure with paper tape. You may use Band-Aids in place of gauze and tape if the wound is small enough, but would recommend trimming much of the tape off as there is often too much. Sometimes Band-Aids can irritate the skin.  You should call the office for your biopsy report after 1 week if you have not already been contacted.  If you experience any problems, such as abnormal amounts of bleeding, swelling, significant bruising, significant pain, or evidence of infection, please call the office immediately.  FOR ADULT SURGERY PATIENTS: If you need something for pain relief you may take 1 extra strength Tylenol (acetaminophen) AND 2 Ibuprofen (235m each) together every 4 hours as needed for pain. (do not take these if you are allergic to them or if you have a reason you should not take them.) Typically, you may only need pain medication for 1 to 3 days.     Melanoma ABCDEs  Melanoma is the most dangerous type of skin cancer, and is the leading cause of death from skin disease.  You are more likely to develop melanoma if you: Have light-colored skin, light-colored eyes, or red or blond hair Spend a lot of time in the sun Tan regularly, either outdoors or in a tanning bed Have had blistering sunburns, especially during childhood Have a close family member who has had a melanoma Have atypical moles or large birthmarks  Early detection of melanoma is key since treatment is typically straightforward and cure rates are extremely high if we catch it early.   The first sign of melanoma is often a change in a mole or a new dark spot.  The ABCDE system is a way of remembering the signs of melanoma.  A for asymmetry:  The two halves do not match. B for border:  The edges of the growth are irregular. C for color:  A mixture of colors are present instead of an even brown color. D for diameter:  Melanomas are usually (but not always) greater than 61m- the size of a pencil eraser. E for evolution:  The spot keeps changing in size, shape, and color.  Please check your skin once per month between visits. You can use a small mirror in front and a large mirror behind you to keep an eye on the back side or your body.   If you see any new or changing lesions before your next follow-up, please call to schedule a visit.  Please  continue daily skin protection including broad spectrum sunscreen SPF 30+ to sun-exposed areas, reapplying every 2 hours as needed when you're outdoors.    Due to recent changes in healthcare laws, you may see results of your pathology and/or laboratory studies on MyChart before the doctors have had a chance to review them. We understand that in some cases there may be results that are confusing or concerning to you. Please understand that not all results are received at the same time and often the doctors may need to interpret  multiple results in order to provide you with the best plan of care or course of treatment. Therefore, we ask that you please give [place] 2 business days to thoroughly review all your results before contacting the office for clarification. Should we see a critical lab result, you will be contacted sooner.   If You Need Anything After Your Visit  If you have any questions or concerns for your doctor, please call our main line at [phone] and press option 4 to reach your doctor's medical assistant. If no one answers, please leave a voicemail as directed and we will return your call as soon as possible. Messages left after 4 pm will be answered the following business day.   You may also send [place] a message via [place]. We typically respond to MyChart messages within 1-2 business days.  For prescription refills, please ask your pharmacy to contact our office. Our fax number is [phone].  If you have an urgent issue when the clinic is closed that cannot wait until the next business day, you can page your doctor at the number below.    Please note that while we do our best to be available for urgent issues outside of office hours, we are not available 24/7.   If you have an urgent issue and are unable to reach [place], you may choose to seek medical care at your doctor's office, retail clinic, urgent care center, or emergency room.  If you have a medical emergency, please immediately call 911 or go to the emergency department.  Pager Numbers  - Dr. Nehemiah Massed: [phone]  - Dr. Laurence Ferrari: [phone]  - Dr. Nicole Kindred: [phone]  In the event of inclement weather, please call our main line at [phone] for an update on the status of any delays or closures.  Dermatology Medication Tips: Please keep the boxes that topical medications come in in order to help keep track of the instructions about where and how to use these. Pharmacies typically print the medication instructions only on the boxes and  not directly on the medication tubes.   If your medication is too expensive, please contact our office at [phone] option 4 or send [place] a message through [place].   We are unable to tell what your co-pay for medications will be in advance as this is different depending on your insurance coverage. However, we may be able to find a substitute medication at lower cost or fill out paperwork to get insurance to cover a needed medication.   If a prior authorization is required to get your medication covered by your insurance company, please allow [place] 1-2 business days to complete this process.  Drug prices often vary depending on where the prescription is filled and some pharmacies may offer cheaper prices.  The website www.goodrx.com contains coupons for medications through different pharmacies. The prices here do not account for what the cost may be with help from insurance (it may be cheaper with your insurance), but  the website can give you the price if you did not use any insurance.  - You can print the associated coupon and take it with your prescription to the pharmacy.  - You may also stop by our office during regular business hours and pick up a GoodRx coupon card.  - If you need your prescription sent electronically to a different pharmacy, notify our office through [facility] or by phone at [phone] option 4.     Si Usted Necesita Algo Despus de Su Visita  Tambin puede enviarnos un mensaje a travs de Pharmacist, community. Por lo general respondemos a los mensajes de MyChart en el transcurso de 1 a 2 das hbiles.  Para renovar recetas, por favor pida a su farmacia que se ponga en contacto con nuestra oficina. Harland Dingwall de fax es [place] [phone].  Si tiene un asunto urgente cuando la clnica est cerrada y que no puede esperar hasta el siguiente da hbil, puede llamar/localizar a su doctor(a) al nmero que aparece a continuacin.   Por favor, tenga en cuenta que aunque  hacemos todo lo posible para estar disponibles para asuntos urgentes fuera del horario de [place], no estamos disponibles las 24 horas del da, los 7 das de la [place].   Si tiene un problema urgente y no puede comunicarse con nosotros, puede optar por buscar atencin mdica  en el consultorio de su doctor(a), en una clnica privada, en un centro de atencin urgente o en una sala de emergencias.  Si tiene Engineering geologist, por favor llame inmediatamente al 911 o vaya a la sala de emergencias.  Nmeros de bper  - Dr. Nehemiah Massed: [phone]  - Dra. Moye: [phone]  - Dra. Nicole Kindred: [phone]  En caso de inclemencias del [place], por favor llame a Johnsie Kindred principal al [phone] para una actualizacin sobre el [place] de cualquier retraso o cierre.  Consejos para la medicacin en dermatologa: Por favor, guarde las cajas en las que vienen los medicamentos de uso tpico para ayudarle a seguir las instrucciones sobre dnde y cmo usarlos. Las farmacias generalmente imprimen las instrucciones del medicamento slo en las cajas y no directamente en los tubos del [place].   Si su medicamento es muy caro, por favor, pngase en contacto con Zigmund Daniel llamando al [phone] y presione la opcin 4 o envenos un mensaje a travs de Pharmacist, community.   No podemos decirle cul ser su copago por los medicamentos por adelantado ya que esto es diferente dependiendo de la cobertura de su seguro. Sin embargo, es posible que podamos encontrar un medicamento sustituto a Electrical engineer un formulario para que el seguro cubra el medicamento que se considera necesario.   Si se requiere una autorizacin previa para que su compaa de seguros [place] su medicamento, por favor permtanos de 1 a 2 das hbiles para completar este proceso.  Los precios de los medicamentos varan con frecuencia dependiendo del Environmental consultant de dnde se surte la receta y alguna farmacias pueden ofrecer precios ms  baratos.  El sitio web www.goodrx.com tiene cupones para medicamentos de Airline pilot. Los precios aqu no tienen en cuenta lo que podra costar con la ayuda del seguro (puede ser ms barato con su seguro), pero el sitio web puede darle el precio si no utiliz Research scientist (physical sciences).  - Puede imprimir el cupn correspondiente y llevarlo con su receta a la farmacia.  - Tambin puede pasar por nuestra oficina durante el horario de atencin regular y Charity fundraiser una tarjeta de cupones de  GoodRx.  - Si necesita que su receta se enve electrnicamente a una farmacia diferente, informe a nuestra oficina a travs de MyChart de [place] o por telfono llamando al [phone] y presione la opcin 4.

## 2023-01-06 NOTE — Telephone Encounter (Signed)
I called [name] to review his MRI results.  He has no change on his MRI findings.  Given the stable findings over 4 months, this is a very strong argument in favor of a benign etiology of the lesion on his MRI scan.  I do not feel that he has any cancerous lesions on this MRI scan.  Given this finding, I do not think that he needs further imaging unless he has a change in his clinical symptoms.  If he develops any worsening symptoms that could be referable to intracranial pathology, I would be happy to see him in the future or order additional MRI scans.  With his current findings, I would not recommend any serial or surveillance scans.  This visit was performed via telephone.  Patient location: home Provider location: office  I spent a total of 5 minutes non-face-to-face activities for this visit on the date of this encounter including review of current clinical condition and response to treatment.  The patient is aware of and accepts the limits of this telehealth visit.

## 2023-01-06 NOTE — Progress Notes (Signed)
Follow-Up Visit   Subjective  Jon Sherman. is a 89 y.o. male who presents for the following: Annual Exam (Hx MM, MMis, BCC. Patient has a spot at neck that gets irritated and he would like removed. Also with some dry patches around ankles and at sides that he uses Eucerin on. ) and Rosacea (Patient using metronidazole 0.75% gel.). Rash on legs is itchy.   The following portions of the chart were reviewed this encounter and updated as appropriate:       Review of Systems:  No other skin or systemic complaints except as noted in HPI or Assessment and Plan.  Objective  Well appearing patient in no apparent distress; mood and affect are within normal limits.  A full examination was performed including scalp, head, eyes, ears, nose, lips, neck, chest, axillae, abdomen, back, buttocks, bilateral upper extremities, bilateral lower extremities, hands, feet, fingers, toes, fingernails, and toenails. All findings within normal limits unless otherwise noted below.  face Mild erythema, telangiectasias at nose and cheeks  Right Spinal Upper Back 0.7 cm two toned brown macule darker medial     Right Spinal Mid Upper Back 0.4 cm brown macule  sternal notch x 1 Erythematous stuck-on, waxy papule  b/l ankles, pretibia Pink scaly patches R > L  left nasal ala 5 mm flesh papule with central crust       Assessment & Plan  Rosacea face  Chronic condition with duration or expected duration over one year. Currently well-controlled.   Rosacea is a chronic progressive skin condition usually affecting the face of adults, causing redness and/or acne bumps. It is treatable but not curable. It sometimes affects the eyes (ocular rosacea) as well. It may respond to topical and/or systemic medication and can flare with stress, sun exposure, alcohol, exercise, topical steroids (including hydrocortisone/cortisone 10) and some foods.  Daily application of broad spectrum spf 30+ sunscreen to  face is recommended to reduce flares.  Continue metronidazole 0.75% gel daily.   Related Medications metroNIDAZOLE (METROGEL) 0.75 % gel Apply a thin coat to the face QD  Nevus (2) Right Spinal Upper Back; Right Spinal Mid Upper Back  Benign-appearing. Stable compared to previous visit. Observation.  Call clinic for new or changing moles.  Recommend daily use of broad spectrum spf 30+ sunscreen to sun-exposed areas.    Inflamed seborrheic keratosis sternal notch x 1  Symptomatic, irritating, patient would like treated.   Destruction of lesion - sternal notch x 1  Destruction method: cryotherapy   Informed consent: discussed and consent obtained   Lesion destroyed using liquid nitrogen: Yes   Region frozen until ice ball extended beyond lesion: Yes   Outcome: patient tolerated procedure well with no complications   Post-procedure details: wound care instructions given   Additional details:  Prior to procedure, discussed risks of blister formation, small wound, skin dyspigmentation, or rare scar following cryotherapy. Recommend Vaseline ointment to treated areas while healing.   Nummular dermatitis b/l ankles, pretibia  Chronic and persistent condition with duration or expected duration over one year. Condition is bothersome/symptomatic for patient. Currently flared.   Start mometasone cream twice daily to affected areas at ankles, legs. Follow with moisturizer. Avoid applying to face, groin, and axilla. Use as directed. Long-term use can cause thinning of the skin.  Recommend mild soap and moisturizing cream 1-2 times daily.  Gentle skin care handout provided.    Topical steroids (such as triamcinolone, fluocinolone, fluocinonide, mometasone, clobetasol, halobetasol, betamethasone, hydrocortisone) can cause thinning and  lightening of the skin if they are used for too long in the same area. Your physician has selected the right strength medicine for your problem and area affected  on the body. Please use your medication only as directed by your physician to prevent side effects.    mometasone (ELOCON) 0.1 % cream - b/l ankles, pretibia Apply twice daily to affected areas at ankles, legs. Follow with moisturizer. Avoid applying to face, groin, and axilla. Use as directed. Long-term use can cause thinning of the skin.  Neoplasm of uncertain behavior of skin left nasal ala  Skin / nail biopsy Type of biopsy: tangential   Informed consent: discussed and consent obtained   Patient was prepped and draped in usual sterile fashion: Area prepped with alcohol. Anesthesia: the lesion was anesthetized in a standard fashion   Anesthetic:  1% lidocaine w/ epinephrine 1-100,000 buffered w/ 8.4% NaHCO3 Instrument used: flexible razor blade   Hemostasis achieved with: pressure, aluminum chloride and electrodesiccation   Outcome: patient tolerated procedure well   Post-procedure details: wound care instructions given   Post-procedure details comment:  Ointment and small bandage applied  Specimen 1 - Surgical pathology Differential Diagnosis: Sebaceous Hyperplasia r/o BCC  Check Margins: No 5 mm flesh papule with central crust  If BCC, recommend Mohs at [facility]  History of Melanoma in Situ - No evidence of recurrence today at right ear helix, MOHs 12/10/20 with Dr. Lacinda Axon, right medial knee 08/19 - Recommend regular full body skin exams - Recommend daily broad spectrum sunscreen SPF 30+ to sun-exposed areas, reapply every 2 hours as needed.  - Call if any new or changing lesions are noted between office visits  History of Melanoma - No evidence of recurrence today at right abdomen (1962), right lower cheek (0.3 mm Level II, 11/21) - Recommend regular full body skin exams - Recommend daily broad spectrum sunscreen SPF 30+ to sun-exposed areas, reapply every 2 hours as needed.  - Call if any new or changing lesions are noted between office visits   History of Basal Cell  Carcinoma of the Skin - No evidence of recurrence today at right forehead - Recommend regular full body skin exams - Recommend daily broad spectrum sunscreen SPF 30+ to sun-exposed areas, reapply every 2 hours as needed.  - Call if any new or changing lesions are noted between office visits  Lentigines - Scattered tan macules - Due to sun exposure - Benign-appearing, observe - Recommend daily broad spectrum sunscreen SPF 30+ to sun-exposed areas, reapply every 2 hours as needed. - Call for any changes  Seborrheic Keratoses - Stuck-on, waxy, tan-brown papules and/or plaques  - Benign-appearing - Discussed benign etiology and prognosis. - Observe - Call for any changes  Melanocytic Nevi - Tan-brown and/or pink-flesh-colored symmetric macules and papules - Benign appearing on exam today - Observation - Call clinic for new or changing moles - Recommend daily use of broad spectrum spf 30+ sunscreen to sun-exposed areas.   Hemangiomas - Red papules - Discussed benign nature - Observe - Call for any changes  Actinic Damage - Chronic condition, secondary to cumulative UV/sun exposure - diffuse scaly erythematous macules with underlying dyspigmentation - Recommend daily broad spectrum sunscreen SPF 30+ to sun-exposed areas, reapply every 2 hours as needed.  - Staying in the shade or wearing long sleeves, sun glasses (UVA+UVB protection) and wide brim hats (4-inch brim around the entire circumference of the hat) are also recommended for sun protection.  - Call for new or changing  lesions.  Skin cancer screening performed today.  Xerosis - diffuse xerotic patches - recommend gentle, hydrating skin care - gentle skin care handout given  Dermatofibroma - Firm pink/brown papulenodule with dimple sign - Benign appearing - Call for any changes  Return in about 6 months (around 07/07/2023) for TBSE, Hx MMis, Hx MM, Hx BCC.  Graciella Belton, RMA, am acting as scribe for Brendolyn Patty, MD .  Documentation: I have reviewed the above documentation for accuracy and completeness, and I agree with the above.  Brendolyn Patty MD

## 2023-01-12 ENCOUNTER — Telehealth: Payer: Self-pay

## 2023-01-12 DIAGNOSIS — C44311 Basal cell carcinoma of skin of nose: Secondary | ICD-10-CM

## 2023-01-12 NOTE — Telephone Encounter (Signed)
Advised pt of bx results.  Advised pt we would send referral to Dr. Lacinda Axon and they would call patient and schedule mohs appointment./sh

## 2023-01-12 NOTE — Telephone Encounter (Signed)
-----   Message from Brendolyn Patty, MD sent at 01/12/2023  9:46 AM EST ----- Skin , left nasal ala BASAL CELL CARCINOMA, NODULAR PATTERN  BCC skin cancer, needs Mohs surgery at [facility] - please call patient

## 2023-02-15 ENCOUNTER — Other Ambulatory Visit: Payer: Self-pay | Admitting: Internal Medicine

## 2023-03-13 ENCOUNTER — Other Ambulatory Visit: Payer: Self-pay | Admitting: Internal Medicine

## 2023-03-13 NOTE — Telephone Encounter (Signed)
Refilled: 10/22/2022 Last OV: 09/19/2022 Next OV: not scheduled  Last vitamin D level: 09/26/2022  21.52

## 2023-03-18 HISTORY — PX: OTHER SURGICAL HISTORY: SHX169

## 2023-03-27 ENCOUNTER — Telehealth: Payer: Self-pay | Admitting: Internal Medicine

## 2023-03-27 NOTE — Telephone Encounter (Signed)
POA and living will uploaded into Chart on 03/27/2023

## 2023-04-02 DIAGNOSIS — C44311 Basal cell carcinoma of skin of nose: Secondary | ICD-10-CM | POA: Diagnosis not present

## 2023-04-15 ENCOUNTER — Telehealth: Payer: Self-pay

## 2023-04-15 NOTE — Telephone Encounter (Signed)
Specimen tracking and history updated from MOHs progress notes and photos. aw 

## 2023-04-21 ENCOUNTER — Other Ambulatory Visit: Payer: Self-pay | Admitting: Internal Medicine

## 2023-04-21 ENCOUNTER — Encounter: Payer: Self-pay | Admitting: Internal Medicine

## 2023-04-21 DIAGNOSIS — Z66 Do not resuscitate: Secondary | ICD-10-CM

## 2023-04-21 DIAGNOSIS — Z789 Other specified health status: Secondary | ICD-10-CM | POA: Insufficient documentation

## 2023-04-23 ENCOUNTER — Other Ambulatory Visit: Payer: Self-pay | Admitting: Internal Medicine

## 2023-04-29 DIAGNOSIS — H353211 Exudative age-related macular degeneration, right eye, with active choroidal neovascularization: Secondary | ICD-10-CM | POA: Diagnosis not present

## 2023-04-29 DIAGNOSIS — H01003 Unspecified blepharitis right eye, unspecified eyelid: Secondary | ICD-10-CM | POA: Diagnosis not present

## 2023-04-29 DIAGNOSIS — H353122 Nonexudative age-related macular degeneration, left eye, intermediate dry stage: Secondary | ICD-10-CM | POA: Diagnosis not present

## 2023-05-15 ENCOUNTER — Ambulatory Visit (INDEPENDENT_AMBULATORY_CARE_PROVIDER_SITE_OTHER): Payer: Medicare PPO | Admitting: *Deleted

## 2023-05-15 VITALS — Ht 69.0 in | Wt 190.0 lb

## 2023-05-15 DIAGNOSIS — Z0000 Encounter for general adult medical examination without abnormal findings: Secondary | ICD-10-CM

## 2023-05-15 NOTE — Patient Instructions (Signed)
[name] , Thank you for taking time to come for your Medicare Wellness Visit. I appreciate your ongoing commitment to your health goals. Please review the following plan we discussed and let me know if I can assist you in the future.   These are the goals we discussed:  Goals       Maintain Healthy Lifestyle (pt-stated)      Stay active. Stay hydrated.      Patient Stated      none        This is a list of the screening recommended for you and due dates:  Health Maintenance  Topic Date Due   Zoster (Shingles) Vaccine (2 of 2) 04/08/2018   COVID-19 Vaccine (3 - Moderna risk series) 01/27/2020   DTaP/Tdap/Td vaccine (2 - Td or Tdap) 02/23/2023   Medicare Annual Wellness Visit  04/11/2023   Flu Shot  06/18/2023   Pneumonia Vaccine  Completed   HPV Vaccine  Aged Out    Advanced directives: on file  Conditions/risks identified: none  Next appointment: Follow up in one year for your annual wellness visit. 05/17/24 @ 10:15 telephone  Preventive Care 65 Years and Older, Male  Preventive care refers to lifestyle choices and visits with your health care provider that can promote health and wellness. What does preventive care include? A yearly physical exam. This is also called an annual well check. Dental exams once or twice a year. Routine eye exams. Ask your health care provider how often you should have your eyes checked. Personal lifestyle choices, including: Daily care of your teeth and gums. Regular physical activity. Eating a healthy diet. Avoiding tobacco and drug use. Limiting alcohol use. Practicing safe sex. Taking low doses of aspirin every day. Taking vitamin and mineral supplements as recommended by your health care provider. What happens during an annual well check? The services and screenings done by your health care provider during your annual well check will depend on your age, overall health, lifestyle risk factors, and family history of  disease. Counseling  Your health care provider may ask you questions about your: Alcohol use. Tobacco use. Drug use. Emotional well-being. Home and relationship well-being. Sexual activity. Eating habits. History of falls. Memory and ability to understand (cognition). Work and work Astronomer. Screening  You may have the following tests or measurements: Height, weight, and BMI. Blood pressure. Lipid and cholesterol levels. These may be checked every 5 years, or more frequently if you are over 37 years old. Skin check. Lung cancer screening. You may have this screening every year starting at age 77 if you have a 30-pack-year history of smoking and currently smoke or have quit within the past 15 years. Fecal occult blood test (FOBT) of the stool. You may have this test every year starting at age 42. Flexible sigmoidoscopy or colonoscopy. You may have a sigmoidoscopy every 5 years or a colonoscopy every 10 years starting at age 14. Prostate cancer screening. Recommendations will vary depending on your family history and other risks. Hepatitis C blood test. Hepatitis B blood test. Sexually transmitted disease (STD) testing. Diabetes screening. This is done by checking your blood sugar (glucose) after you have not eaten for a while (fasting). You may have this done every 1-3 years. Abdominal aortic aneurysm (AAA) screening. You may need this if you are a current or former smoker. Osteoporosis. You may be screened starting at age 39 if you are at high risk. Talk with your health care provider about your test  results, treatment options, and if necessary, the need for more tests. Vaccines  Your health care provider may recommend certain vaccines, such as: Influenza vaccine. This is recommended every year. Tetanus, diphtheria, and acellular pertussis (Tdap, Td) vaccine. You may need a Td booster every 10 years. Zoster vaccine. You may need this after age 69. Pneumococcal 13-valent  conjugate (PCV13) vaccine. One dose is recommended after age 35. Pneumococcal polysaccharide (PPSV23) vaccine. One dose is recommended after age 70. Talk to your health care provider about which screenings and vaccines you need and how often you need them. This information is not intended to replace advice given to you by your health care provider. Make sure you discuss any questions you have with your health care provider. Document Released: 11/30/2015 Document Revised: 07/23/2016 Document Reviewed: 09/04/2015 Elsevier Interactive Patient Education  2017 [place] Prevention in the Home Falls can cause injuries. They can happen to people of all ages. There are many things you can do to make your home safe and to help prevent falls. What can I do on the outside of my home? Regularly fix the edges of walkways and driveways and fix any cracks. Remove anything that might make you trip as you walk through a door, such as a raised step or threshold. Trim any bushes or trees on the path to your home. Use bright outdoor lighting. Clear any walking paths of anything that might make someone trip, such as rocks or tools. Regularly check to see if handrails are loose or broken. Make sure that both sides of any steps have handrails. Any raised decks and porches should have guardrails on the edges. Have any leaves, snow, or ice cleared regularly. Use sand or salt on walking paths during winter. Clean up any spills in your garage right away. This includes oil or grease spills. What can I do in the bathroom? Use night lights. Install grab bars by the toilet and in the tub and shower. Do not use towel bars as grab bars. Use non-skid mats or decals in the tub or shower. If you need to sit down in the shower, use a plastic, non-slip stool. Keep the floor dry. Clean up any water that spills on the floor as soon as it happens. Remove soap buildup in the tub or shower regularly. Attach bath mats  securely with double-sided non-slip rug tape. Do not have throw rugs and other things on the floor that can make you trip. What can I do in the bedroom? Use night lights. Make sure that you have a light by your bed that is easy to reach. Do not use any sheets or blankets that are too big for your bed. They should not hang down onto the floor. Have a firm chair that has side arms. You can use this for support while you get dressed. Do not have throw rugs and other things on the floor that can make you trip. What can I do in the kitchen? Clean up any spills right away. Avoid walking on wet floors. Keep items that you use a lot in easy-to-reach places. If you need to reach something above you, use a strong step stool that has a grab bar. Keep electrical cords out of the way. Do not use floor polish or wax that makes floors slippery. If you must use wax, use non-skid floor wax. Do not have throw rugs and other things on the floor that can make you trip. What can I do with my stairs?  Do not leave any items on the stairs. Make sure that there are handrails on both sides of the stairs and use them. Fix handrails that are broken or loose. Make sure that handrails are as long as the stairways. Check any carpeting to make sure that it is firmly attached to the stairs. Fix any carpet that is loose or worn. Avoid having throw rugs at the top or bottom of the stairs. If you do have throw rugs, attach them to the floor with carpet tape. Make sure that you have a light switch at the top of the stairs and the bottom of the stairs. If you do not have them, ask someone to add them for you. What else can I do to help prevent falls? Wear shoes that: Do not have high heels. Have rubber bottoms. Are comfortable and fit you well. Are closed at the toe. Do not wear sandals. If you use a stepladder: Make sure that it is fully opened. Do not climb a closed stepladder. Make sure that both sides of the stepladder  are locked into place. Ask someone to hold it for you, if possible. Clearly mark and make sure that you can see: Any grab bars or handrails. First and last steps. Where the edge of each step is. Use tools that help you move around (mobility aids) if they are needed. These include: Canes. Walkers. Scooters. Crutches. Turn on the lights when you go into a dark area. Replace any light bulbs as soon as they burn out. Set up your furniture so you have a clear path. Avoid moving your furniture around. If any of your floors are uneven, fix them. If there are any pets around you, be aware of where they are. Review your medicines with your doctor. Some medicines can make you feel dizzy. This can increase your chance of falling. Ask your doctor what other things that you can do to help prevent falls. This information is not intended to replace advice given to you by your health care provider. Make sure you discuss any questions you have with your health care provider. Document Released: 08/30/2009 Document Revised: 04/10/2016 Document Reviewed: 12/08/2014 Elsevier Interactive Patient Education  2017 [employer].

## 2023-05-15 NOTE — Progress Notes (Addendum)
Subjective:   Jon Sherman. is a 89 y.o. male who presents for Medicare Annual/Subsequent preventive examination.  Visit Complete: Virtual  I connected with  Jon Sherman. on 05/15/23 by a audio enabled telemedicine application and verified that I am speaking with the correct person using two identifiers.  Patient Location: Home  Provider Location: Office/Clinic  I discussed the limitations of evaluation and management by telemedicine. The patient expressed understanding and agreed to proceed.  Review of Systems     Cardiac Risk Factors include: advanced age (>41men, >17 women);Other (see comment), Risk factor comments: atherosclerosis native coronary artery     Objective:    Today's Vitals   05/15/23 1105  Weight: 190 lb (86.2 kg)  Height: 5\' 9"  (1.753 m)   Body mass index is 28.06 kg/m.     05/15/2023   11:28 AM 04/10/2022   11:55 AM 07/02/2020   12:47 PM 06/29/2019   12:10 PM 06/29/2019    9:54 AM 03/19/2018   11:39 AM 03/18/2017   11:28 AM  Advanced Directives  Does Patient Have a Medical Advance Directive? Yes Yes Yes Yes Yes Yes Yes  Type of Estate agent of [place];Living will Healthcare Power of [place];Living will Healthcare Power of [place];Living will Healthcare Power of [place];Living will Healthcare Power of [employer] of [place];Living will Healthcare Power of [place];Living will  Does patient want to make changes to medical advance directive? No - Patient declined No - Patient declined No - Patient declined No - Patient declined No - Patient declined No - Patient declined No - Patient declined  Copy of Healthcare Power of Attorney in Chart? Yes - validated most recent copy scanned in chart (See row information) No - copy requested No - copy requested No - copy requested No - copy requested No - copy requested No - copy requested    Current Medications (verified) Outpatient Encounter Medications as of 05/15/2023   Medication Sig   [employer] VITAMIN B12 1000 MCG tablet TAKE 1 TABLET BY MOUTH EVERY DAY   cyanocobalamin (VITAMIN B12) 1000 MCG/ML injection Inject 1 mL once a  month   folic acid (FOLVITE) 1 MG tablet Take 1 tablet (1 mg total) by mouth daily.   metroNIDAZOLE (METROGEL) 0.75 % gel Apply a thin coat to the face QD   Multiple Vitamins-Minerals (PRESERVISION AREDS 2) CAPS Take 1 capsule by mouth 2 (two) times daily.   Vitamin D, Ergocalciferol, (DRISDOL) 1.25 MG (50000 UNIT) CAPS capsule TAKE 1 CAPSULE BY MOUTH ONE TIME PER WEEK   ciclopirox (LOPROX) 0.77 % cream Apply topically 2 (two) times daily. (Patient not taking: Reported on 05/15/2023)   mometasone (ELOCON) 0.1 % cream Apply twice daily to affected areas at ankles, legs. Follow with moisturizer. Avoid applying to face, groin, and axilla. Use as directed. Long-term use can cause thinning of the skin. (Patient not taking: Reported on 05/15/2023)   No facility-administered encounter medications on file as of 05/15/2023.    Allergies (verified) Demerol and Meperidine   History: Past Medical History:  Diagnosis Date   Basal cell carcinoma 06/29/2018   right forehead   Basal cell carcinoma 01/06/2023   L nasal ala, MOHs completed 04/02/23   Cancer (HCC) 1965   melanoma, LNs resected   COPD (chronic obstructive pulmonary disease) (HCC)    Infectious colitis 2000   requiring hospitalization   Influenza 02/18/2018   Melanoma (HCC) 1964   right abdomen   Melanoma in situ (HCC) 10/09/2020  right ear helix, MOHs 12/10/20 with Dr. Adriana Simas   Melanoma in situ of right lower extremity (HCC) 07/05/2018   right medial knee. Excised, margins free.   Melanoma of face (HCC) 08/20/2020   right lower cheek 0.50mm, [place] Level II, Mohs with Dr. Adriana Simas 09/28/20   Rosacea    Vertigo    1 episode (2019)   Past Surgical History:  Procedure Laterality Date   basal cell removal on nose  03/2023   on nose   CATARACT EXTRACTION W/PHACO Right 06/29/2019    Procedure: CATARACT EXTRACTION PHACO AND INTRAOCULAR LENS PLACEMENT (IOC) RIGHT;  Surgeon: Lockie Mola, MD;  [facility];  Service: Ophthalmology;  Laterality: Right;   CATARACT EXTRACTION W/PHACO Left 08/31/2019   Procedure: CATARACT EXTRACTION PHACO AND INTRAOCULAR LENS PLACEMENT (IOC) LEFT 02:06.7  25.2%  32.01;  Surgeon: Lockie Mola, MD;  Location: [facility];  Service: Ophthalmology;  Laterality: Left;   MELANOMA EXCISION WITH SENTINEL LYMPH NODE BIOPSY     SOFT TISSUE TUMOR RESECTION  11/18/1983   Family History  Problem Relation Age of Onset   Mental illness Mother    Heart failure Mother    Heart failure Father    Rheumatic fever Sister    Cancer Neg Hx    Drug abuse Neg Hx    Social History   Socioeconomic History   Marital status: Married    Spouse name: Not on file   Number of children: Not on file   Years of education: Not on file   Highest education level: Not on file  Occupational History   Not on file  Tobacco Use   Smoking status: Former    Packs/day: 1.00    Years: 10.00    Additional pack years: 0.00    Total pack years: 10.00    Types: Cigarettes    Quit date: 02/22/1962    Years since quitting: 61.2   Smokeless tobacco: Never  Vaping Use   Vaping Use: Never used  Substance and Sexual Activity   Alcohol use: Yes    Alcohol/week: 7.0 standard drinks of alcohol    Types: 7 Standard drinks or equivalent per week    Comment: Occasional wine and beer with meal    Drug use: No   Sexual activity: Never  Other Topics Concern   Not on file  Social History Narrative   Married    Lives in [place] >10 years as of 02/2018    Social Determinants of Health   Financial Resource Strain: Low Risk  (05/15/2023)   Overall Financial Resource Strain (CARDIA)    Difficulty of Paying Living Expenses: Not hard at all  Food Insecurity: No Food Insecurity (05/15/2023)   Hunger Vital Sign    Worried About Running Out of Food  in the Last Year: Never true    Ran Out of Food in the Last Year: Never true  Transportation Needs: No Transportation Needs (05/15/2023)   PRAPARE - Administrator, Civil Service (Medical): No    Lack of Transportation (Non-Medical): No  Physical Activity: Insufficiently Active (05/15/2023)   Exercise Vital Sign    Days of Exercise per Week: 3 days    Minutes of Exercise per Session: 20 min  Stress: No Stress Concern Present (05/15/2023)   [employer] of Occupational Health - Occupational Stress Questionnaire    Feeling of Stress : Only a little  Social Connections: Socially Integrated (05/15/2023)   Social Connection and Isolation Panel [NHANES]  Frequency of Communication with Friends and Family: More than three times a week    Frequency of Social Gatherings with Friends and Family: More than three times a week    Attends Religious Services: More than 4 times per year    Active Member of [employer] or Organizations: No    Attends Engineer, structural: More than 4 times per year    Marital Status: Married    Tobacco Counseling Counseling given: Not Answered   Clinical Intake:  Pre-visit preparation completed: Yes  Pain : No/denies pain     BMI - recorded: 28.06 Nutritional Status: BMI 25 -29 Overweight Nutritional Risks: None Diabetes: No  How often do you need to have someone help you when you read instructions, pamphlets, or other written materials from your doctor or pharmacy?: 1 - Never  Interpreter Needed?: No  Information entered by :: R. Tanuj Mullens LPN   Activities of Daily Living    05/15/2023   11:12 AM  In your present state of health, do you have any difficulty performing the following activities:  Hearing? 0  Vision? 1  Comment has macular degeneration in right eye  Difficulty concentrating or making decisions? 1  Comment at times  Walking or climbing stairs? 0  Comment cane at times  Dressing or bathing? 0  Doing errands, shopping?  0  Preparing Food and eating ? N  Using the Toilet? N  In the past six months, have you accidently leaked urine? N  Do you have problems with loss of bowel control? N  Managing your Medications? N  Managing your Finances? N  Housekeeping or managing your Housekeeping? N    Patient Care Team: Sherlene Shams, MD as PCP - General (Internal Medicine) Sherlene Shams, MD (Internal Medicine)  Indicate any recent Medical Services you may have received from other than [facility] providers in the past year (date may be approximate).     Assessment:   This is a routine wellness examination for Brianne.  Hearing/Vision screen Hearing Screening - Comments:: No issues Vision Screening - Comments:: Glasses, had macular deg in right eye  Dietary issues and exercise activities discussed:     Goals Addressed             This Visit's Progress    Patient Stated       none       Depression Screen    05/15/2023   11:18 AM 09/19/2022    1:07 PM 08/20/2022   11:34 AM 04/10/2022   11:39 AM 11/26/2021    1:52 PM 07/02/2020   12:44 PM 06/29/2019    9:48 AM  PHQ 2/9 Scores  PHQ - 2 Score 1 2 0 0 0 0 0  PHQ- 9 Score 4 6         Fall Risk    05/15/2023   11:30 AM 09/19/2022   10:39 AM 08/20/2022   11:34 AM 04/10/2022   11:41 AM 11/26/2021    1:51 PM  Fall Risk   Falls in the past year? 0 1 1 0 0  Number falls in past yr: 0 1 1 0 0  Injury with Fall? 0 1 0  0  Risk for fall due to : No Fall Risks;Impaired vision History of fall(s) Impaired balance/gait    Follow up Falls prevention discussed;Falls evaluation completed;Education provided Falls evaluation completed Falls evaluation completed Falls evaluation completed Falls evaluation completed    MEDICARE RISK AT HOME:  Medicare Risk at Home -  05/15/23 1131     Any stairs in or around the home? No    If so, are there any without handrails? Yes    Home free of loose throw rugs in walkways, pet beds, electrical cords, etc? No   advised to  remove throw rugs or secure rugs   Adequate lighting in your home to reduce risk of falls? Yes    Life alert? No    Use of a cane, walker or w/c? Yes   at times   Grab bars in the bathroom? Yes    Shower chair or bench in shower? Yes    Elevated toilet seat or a handicapped toilet? Yes             T Cognitive Function:    03/05/2016    2:57 PM  MMSE - Mini Mental State Exam  Orientation to time 5  Orientation to Place 5  Registration 3  Attention/ Calculation 5  Recall 3  Language- name 2 objects 2  Language- repeat 1  Language- follow 3 step command 3  Language- read & follow direction 1  Write a sentence 1  Copy design 1  Total score 30        05/15/2023   11:37 AM 04/10/2022   11:55 AM 07/02/2020   12:53 PM 06/29/2019    9:52 AM 03/19/2018   11:33 AM  6CIT Screen  What Year? 0 points 0 points 0 points 0 points 0 points  What month? 0 points 0 points 0 points 0 points 0 points  What time? 0 points 0 points 0 points 0 points 0 points  Count back from 20 0 points 0 points 0 points 0 points 0 points  Months in reverse 0 points 0 points 0 points 0 points 0 points  Repeat phrase 0 points  0 points 0 points 0 points  Total Score 0 points  0 points 0 points 0 points    Immunizations Immunization History  Administered Date(s) Administered   Fluad Quad(high Dose 65+) 08/26/2019, 08/27/2020, 08/21/2021   Influenza Split 09/12/2013   Influenza-Unspecified 09/17/2012, 09/15/2014, 08/19/2022   Moderna Sars-Covid-2 Vaccination 12/02/2019, 12/30/2019   PNEUMOCOCCAL CONJUGATE-20 09/19/2022   Pneumococcal Conjugate-13 11/20/2014   Pneumococcal Polysaccharide-23 11/20/2005   Tdap 02/22/2013   Zoster Recombinat (Shingrix) 02/11/2018    TDAP status: Due, Education has been provided regarding the importance of this vaccine. Advised may receive this vaccine at local pharmacy or Health Dept. Aware to provide a copy of the vaccination record if obtained from local pharmacy or  Health Dept. Verbalized acceptance and understanding.  Flu Vaccine status: Up to date  Pneumococcal vaccine status: Up to date  Covid-19 vaccine status: Information provided on how to obtain vaccines.   Qualifies for Shingles Vaccine? Yes   Zostavax completed  patient not sure   Shingrix Completed?: No.    Education has been provided regarding the importance of this vaccine. Patient has been advised to call insurance company to determine out of pocket expense if they have not yet received this vaccine. Advised may also receive vaccine at local pharmacy or Health Dept. Verbalized acceptance and understanding. Patient is not sure why he did not get the second one  Screening Tests Health Maintenance  Topic Date Due   Zoster Vaccines- Shingrix (2 of 2) 04/08/2018   COVID-19 Vaccine (3 - Moderna risk series) 01/27/2020   DTaP/Tdap/Td (2 - Td or Tdap) 02/23/2023   Medicare Annual Wellness (AWV)  04/11/2023   INFLUENZA VACCINE  06/18/2023   Pneumonia Vaccine 68+ Years old  Completed   HPV VACCINES  Aged Out    Health Maintenance  Health Maintenance Due  Topic Date Due   Zoster Vaccines- Shingrix (2 of 2) 04/08/2018   COVID-19 Vaccine (3 - Moderna risk series) 01/27/2020   DTaP/Tdap/Td (2 - Td or Tdap) 02/23/2023   Medicare Annual Wellness (AWV)  04/11/2023    Colorectal cancer screening: No longer required.      Additional Screening:  Hepatitis C Screening: does not qualify;  Vision Screening: Recommended annual ophthalmology exams for early detection of glaucoma and other disorders of the eye. Is the patient up to date with their annual eye exam?  Yes  Who is the provider or what is the name of the office in which the patient attends annual eye exams? [facility] If pt is not established with a provider, would they like to be referred to a provider to establish care? No .   Dental Screening: Recommended annual dental exams for proper oral hygiene   Community Resource  Referral / Chronic Care Management: CRR required this visit?  No   CCM required this visit?  No     Plan:     I have personally reviewed and noted the following in the patient's chart:   Medical and social history Use of alcohol, tobacco or illicit drugs  Current medications and supplements including opioid prescriptions. Patient is not currently taking opioid prescriptions. Functional ability and status Nutritional status Physical activity Advanced directives List of other physicians Hospitalizations, surgeries, and ER visits in previous 12 months Vitals Screenings to include cognitive, depression, and falls Referrals and appointments  In addition, I have reviewed and discussed with patient certain preventive protocols, quality metrics, and best practice recommendations. A written personalized care plan for preventive services as well as general preventive health recommendations were provided to patient.     Sydell Axon, LPN   02/23/8118   After Visit Summary: (MyChart) Due to this being a telephonic visit, the after visit summary with patients personalized plan was offered to patient via MyChart   Nurse Notes: none    I have reviewed the above information and agree with above.   Duncan Dull, MD'

## 2023-05-19 DIAGNOSIS — R32 Unspecified urinary incontinence: Secondary | ICD-10-CM | POA: Diagnosis not present

## 2023-05-19 DIAGNOSIS — M810 Age-related osteoporosis without current pathological fracture: Secondary | ICD-10-CM | POA: Diagnosis not present

## 2023-05-19 DIAGNOSIS — L719 Rosacea, unspecified: Secondary | ICD-10-CM | POA: Diagnosis not present

## 2023-05-19 DIAGNOSIS — B519 Plasmodium vivax malaria without complication: Secondary | ICD-10-CM | POA: Diagnosis not present

## 2023-05-19 DIAGNOSIS — I2510 Atherosclerotic heart disease of native coronary artery without angina pectoris: Secondary | ICD-10-CM | POA: Diagnosis not present

## 2023-05-19 DIAGNOSIS — J439 Emphysema, unspecified: Secondary | ICD-10-CM | POA: Diagnosis not present

## 2023-05-19 DIAGNOSIS — E785 Hyperlipidemia, unspecified: Secondary | ICD-10-CM | POA: Diagnosis not present

## 2023-05-19 DIAGNOSIS — R269 Unspecified abnormalities of gait and mobility: Secondary | ICD-10-CM | POA: Diagnosis not present

## 2023-05-19 DIAGNOSIS — N529 Male erectile dysfunction, unspecified: Secondary | ICD-10-CM | POA: Diagnosis not present

## 2023-05-22 ENCOUNTER — Ambulatory Visit
Admission: EM | Admit: 2023-05-22 | Discharge: 2023-05-22 | Disposition: A | Discharge status: Home / Self Care | Payer: Medicare PPO | Attending: Urgent Care | Admitting: Urgent Care

## 2023-05-22 ENCOUNTER — Encounter: Payer: Self-pay | Admitting: Emergency Medicine

## 2023-05-22 DIAGNOSIS — J3489 Other specified disorders of nose and nasal sinuses: Secondary | ICD-10-CM | POA: Diagnosis not present

## 2023-05-22 MED ORDER — IPRATROPIUM BROMIDE 0.06 % NA SOLN
2.0000 | Freq: Four times a day (QID) | NASAL | 0 refills | Status: DC
Start: 2023-05-22 — End: 2023-12-04

## 2023-05-22 NOTE — Discharge Instructions (Addendum)
Use the prescribed nasal spray up to 4 times a day to relieve your runny nose and postnasal drip.  You may also try Flonase, a nasal steroid spray, or pseudoephedrine (Sudafed) if you develop nasal congestion.

## 2023-05-22 NOTE — ED Provider Notes (Signed)
Jon Sherman    CSN: [id number] Arrival date & time: 05/22/23  1259      History   Chief Complaint Chief Complaint  Patient presents with   Nasal Congestion    HPI Jon Noor. is a 89 y.o. male.   HPI  Presents to urgent care with symptoms starting yesterday.  He endorses postnasal drip and nasal drainage.  Also throat irritation.  Past Medical History:  Diagnosis Date   Basal cell carcinoma 06/29/2018   right forehead   Basal cell carcinoma 01/06/2023   L nasal ala, MOHs completed 04/02/23   Cancer (HCC) 1965   melanoma, LNs resected   COPD (chronic obstructive pulmonary disease) (HCC)    Infectious colitis 2000   requiring hospitalization   Influenza 02/18/2018   Melanoma (HCC) 1964   right abdomen   Melanoma in situ (HCC) 10/09/2020   right ear helix, MOHs 12/10/20 with Dr. Adriana Simas   Melanoma in situ of right lower extremity (HCC) 07/05/2018   right medial knee. Excised, margins free.   Melanoma of face (HCC) 08/20/2020   right lower cheek 0.103mm, Clarks Level II, Mohs with Dr. Adriana Simas 09/28/20   Rosacea    Vertigo    1 episode (2019)    Patient Active Problem List   Diagnosis Date Noted   Living will in place 04/21/2023   Durable power of attorney for healthcare 04/21/2023   Do not resuscitate status 04/21/2023   Folic acid deficiency 08/23/2022   Recurrent falls 08/20/2022   Chronic pain of left knee 11/26/2021   History of vomiting 11/26/2021   Melanoma in situ (HCC) 10/09/2020   B12 deficiency 08/28/2020   Atherosclerosis of native coronary artery 08/27/2020   Emphysema of lung (HCC) 08/27/2020   Educated about COVID-19 virus infection 04/09/2019   Melanoma in situ of right lower extremity (HCC) 07/05/2018   Vitamin D deficiency 02/27/2015   History of shingles 02/22/2014   Benign paroxysmal positional vertigo 07/25/2013   Personal history of colonic polyps 02/19/2013   Routine general medical examination at a health care facility  02/19/2013   History of melanoma    Rosacea     Past Surgical History:  Procedure Laterality Date   basal cell removal on nose  03/2023   on nose   CATARACT EXTRACTION W/PHACO Right 06/29/2019   Procedure: CATARACT EXTRACTION PHACO AND INTRAOCULAR LENS PLACEMENT (IOC) RIGHT;  Surgeon: Lockie Mola, MD;  Location: [facility];  Service: Ophthalmology;  Laterality: Right;   CATARACT EXTRACTION W/PHACO Left 08/31/2019   Procedure: CATARACT EXTRACTION PHACO AND INTRAOCULAR LENS PLACEMENT (IOC) LEFT 02:06.7  25.2%  32.01;  Surgeon: Lockie Mola, MD;  Location: [facility];  Service: Ophthalmology;  Laterality: Left;   MELANOMA EXCISION WITH SENTINEL LYMPH NODE BIOPSY     SOFT TISSUE TUMOR RESECTION  11/18/1983       Home Medications    Prior to Admission medications   Medication Sig Start Date End Date Taking? Authorizing Provider  ciclopirox (LOPROX) 0.77 % cream Apply topically 2 (two) times daily. Patient not taking: Reported on 05/15/2023 09/17/21   Willeen Niece, MD  [facility] VITAMIN B12 1000 MCG tablet TAKE 1 TABLET BY MOUTH EVERY DAY 02/17/23   Sherlene Shams, MD  cyanocobalamin (VITAMIN B12) 1000 MCG/ML injection Inject 1 mL once a  month 11/01/22   Sherlene Shams, MD  folic acid (FOLVITE) 1 MG tablet Take 1 tablet (1 mg total) by mouth daily. 08/23/22   Sherlene Shams,  MD  metroNIDAZOLE (METROGEL) 0.75 % gel Apply a thin coat to the face QD 01/06/23   Willeen Niece, MD  mometasone (ELOCON) 0.1 % cream Apply twice daily to affected areas at ankles, legs. Follow with moisturizer. Avoid applying to face, groin, and axilla. Use as directed. Long-term use can cause thinning of the skin. Patient not taking: Reported on 05/15/2023 01/06/23   Willeen Niece, MD  Multiple Vitamins-Minerals (PRESERVISION AREDS 2) CAPS Take 1 capsule by mouth 2 (two) times daily.    [provider]  Vitamin D, Ergocalciferol, (DRISDOL) 1.25 MG (50000 UNIT) CAPS capsule TAKE 1  CAPSULE BY MOUTH ONE TIME PER WEEK 04/24/23   Sherlene Shams, MD    Family History Family History  Problem Relation Age of Onset   Mental illness Mother    Heart failure Mother    Heart failure Father    Rheumatic fever Sister    Cancer Neg Hx    Drug abuse Neg Hx     Social History Social History   Tobacco Use   Smoking status: Former    Packs/day: 1.00    Years: 10.00    Additional pack years: 0.00    Total pack years: 10.00    Types: Cigarettes    Quit date: 02/22/1962    Years since quitting: 61.2   Smokeless tobacco: Never  Vaping Use   Vaping Use: Never used  Substance Use Topics   Alcohol use: Yes    Alcohol/week: 7.0 standard drinks of alcohol    Types: 7 Standard drinks or equivalent per week    Comment: Occasional wine and beer with meal    Drug use: No     Allergies   Demerol and Meperidine   Review of Systems Review of Systems   Physical Exam Triage Vital Signs ED Triage Vitals [05/22/23 1309]  Enc Vitals Group     BP (!) 163/62     Pulse Rate 78     Resp 16     Temp 99.6 F (37.6 C)     Temp Source Oral     SpO2 96 %     Weight      Height      Head Circumference      Peak Flow      Pain Score 0     Pain Loc      Pain Edu?      Excl. in GC?    No data found.  Updated Vital Signs BP (!) 163/62 (BP Location: Left Arm)   Pulse 78   Temp 99.6 F (37.6 C) (Oral)   Resp 16   SpO2 96%   Visual Acuity Right Eye Distance:   Left Eye Distance:   Bilateral Distance:    Right Eye Near:   Left Eye Near:    Bilateral Near:     Physical Exam Vitals reviewed.  Constitutional:      Appearance: Normal appearance.  HENT:     Right Ear: Tympanic membrane normal.     Left Ear: Tympanic membrane normal.     Nose: Congestion and rhinorrhea present.  Eyes:   Cardiovascular:     Rate and Rhythm: Normal rate and regular rhythm.     Pulses: Normal pulses.     Heart sounds: Normal heart sounds.  Pulmonary:     Effort: Pulmonary effort  is normal.     Breath sounds: Normal breath sounds.  Skin:    General: Skin is warm and dry.  Neurological:  General: No focal deficit present.     Mental Status: He is alert and oriented to person, place, and time.  Psychiatric:        Mood and Affect: Mood normal.        Behavior: Behavior normal.      UC Treatments / Results  Labs (all labs ordered are listed, but only abnormal results are displayed) Labs Reviewed - No data to display  EKG   Radiology No results found.  Procedures Procedures (including critical care time)  Medications Ordered in UC Medications - No data to display  Initial Impression / Assessment and Plan / UC Course  I have reviewed the triage vital signs and the nursing notes.  Pertinent labs & imaging results that were available during my care of the patient were reviewed by me and considered in my medical decision making (see chart for details).   Jon Snelgrove. is a 89 y.o. male presenting with rhinorrhea. Patient is afebrile without recent antipyretics, satting well on room air. Overall is well appearing, well hydrated, without respiratory distress. Pulmonary exam is unremarkable.  Lungs CTAB without wheezing, rhonchi, rales. RRR without murmurs, rubs, gallops.  TMs are WNL bilaterally.  There is rhinorrhea.  Reviewed relevant chart history.   Viral versus allergic rhinorrhea.  Will treat his symptoms with ipratropium nasal spray.  Otherwise recommending over-the-counter medication for symptom control.  Counseled patient on potential for adverse effects with medications prescribed/recommended today, ER and return-to-clinic precautions discussed, patient verbalized understanding and agreement with care plan.  Final Clinical Impressions(s) / UC Diagnoses   Final diagnoses:  None   Discharge Instructions   None    ED Prescriptions   None    PDMP not reviewed this encounter.   Charma Igo, [place] 05/22/23 1323

## 2023-05-22 NOTE — ED Triage Notes (Signed)
Nasal drainage, PND, throat irritation starting yesterday.

## 2023-05-24 ENCOUNTER — Encounter: Payer: Self-pay | Admitting: Internal Medicine

## 2023-05-25 ENCOUNTER — Telehealth: Payer: Self-pay

## 2023-05-25 MED ORDER — PREDNISONE 10 MG PO TABS: ORAL_TABLET | ORAL | 0 refills | Status: DC

## 2023-05-25 NOTE — Telephone Encounter (Signed)
Hello Dr Darrick Huntsman, - Today, Sunday 24 May 2023, my father Jon Sherman tested + for COVID19 using a home test kit.  It was confirmed by a [facility] EMT who also examined [name] and did not have any concerns about his lungs (so far his symptoms are respiratory-related).  No frank fever but he feels warmish and is taking Tylenol. - He is within the 5-day window to start an antiviral for COVID19; Monday 8 July is Day 5 - do you think he should be treated with an antiviral or just continue supportive care? - Urgent care prescribed nasal spray ipratropium  QID for nasal congestion, should he continue it to relieve symptoms? - respiratory symptoms started 4 July; nasal congestion mostly - 5 July went to [name] urgent care and also reported post-nasal drip and nasal drainage, and throat irritation   As of tonight (Sunday 7 July), he sounds really congested, is coughing,  and overall doesn't feel well (I've spoken to him twice today by phone).   Your suggestions and recommendations will be appreciated.  Thank you for taking care of my father, Jon Sherman

## 2023-05-25 NOTE — Telephone Encounter (Signed)
Pt daughter called stating she sent a mychart message about pt having covid

## 2023-05-25 NOTE — Telephone Encounter (Signed)
See telephone encounter.

## 2023-05-25 NOTE — Addendum Note (Signed)
Addended by: Sherlene Shams on: 05/25/2023 05:15 PM   Modules accepted: Orders

## 2023-05-25 NOTE — Telephone Encounter (Signed)
Sent as a Wellsite geologist to pt's daughter

## 2023-05-27 ENCOUNTER — Other Ambulatory Visit: Payer: Self-pay | Admitting: Internal Medicine

## 2023-05-27 ENCOUNTER — Other Ambulatory Visit: Payer: Self-pay | Admitting: Dermatology

## 2023-05-27 DIAGNOSIS — L719 Rosacea, unspecified: Secondary | ICD-10-CM

## 2023-06-29 DIAGNOSIS — H353122 Nonexudative age-related macular degeneration, left eye, intermediate dry stage: Secondary | ICD-10-CM | POA: Diagnosis not present

## 2023-06-29 DIAGNOSIS — H353211 Exudative age-related macular degeneration, right eye, with active choroidal neovascularization: Secondary | ICD-10-CM | POA: Diagnosis not present

## 2023-06-29 DIAGNOSIS — Z961 Presence of intraocular lens: Secondary | ICD-10-CM | POA: Diagnosis not present

## 2023-07-28 ENCOUNTER — Encounter: Payer: Medicare PPO | Admitting: Dermatology

## 2023-07-29 ENCOUNTER — Ambulatory Visit: Payer: Medicare PPO | Admitting: Dermatology

## 2023-07-29 VITALS — BP 147/78 | HR 72

## 2023-07-29 DIAGNOSIS — D225 Melanocytic nevi of trunk: Secondary | ICD-10-CM

## 2023-07-29 DIAGNOSIS — D1801 Hemangioma of skin and subcutaneous tissue: Secondary | ICD-10-CM

## 2023-07-29 DIAGNOSIS — L821 Other seborrheic keratosis: Secondary | ICD-10-CM | POA: Diagnosis not present

## 2023-07-29 DIAGNOSIS — B353 Tinea pedis: Secondary | ICD-10-CM | POA: Diagnosis not present

## 2023-07-29 DIAGNOSIS — Z1283 Encounter for screening for malignant neoplasm of skin: Secondary | ICD-10-CM | POA: Diagnosis not present

## 2023-07-29 DIAGNOSIS — W908XXA Exposure to other nonionizing radiation, initial encounter: Secondary | ICD-10-CM

## 2023-07-29 DIAGNOSIS — C44212 Basal cell carcinoma of skin of right ear and external auricular canal: Secondary | ICD-10-CM | POA: Diagnosis not present

## 2023-07-29 DIAGNOSIS — L814 Other melanin hyperpigmentation: Secondary | ICD-10-CM | POA: Diagnosis not present

## 2023-07-29 DIAGNOSIS — C44319 Basal cell carcinoma of skin of other parts of face: Secondary | ICD-10-CM

## 2023-07-29 DIAGNOSIS — Z86006 Personal history of melanoma in-situ: Secondary | ICD-10-CM

## 2023-07-29 DIAGNOSIS — D229 Melanocytic nevi, unspecified: Secondary | ICD-10-CM

## 2023-07-29 DIAGNOSIS — L820 Inflamed seborrheic keratosis: Secondary | ICD-10-CM | POA: Diagnosis not present

## 2023-07-29 DIAGNOSIS — L578 Other skin changes due to chronic exposure to nonionizing radiation: Secondary | ICD-10-CM

## 2023-07-29 DIAGNOSIS — Z85828 Personal history of other malignant neoplasm of skin: Secondary | ICD-10-CM

## 2023-07-29 DIAGNOSIS — Z85820 Personal history of malignant melanoma of skin: Secondary | ICD-10-CM

## 2023-07-29 DIAGNOSIS — D485 Neoplasm of uncertain behavior of skin: Secondary | ICD-10-CM

## 2023-07-29 MED ORDER — CICLOPIROX OLAMINE 0.77 % EX CREA: TOPICAL_CREAM | CUTANEOUS | 2 refills | Status: DC

## 2023-07-29 NOTE — Patient Instructions (Addendum)
Start Ciclopirox cream twice a day to feet and between toes. Prescription put on hold at [facility] if you don't have at home.   Wound Care Instructions  Cleanse wound gently with soap and water once a day then pat dry with clean gauze. Apply a thin coat of Petrolatum (petroleum jelly, "Vaseline") over the wound (unless you have an allergy to this). We recommend that you use a new, sterile tube of Vaseline. Do not pick or remove scabs. Do not remove the yellow or white "healing tissue" from the base of the wound.  Cover the wound with fresh, clean, nonstick gauze and secure with paper tape. You may use Band-Aids in place of gauze and tape if the wound is small enough, but would recommend trimming much of the tape off as there is often too much. Sometimes Band-Aids can irritate the skin.  You should call the office for your biopsy report after 1 week if you have not already been contacted.  If you experience any problems, such as abnormal amounts of bleeding, swelling, significant bruising, significant pain, or evidence of infection, please call the office immediately.  FOR ADULT SURGERY PATIENTS: If you need something for pain relief you may take 1 extra strength Tylenol (acetaminophen) AND 2 Ibuprofen (200mg  each) together every 4 hours as needed for pain. (do not take these if you are allergic to them or if you have a reason you should not take them.) Typically, you may only need pain medication for 1 to 3 days.     Due to recent changes in healthcare laws, you may see results of your pathology and/or laboratory studies on MyChart before the doctors have had a chance to review them. We understand that in some cases there may be results that are confusing or concerning to you. Please understand that not all results are received at the same time and often the doctors may need to interpret multiple results in order to provide you with the best plan of care or course of treatment. Therefore, we  ask that you please give [place] 2 business days to thoroughly review all your results before contacting the office for clarification. Should we see a critical lab result, you will be contacted sooner.   If You Need Anything After Your Visit  If you have any questions or concerns for your doctor, please call our main line at [phone] and press option 4 to reach your doctor's medical assistant. If no one answers, please leave a voicemail as directed and we will return your call as soon as possible. Messages left after 4 pm will be answered the following business day.   You may also send [place] a message via MyChart. We typically respond to MyChart messages within 1-2 business days.  For prescription refills, please ask your pharmacy to contact our office. Our fax number is [phone].  If you have an urgent issue when the clinic is closed that cannot wait until the next business day, you can page your doctor at the number below.    Please note that while we do our best to be available for urgent issues outside of office hours, we are not available 24/7.   If you have an urgent issue and are unable to reach [place], you may choose to seek medical care at your doctor's office, retail clinic, urgent care center, or emergency room.  If you have a medical emergency, please immediately call 911 or go to the emergency department.  Pager Numbers  -  Dr. Gwen Pounds: [phone]  - Dr. Roseanne Reno: [phone]  - Dr. Katrinka Blazing: [phone]   In the event of inclement weather, please call our main line at [phone] for an update on the status of any delays or closures.  Dermatology Medication Tips: Please keep the boxes that topical medications come in in order to help keep track of the instructions about where and how to use these. Pharmacies typically print the medication instructions only on the boxes and not directly on the medication tubes.   If your medication is too expensive, please contact our office  at [phone] option 4 or send [place] a message through MyChart.   We are unable to tell what your co-pay for medications will be in advance as this is different depending on your insurance coverage. However, we may be able to find a substitute medication at lower cost or fill out paperwork to get insurance to cover a needed medication.   If a prior authorization is required to get your medication covered by your insurance company, please allow [place] 1-2 business days to complete this process.  Drug prices often vary depending on where the prescription is filled and some pharmacies may offer cheaper prices.  The website www.goodrx.com contains coupons for medications through different pharmacies. The prices here do not account for what the cost may be with help from insurance (it may be cheaper with your insurance), but the website can give you the price if you did not use any insurance.  - You can print the associated coupon and take it with your prescription to the pharmacy.  - You may also stop by our office during regular business hours and pick up a GoodRx coupon card.  - If you need your prescription sent electronically to a different pharmacy, notify our office through [facility] or by phone at [phone] option 4.     Si Usted Necesita Algo Despus de Su Visita  Tambin puede enviarnos un mensaje a travs de Clinical cytogeneticist. Por lo general respondemos a los mensajes de MyChart en el transcurso de 1 a 2 das hbiles.  Para renovar recetas, por favor pida a su farmacia que se ponga en contacto con nuestra oficina. Annie Sable de fax es [place] [phone].  Si tiene un asunto urgente cuando la clnica est cerrada y que no puede esperar hasta el siguiente da hbil, puede llamar/localizar a su doctor(a) al nmero que aparece a continuacin.   Por favor, tenga en cuenta que aunque hacemos todo lo posible para estar disponibles para asuntos urgentes fuera del horario de [place], no estamos  disponibles las 24 horas del da, los 7 [address]  [address] de la [place].   Si tiene un problema urgente y no puede comunicarse con nosotros, puede optar por buscar atencin mdica  en el consultorio de su doctor(a), en una clnica privada, en un centro de atencin urgente o en una sala de emergencias.  Si tiene Engineer, drilling, por favor llame inmediatamente al 911 o vaya a la sala de emergencias.  Nmeros de bper  - Dr. Gwen Pounds: [phone]  - Dra. Roseanne Reno: [phone]  - Dr. Katrinka Blazing: [phone]   En caso de inclemencias del tiempo, por favor llame a Lacy Duverney principal al [phone] para una actualizacin sobre el [place] de cualquier retraso o cierre.  Consejos para la medicacin en dermatologa: Por favor, guarde las cajas en las que vienen los medicamentos de uso tpico para ayudarle a seguir las instrucciones sobre dnde y cmo usarlos. Las farmacias generalmente imprimen las instrucciones  del medicamento slo en las cajas y no directamente en los tubos del medicamento.   Si su medicamento es muy caro, por favor, pngase en contacto con Rolm Gala llamando al [phone] y presione la opcin 4 o envenos un mensaje a travs de Clinical cytogeneticist.   No podemos decirle cul ser su copago por los medicamentos por adelantado ya que esto es diferente dependiendo de la cobertura de su seguro. Sin embargo, es posible que podamos encontrar un medicamento sustituto a Audiological scientist un formulario para que el seguro cubra el medicamento que se considera necesario.   Si se requiere una autorizacin previa para que su compaa de seguros [place] su medicamento, por favor permtanos de 1 a 2 das hbiles para completar [address]  Los precios de los medicamentos varan con frecuencia dependiendo del Environmental consultant de dnde se surte la receta y alguna farmacias pueden ofrecer precios ms baratos.  El sitio web www.goodrx.com tiene cupones para medicamentos de Health and safety inspector. Los precios aqu no  tienen en cuenta lo que podra costar con la ayuda del seguro (puede ser ms barato con su seguro), pero el sitio web puede darle el precio si no utiliz Tourist information centre manager.  - Puede imprimir el cupn correspondiente y llevarlo con su receta a la farmacia.  - Tambin puede pasar por nuestra oficina durante el horario de atencin regular y Education officer, museum una tarjeta de cupones de GoodRx.  - Si necesita que su receta se enve electrnicamente a una farmacia diferente, informe a nuestra oficina a travs de MyChart de [place] o por telfono llamando al [phone] y presione la opcin 4.

## 2023-07-29 NOTE — Progress Notes (Signed)
Follow-Up Visit   Subjective  Jon Sherman. is a 89 y.o. male who presents for the following: Skin Cancer Screening and Full Body Skin Exam  The patient presents for Total-Body Skin Exam (TBSE) for skin cancer screening and mole check. The patient has spots, moles and lesions to be evaluated, some may be new or changing. He has a history of multiple melanoma and BCC.  He had BCC of the left nasal ala treated with Mohs by Dr Adriana Simas 04/02/2023.   The following portions of the chart were reviewed this encounter and updated as appropriate: medications, allergies, medical history  Review of Systems:  No other skin or systemic complaints except as noted in HPI or Assessment and Plan.  Objective  Well appearing patient in no apparent distress; mood and affect are within normal limits.  A full examination was performed including scalp, head, eyes, ears, nose, lips, neck, chest, axillae, abdomen, back, buttocks, bilateral upper extremities, bilateral lower extremities, hands, feet, fingers, toes, fingernails, and toenails. All findings within normal limits unless otherwise noted below.   Relevant physical exam findings are noted in the Assessment and Plan.  Right Preauricular 8.0 mm pink pearly thin papule       upper chest x 5 (5) Erythematous stuck-on, waxy papules    Assessment & Plan   SKIN CANCER SCREENING PERFORMED TODAY.  ACTINIC DAMAGE - Chronic condition, secondary to cumulative UV/sun exposure - diffuse scaly erythematous macules with underlying dyspigmentation - Recommend daily broad spectrum sunscreen SPF 30+ to sun-exposed areas, reapply every 2 hours as needed.  - Staying in the shade or wearing long sleeves, sun glasses (UVA+UVB protection) and wide brim hats (4-inch brim around the entire circumference of the hat) are also recommended for sun protection.  - Call for new or changing lesions.  LENTIGINES, SEBORRHEIC KERATOSES, HEMANGIOMAS - Benign normal skin  lesions - Benign-appearing - Call for any changes  MELANOCYTIC NEVI - Tan-brown and/or pink-flesh-colored symmetric macules and papules - Right Spinal Upper Back 0.7 cm two toned brown macule darker medial   - Right Spinal Mid Upper Back 0.4 cm brown macule - Benign appearing on exam today, stable - Observation - Call clinic for new or changing moles - Recommend daily use of broad spectrum spf 30+ sunscreen to sun-exposed areas.   HISTORY OF BASAL CELL CARCINOMA OF THE SKIN - No evidence of recurrence today, right forehead, left nasal ala (Mohs 04/02/2023) - Recommend regular full body skin exams - Recommend daily broad spectrum sunscreen SPF 30+ to sun-exposed areas, reapply every 2 hours as needed.  - Call if any new or changing lesions are noted between office visits  TINEA PEDIS Exam: Scaling and maceration web spaces and over distal and lateral soles.  Treatment Plan: Restart Ciclopirox cream Apply to feet and between toes twice a day dsp 90g 2Rf.  HISTORY OF MELANOMA IN SITU - No evidence of recurrence today at right ear helix, MOHs 12/10/20 with Dr. Adriana Simas, right medial knee 08/19  - Recommend regular full body skin exams - Recommend daily broad spectrum sunscreen SPF 30+ to sun-exposed areas, reapply every 2 hours as needed.  - Call if any new or changing lesions are noted between office visits  History of Melanoma - No evidence of recurrence today at right abdomen (1964), right lower cheek (0.3 mm Level II, 11/21) - Recommend regular full body skin exams - Recommend daily broad spectrum sunscreen SPF 30+ to sun-exposed areas, reapply every 2 hours as needed.  -  Call if any new or changing lesions are noted between office visits   Neoplasm of uncertain behavior of skin Right Preauricular  Epidermal / dermal shaving  Lesion diameter (cm):  1.5 Informed consent: discussed and consent obtained   Patient was prepped and draped in usual sterile fashion: Area prepped  with alcohol. Anesthesia: the lesion was anesthetized in a standard fashion   Anesthetic:  1% lidocaine w/ epinephrine 1-100,000 buffered w/ 8.4% NaHCO3 Instrument used: flexible razor blade   Hemostasis achieved with: pressure, aluminum chloride and electrodesiccation   Outcome: patient tolerated procedure well    Destruction of lesion  Destruction method: electrodesiccation and curettage   Informed consent: discussed and consent obtained   Curettage performed in three different directions: Yes   Electrodesiccation performed over the curetted area: Yes   Final wound size (cm):  1.5 Hemostasis achieved with:  pressure, aluminum chloride and electrodesiccation Outcome: patient tolerated procedure well with no complications   Post-procedure details: wound care instructions given   Post-procedure details comment:  Ointment and bandage applied. Additional details:  1.5 x 0.9 cm final size  Specimen 1 - Surgical pathology Differential Diagnosis: AK r/o BCC Check Margins: No EDC today  Biopsy and EDC today.  Inflamed seborrheic keratosis (5) upper chest x 5  Symptomatic, irritating, patient would like treated.  Destruction of lesion - upper chest x 5 (5)  Destruction method: cryotherapy   Informed consent: discussed and consent obtained   Lesion destroyed using liquid nitrogen: Yes   Region frozen until ice ball extended beyond lesion: Yes   Outcome: patient tolerated procedure well with no complications   Post-procedure details: wound care instructions given   Additional details:  Prior to procedure, discussed risks of blister formation, small wound, skin dyspigmentation, or rare scar following cryotherapy. Recommend Vaseline ointment to treated areas while healing.   Tinea pedis of both feet  History of basal cell carcinoma (BCC) of skin  History of melanoma  History of melanoma in situ  Nevus  Skin cancer screening  Actinic skin damage  Lentigo  Seborrheic  keratosis  Hemangioma of skin   Return in about 6 months (around 01/26/2024) for TBSE, h/o melanoma.  ICherlyn Labella, CMA, am acting as scribe for Willeen Niece, MD .   Documentation: I have reviewed the above documentation for accuracy and completeness, and I agree with the above.  Willeen Niece, MD

## 2023-07-31 LAB — SURGICAL PATHOLOGY

## 2023-08-04 ENCOUNTER — Telehealth: Payer: Self-pay

## 2023-08-04 NOTE — Telephone Encounter (Signed)
Left message for patient to call back for biopsy results.

## 2023-08-04 NOTE — Telephone Encounter (Signed)
-----   Message from Willeen Niece sent at 08/04/2023  1:40 PM EDT ----- Skin , right preauricular SUPERFICIAL AND NODULAR BASAL CELL CARCINOMA  BCC skin cancer- already treated with EDC at time of biopsy    - please call patient

## 2023-08-05 ENCOUNTER — Ambulatory Visit: Payer: Medicare PPO | Admitting: Dermatology

## 2023-08-05 VITALS — BP 143/82 | HR 76

## 2023-08-05 DIAGNOSIS — C44212 Basal cell carcinoma of skin of right ear and external auricular canal: Secondary | ICD-10-CM

## 2023-08-05 MED ORDER — MUPIROCIN 2 % EX OINT
TOPICAL_OINTMENT | CUTANEOUS | 0 refills | Status: DC
Start: 2023-08-05 — End: 2023-12-04

## 2023-08-05 NOTE — Progress Notes (Signed)
   Follow-Up Visit   Subjective  Jon Deflorio. is a 89 y.o. male who presents for the following: recheck biopsy proven BCC of the right preauricular. Area was treated with EDC at time of biopsy. He has been applying Vaseline to area once daily.    The following portions of the chart were reviewed this encounter and updated as appropriate: medications, allergies, medical history  Review of Systems:  No other skin or systemic complaints except as noted in HPI or Assessment and Plan.  Objective  Well appearing patient in no apparent distress; mood and affect are within normal limits.  A focused examination was performed of the following areas: Face  Relevant physical exam findings are noted in the Assessment and Plan.  Right Preauricular Healing biopsy and EDC site. No evidence of infection    Assessment & Plan   Basal cell carcinoma of skin of right ear and external auricular canal Right Preauricular  mupirocin ointment (BACTROBAN) 2 % Apply to affected area on face once daily with bandage changes.  Biopsy and [facility] 07/29/2023. Healing well, no evidence of infection.   Avoid shaving over area.  Continue daily wound care, cleanse with soap/water. Start mupirocin 2% ointment with daily bandage changes, keep covered.      Return as scheduled.  ICherlyn Labella, CMA, am acting as scribe for Willeen Niece, MD .   Documentation: I have reviewed the above documentation for accuracy and completeness, and I agree with the above.  Willeen Niece, MD

## 2023-08-05 NOTE — Patient Instructions (Signed)

## 2023-09-01 ENCOUNTER — Telehealth: Payer: Self-pay | Admitting: Internal Medicine

## 2023-09-01 NOTE — Telephone Encounter (Signed)
Form has been placed in quick sign folder.

## 2023-09-01 NOTE — Telephone Encounter (Signed)
Patient just walked in and said his handicap sticker is going to run out this month and he would like to get it renewed. His number is [phone]. He will pick it up when its ready.

## 2023-09-03 NOTE — Telephone Encounter (Signed)
Pt is aware that form is ready for pick up. I have placed up front in accordion folder.

## 2023-09-25 DIAGNOSIS — C44311 Basal cell carcinoma of skin of nose: Secondary | ICD-10-CM | POA: Diagnosis not present

## 2023-10-06 ENCOUNTER — Ambulatory Visit: Payer: Medicare PPO | Admitting: Internal Medicine

## 2023-10-06 ENCOUNTER — Encounter: Payer: Self-pay | Admitting: Internal Medicine

## 2023-10-06 VITALS — BP 126/58 | HR 76 | Ht 69.0 in | Wt 178.6 lb

## 2023-10-06 DIAGNOSIS — E538 Deficiency of other specified B group vitamins: Secondary | ICD-10-CM

## 2023-10-06 DIAGNOSIS — R5383 Other fatigue: Secondary | ICD-10-CM | POA: Diagnosis not present

## 2023-10-06 DIAGNOSIS — Z0000 Encounter for general adult medical examination without abnormal findings: Secondary | ICD-10-CM

## 2023-10-06 DIAGNOSIS — E785 Hyperlipidemia, unspecified: Secondary | ICD-10-CM

## 2023-10-06 DIAGNOSIS — R7301 Impaired fasting glucose: Secondary | ICD-10-CM | POA: Diagnosis not present

## 2023-10-06 DIAGNOSIS — F5102 Adjustment insomnia: Secondary | ICD-10-CM | POA: Insufficient documentation

## 2023-10-06 DIAGNOSIS — E559 Vitamin D deficiency, unspecified: Secondary | ICD-10-CM | POA: Diagnosis not present

## 2023-10-06 MED ORDER — CYANOCOBALAMIN 1000 MCG/ML IJ SOLN: INTRAMUSCULAR | 3 refills | Status: AC

## 2023-10-06 MED ORDER — TRAZODONE HCL 50 MG PO TABS: 25.0000 mg | ORAL_TABLET | Freq: Every evening | ORAL | 3 refills | Status: DC | PRN

## 2023-10-06 NOTE — Assessment & Plan Note (Signed)

## 2023-10-06 NOTE — Assessment & Plan Note (Signed)
Recurrent ,  Requiring use of Drisdol long term for the last several months,  will repeat level and transition to daily supplements if improved,

## 2023-10-06 NOTE — Assessment & Plan Note (Signed)
Aggravated by his wife's recent commitment to memory care facility.  TRIAL OF TRAZODONE

## 2023-10-06 NOTE — Progress Notes (Signed)
Patient ID: Jon Sherman., male    DOB: [birth year]  Age: 89 y.o. MRN: [id number]  The patient is here for annual preventive examination and management of other chronic and acute problems.   The risk factors are reflected in the social history.  The roster of all physicians providing medical care to patient - is listed in the Snapshot section of the chart.  Activities of daily living:  The patient is 100% independent in all ADLs: dressing, toileting, feeding as well as independent mobility  Home safety : The patient has smoke detectors in the home. They wear seatbelts.  There are no firearms at home. There is no violence in the home.   There is no risks for hepatitis, STDs or HIV. There is no   history of blood transfusion. They have no travel history to infectious disease endemic areas of the world.  The patient has seen their dentist in the last six month. They have seen their eye doctor in the last year. They admit to slight hearing difficulty with regard to whispered voices and some television programs.  They have deferred audiologic testing in the last year.  They do not  have excessive sun exposure. Discussed the need for sun protection: hats, long sleeves and use of sunscreen if there is significant sun exposure.   Diet: the importance of a healthy diet is discussed. They do have a healthy diet.  The benefits of regular aerobic exercise were discussed. She walks 4 times per week ,  20 minutes.   Depression screen: there are no signs or vegative symptoms of depression- irritability, change in appetite, anhedonia, sadness/tearfullness.  Cognitive assessment: the patient manages all their financial and personal affairs and is actively engaged. They could relate day,date,year and events; recalled 2/3 objects at 3 minutes; performed clock-face test normally.  The following portions of the patient's history were reviewed and updated as appropriate: allergies, current medications, past  family history, past medical history,  past surgical history, past social history  and problem list.  Visual acuity was not assessed per patient preference since she has regular follow up with her ophthalmologist. Hearing and body mass index were assessed and reviewed.   During the course of the visit the patient was educated and counseled about appropriate screening and preventive services including : fall prevention , diabetes screening, nutrition counseling, colorectal cancer screening, and recommended immunizations.    CC: The primary encounter diagnosis was Fatigue, unspecified type. Diagnoses of Dyslipidemia, Impaired fasting glucose, B12 deficiency, Folic acid deficiency, Vitamin D deficiency, and Routine general medical examination at a health care facility were also pertinent to this visit.   1) depression:  misses his wife Delray Alt because she has been moved to [facility] at [facility] due to dementia  and he is still in independent living . He is Having early wakings 2-3/night with nocturia ith occasional trouble returning to sleep. Appetite is diminished and he las lost weight.  But looks forward to a hearty breakfast  2) Fecal incontinence occurring with loose stools on average 2-3 times per week. Denies constipation . Happens after greasy meals (breakfasts)  Using a cane to navigate curb.   annual exam .  No bowel or sphincter issues.  Saw GI ,  Sphincter tone was normal per PA>  Occasional insomina,  No daytime hypersomnolence unless reclining or watching TV.  No joint or back pain .  No daily walk,  Lives at [facility], walks twice or three times weekly.  Some seasonal allergies,  Not enough to take meds.  Eyes water and sneezing .    MMSE performed and patient scored 28/30  History German has a past medical history of Basal cell carcinoma (06/29/2018), Basal cell carcinoma (01/06/2023), Basal cell carcinoma (07/29/2023), Cancer (HCC) (1965), COPD (chronic obstructive  pulmonary disease) (HCC), Infectious colitis (2000), Influenza (02/18/2018), Melanoma (HCC) (1964), Melanoma in situ (HCC) (10/09/2020), Melanoma in situ of right lower extremity (HCC) (07/05/2018), Melanoma of face (HCC) (08/20/2020), Rosacea, and Vertigo.   He has a past surgical history that includes Melanoma excision with sentinel lymph node dissection; Soft Tissue Tumor Resection (11/18/1983); Cataract extraction w/PHACO (Right, 06/29/2019); Cataract extraction w/PHACO (Left, 08/31/2019); and basal cell removal on nose (03/2023).   His family history includes Heart failure in his father and mother; Mental illness in his mother; Rheumatic fever in his sister.He reports that he quit smoking about 61 years ago. His smoking use included cigarettes. He started smoking about 71 years ago. He has a 10 pack-year smoking history. He has never used smokeless tobacco. He reports current alcohol use of about 7.0 standard drinks of alcohol per week. He reports that he does not use drugs.  Outpatient Medications Prior to Visit  Medication Sig Dispense Refill  . ciclopirox (LOPROX) 0.77 % cream Apply to feet and between toes twice a day. 90 g 2  . metroNIDAZOLE (METROGEL) 0.75 % gel APPLY A THIN COAT TO THE FACE EVERY DAY 45 g 11  . Multiple Vitamins-Minerals (PRESERVISION AREDS 2) CAPS Take 1 capsule by mouth 2 (two) times daily.    . Vitamin D, Ergocalciferol, (DRISDOL) 1.25 MG (50000 UNIT) CAPS capsule TAKE 1 CAPSULE BY MOUTH ONE TIME PER WEEK 12 capsule 0  . CVS VITAMIN B12 1000 MCG tablet TAKE 1 TABLET BY MOUTH EVERY DAY 90 tablet 1  . cyanocobalamin (VITAMIN B12) 1000 MCG/ML injection Inject 1 mL once a  month 3 mL 3  . folic acid (FOLVITE) 1 MG tablet Take 1 tablet (1 mg total) by mouth daily. (Patient not taking: Reported on 10/06/2023) 90 tablet 3  . ipratropium (ATROVENT) 0.06 % nasal spray Place 2 sprays into both nostrils 4 (four) times daily. (Patient not taking: Reported on 10/06/2023) 15 mL 0  .  mometasone (ELOCON) 0.1 % cream Apply twice daily to affected areas at ankles, legs. Follow with moisturizer. Avoid applying to face, groin, and axilla. Use as directed. Long-term use can cause thinning of the skin. (Patient not taking: Reported on 10/06/2023) 45 g 1  . mupirocin ointment (BACTROBAN) 2 % Apply to affected area on face once daily with bandage changes. (Patient not taking: Reported on 10/06/2023) 22 g 0   No facility-administered medications prior to visit.    Review of Systems  Patient denies headache, fevers, malaise, unintentional weight loss, skin rash, eye pain, sinus congestion and sinus pain, sore throat, dysphagia,  hemoptysis , cough, dyspnea, wheezing, chest pain, palpitations, orthopnea, edema, abdominal pain, nausea, melena, diarrhea, constipation, flank pain, dysuria, hematuria, urinary  Frequency, nocturia, numbness, tingling, seizures,  Focal weakness, Loss of consciousness,  Tremor, insomnia, depression, anxiety, and suicidal ideation.     Objective:  BP (!) 126/58   Pulse 76   Ht 5\' 9"  (1.753 m)   Wt 178 lb 9.6 oz (81 kg)   SpO2 97%   BMI 26.37 kg/m   Physical Exam Vitals reviewed.  Constitutional:      General: He is not in acute distress.    Appearance: Normal appearance. He  is normal weight. He is not ill-appearing, toxic-appearing or diaphoretic.  HENT:     Head: Normocephalic and atraumatic.     Right Ear: Tympanic membrane, ear canal and external ear normal. There is no impacted cerumen.     Left Ear: Tympanic membrane, ear canal and external ear normal. There is no impacted cerumen.     Nose: Nose normal.     Mouth/Throat:     Mouth: Mucous membranes are moist.     Pharynx: Oropharynx is clear.  Eyes:     General: No scleral icterus.       Right eye: No discharge.        Left eye: No discharge.     Conjunctiva/sclera: Conjunctivae normal.  Neck:     Thyroid: No thyromegaly.     Vascular: No carotid bruit or JVD.  Cardiovascular:      Rate and Rhythm: Normal rate and regular rhythm.     Heart sounds: Normal heart sounds.  Pulmonary:     Effort: Pulmonary effort is normal. No respiratory distress.     Breath sounds: Normal breath sounds.  Abdominal:     General: Bowel sounds are normal.     Palpations: Abdomen is soft. There is no mass.     Tenderness: There is no abdominal tenderness. There is no guarding or rebound.  Musculoskeletal:        General: Normal range of motion.     Cervical back: Normal range of motion and neck supple.  Lymphadenopathy:     Cervical: No cervical adenopathy.  Skin:    General: Skin is warm and dry.  Neurological:     General: No focal deficit present.     Mental Status: He is alert and oriented to person, place, and time. Mental status is at baseline.  Psychiatric:        Mood and Affect: Mood normal.        Behavior: Behavior normal.        Thought Content: Thought content normal.        Judgment: Judgment normal.     Assessment & Plan:  Fatigue, unspecified type -     CBC with Differential/Platelet -     Comprehensive metabolic panel  Dyslipidemia -     Lipid panel -     LDL cholesterol, direct -     TSH  Impaired fasting glucose -     Hemoglobin A1c  B12 deficiency -     B12 and Folate Panel  Folic acid deficiency -     B12 and Folate Panel  Vitamin D deficiency -     VITAMIN D 25 Hydroxy (Vit-D Deficiency, Fractures)  Routine general medical examination at a health care facility Assessment & Plan: age appropriate education and counseling updated, referrals for preventative services and immunizations addressed, dietary and smoking counseling addressed, most recent labs reviewed.  I have personally reviewed and have noted:   1) the patient's medical and social history 2) The pt's use of alcohol, tobacco, and illicit drugs 3) The patient's current medications and supplements 4) Functional ability including ADL's, fall risk, home safety risk, hearing and visual  impairment 5) Diet and physical activities 6) Evidence for depression or mood disorder 7) The patient's height, weight, and BMI have been recorded in the chart   I have made referrals, and provided counseling and education based on review of the above    Other orders -     Cyanocobalamin; Inject 1 mL once a  month  Dispense: 3 mL; Refill: 3 -     traZODone HCl; Take 0.5-1 tablets (25-50 mg total) by mouth at bedtime as needed for sleep.  Dispense: 30 tablet; Refill: 3      I provided 40 minutes of  face-to-face time during this encounter reviewing patient's current problems and past surgeries,  recent labs and imaging studies, providing counseling on the above mentioned problems , and coordination  of care .   Follow-up: No follow-ups on file.   Sherlene Shams, MD

## 2023-10-06 NOTE — Patient Instructions (Signed)
I have prescribed trazodone to help you rest at night. . It is an old antidepressant that helps primarily with [place]  Start with 1/2 tablet at bedtime  You may increase the dose to a full tablet after a week if you are still having trouble going back to sleep

## 2023-10-06 NOTE — Progress Notes (Deleted)
Subjective:  Patient ID: Jon Musca., male    DOB: 1928-03-01  Age: 89 y.o. MRN: [id number]  CC: The primary encounter diagnosis was Fatigue, unspecified type. Diagnoses of Dyslipidemia and Impaired fasting glucose were also pertinent to this visit.   HPI Jon Musca. presents for  Chief Complaint  Patient presents with   Medical Management of Chronic Issues    1) depression:  misses his wife Jon Sherman because she has been moved to [facility] at [facility] due to dementia  and he is still in independent living . He is Having early wakings 2-3/night with nocturia ith occasional trouble returning to sleep. Appetite is diminished and he las lost weight.  But looks forward to a hearty breakfast  2) Fecal incontinence occurring with loose stools on average 2-3 times per week. Denies constipation . Happens after greasy meals (breakfasts)  Using a cane to navigate curb.   Outpatient Medications Prior to Visit  Medication Sig Dispense Refill   ciclopirox (LOPROX) 0.77 % cream Apply to feet and between toes twice a day. 90 g 2   CVS VITAMIN B12 1000 MCG tablet TAKE 1 TABLET BY MOUTH EVERY DAY 90 tablet 1   cyanocobalamin (VITAMIN B12) 1000 MCG/ML injection Inject 1 mL once a  month 3 mL 3   metroNIDAZOLE (METROGEL) 0.75 % gel APPLY A THIN COAT TO THE FACE EVERY DAY 45 g 11   Multiple Vitamins-Minerals (PRESERVISION AREDS 2) CAPS Take 1 capsule by mouth 2 (two) times daily.     Vitamin D, Ergocalciferol, (DRISDOL) 1.25 MG (50000 UNIT) CAPS capsule TAKE 1 CAPSULE BY MOUTH ONE TIME PER WEEK 12 capsule 0   folic acid (FOLVITE) 1 MG tablet Take 1 tablet (1 mg total) by mouth daily. (Patient not taking: Reported on 10/06/2023) 90 tablet 3   ipratropium (ATROVENT) 0.06 % nasal spray Place 2 sprays into both nostrils 4 (four) times daily. (Patient not taking: Reported on 10/06/2023) 15 mL 0   mometasone (ELOCON) 0.1 % cream Apply twice daily to affected areas at ankles, legs. Follow  with moisturizer. Avoid applying to face, groin, and axilla. Use as directed. Long-term use can cause thinning of the skin. (Patient not taking: Reported on 10/06/2023) 45 g 1   mupirocin ointment (BACTROBAN) 2 % Apply to affected area on face once daily with bandage changes. (Patient not taking: Reported on 10/06/2023) 22 g 0   No facility-administered medications prior to visit.    Review of Systems;  Patient denies headache, fevers, malaise, unintentional weight loss, skin rash, eye pain, sinus congestion and sinus pain, sore throat, dysphagia,  hemoptysis , cough, dyspnea, wheezing, chest pain, palpitations, orthopnea, edema, abdominal pain, nausea, melena, diarrhea, constipation, flank pain, dysuria, hematuria, urinary  Frequency, nocturia, numbness, tingling, seizures,  Focal weakness, Loss of consciousness,  Tremor, insomnia, depression, anxiety, and suicidal ideation.      Objective:  BP (!) 126/58   Pulse 76   Ht 5\' 9"  (1.753 m)   Wt 178 lb 9.6 oz (81 kg)   SpO2 97%   BMI 26.37 kg/m   BP Readings from Last 3 Encounters:  10/06/23 (!) 126/58  08/05/23 (!) 143/82  07/29/23 (!) 147/78    Wt Readings from Last 3 Encounters:  10/06/23 178 lb 9.6 oz (81 kg)  05/15/23 190 lb (86.2 kg)  09/23/22 186 lb 12.8 oz (84.7 kg)    Physical Exam  No results found for: "HGBA1C"  Lab Results  Component Value Date  CREATININE 1.16 08/20/2022   CREATININE 1.23 11/26/2021   CREATININE 1.21 08/27/2020    Lab Results  Component Value Date   WBC 7.7 08/20/2022   HGB 14.4 08/20/2022   HCT 42.9 08/20/2022   PLT 211.0 08/20/2022   GLUCOSE 98 08/20/2022   CHOL 149 08/20/2022   TRIG 181.0 (H) 08/20/2022   HDL 49.10 08/20/2022   LDLDIRECT 86.0 08/20/2022   LDLCALC 64 08/20/2022   Sherman 10 08/20/2022   AST 12 08/20/2022   NA 139 08/20/2022   K 4.6 08/20/2022   CL 104 08/20/2022   CREATININE 1.16 08/20/2022   BUN 19 08/20/2022   CO2 30 08/20/2022   TSH 2.28 08/20/2022    No  results found.  Assessment & Plan:  .Fatigue, unspecified type  Dyslipidemia  Impaired fasting glucose     I provided 30 minutes of face-to-face time during this encounter reviewing patient's last visit with me, patient's  most recent visit with cardiology,  nephrology,  and neurology,  recent surgical and non surgical procedures, previous  labs and imaging studies, counseling on currently addressed issues,  and post visit ordering to diagnostics and therapeutics .   Follow-up: No follow-ups on file.   Sherlene Shams, MD

## 2023-10-07 LAB — CBC WITH DIFFERENTIAL/PLATELET
Basophils Absolute: 0 10*3/uL (ref 0.0–0.1)
Basophils Relative: 0.6 % (ref 0.0–3.0)
Eosinophils Absolute: 0.2 10*3/uL (ref 0.0–0.7)
Eosinophils Relative: 3.1 % (ref 0.0–5.0)
HCT: 43 % (ref 39.0–52.0)
Hemoglobin: 14.4 g/dL (ref 13.0–17.0)
Lymphocytes Relative: 21.8 % (ref 12.0–46.0)
Lymphs Abs: 1.2 10*3/uL (ref 0.7–4.0)
MCHC: 33.5 g/dL (ref 30.0–36.0)
MCV: 96.4 fL (ref 78.0–100.0)
Monocytes Absolute: 0.5 10*3/uL (ref 0.1–1.0)
Monocytes Relative: 8.5 % (ref 3.0–12.0)
Neutro Abs: 3.8 10*3/uL (ref 1.4–7.7)
Neutrophils Relative %: 66 % (ref 43.0–77.0)
Platelets: 233 10*3/uL (ref 150.0–400.0)
RBC: 4.46 Mil/uL (ref 4.22–5.81)
RDW: 12.8 % (ref 11.5–15.5)
WBC: 5.7 10*3/uL (ref 4.0–10.5)

## 2023-10-07 LAB — B12 AND FOLATE PANEL
Folate: 15.5 ng/mL
Vitamin B-12: 1026 pg/mL — ABNORMAL HIGH (ref 211–911)

## 2023-10-07 LAB — COMPREHENSIVE METABOLIC PANEL WITH GFR
ALT: 11 U/L (ref 0–53)
AST: 12 U/L (ref 0–37)
Albumin: 4.3 g/dL (ref 3.5–5.2)
Alkaline Phosphatase: 67 U/L (ref 39–117)
BUN: 28 mg/dL — ABNORMAL HIGH (ref 6–23)
CO2: 30 meq/L (ref 19–32)
Calcium: 9.2 mg/dL (ref 8.4–10.5)
Chloride: 104 meq/L (ref 96–112)
Creatinine, Ser: 1.21 mg/dL (ref 0.40–1.50)
GFR: 51.63 mL/min — ABNORMAL LOW
Glucose, Bld: 101 mg/dL — ABNORMAL HIGH (ref 70–99)
Potassium: 4.6 meq/L (ref 3.5–5.1)
Sodium: 140 meq/L (ref 135–145)
Total Bilirubin: 0.4 mg/dL (ref 0.2–1.2)
Total Protein: 6.1 g/dL (ref 6.0–8.3)

## 2023-10-07 LAB — LIPID PANEL
Cholesterol: 162 mg/dL (ref 0–200)
HDL: 53.4 mg/dL (ref >39.00)
LDL Cholesterol: 85 mg/dL (ref 0–99)
NonHDL: 108.96
Total CHOL/HDL Ratio: 3
Triglycerides: 119 mg/dL (ref 0.0–149.0)
VLDL: 23.8 mg/dL (ref 0.0–40.0)

## 2023-10-07 LAB — TSH: TSH: 1.79 u[IU]/mL (ref 0.35–5.50)

## 2023-10-07 LAB — VITAMIN D 25 HYDROXY (VIT D DEFICIENCY, FRACTURES): VITD: 34.69 ng/mL (ref 30.00–100.00)

## 2023-10-07 LAB — LDL CHOLESTEROL, DIRECT: Direct LDL: 88 mg/dL

## 2023-10-07 LAB — HEMOGLOBIN A1C: Hgb A1c MFr Bld: 5.6 % (ref 4.6–6.5)

## 2023-10-29 ENCOUNTER — Other Ambulatory Visit: Payer: Self-pay | Admitting: Internal Medicine

## 2023-11-29 IMAGING — DX DG TIBIA/FIBULA 2V*L*
2 series · 2 of 2 positions shown · non-contrast
Comparison: None.

CLINICAL DATA: Left knee and anterior shin pain 2-3 days. Injury
history unknown.

EXAM:
LEFT KNEE - 3 VIEW; LEFT TIBIA AND FIBULA - 2 VIEW

[lower leg ap]
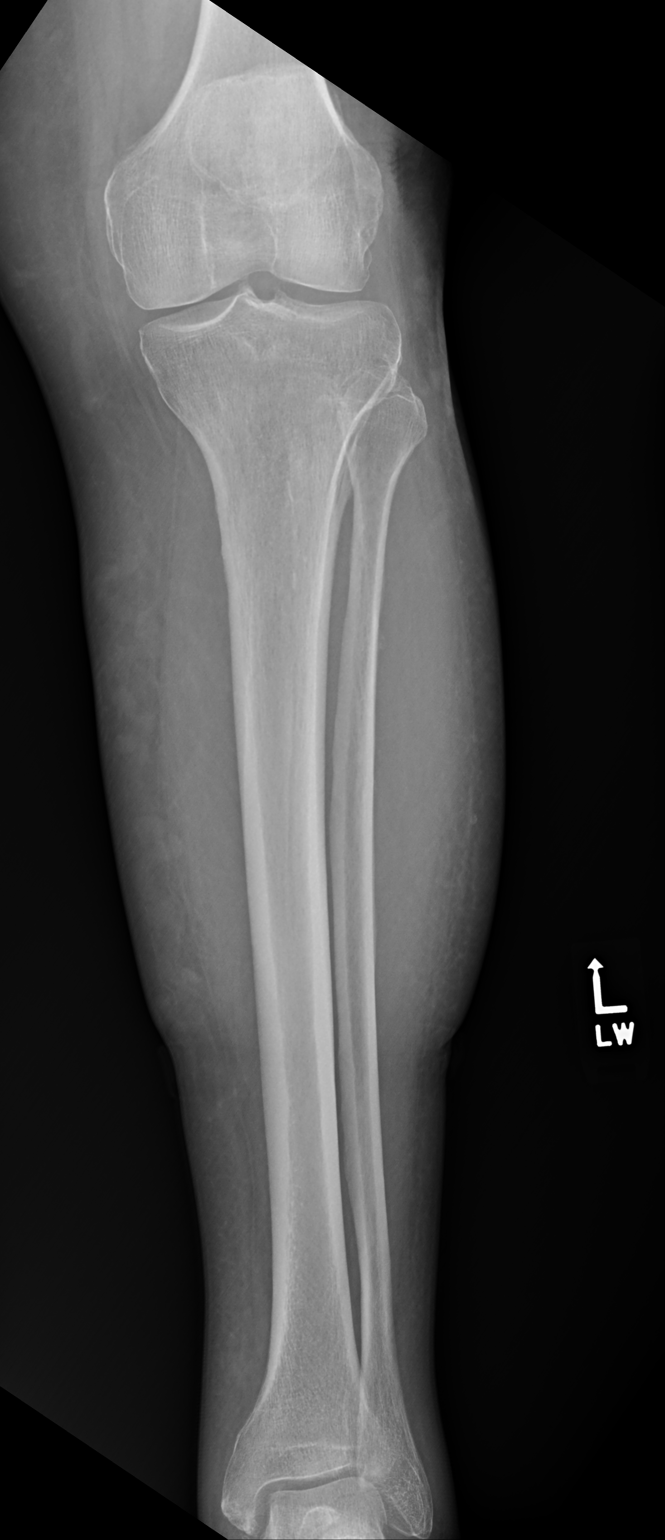

[lower leg lat]
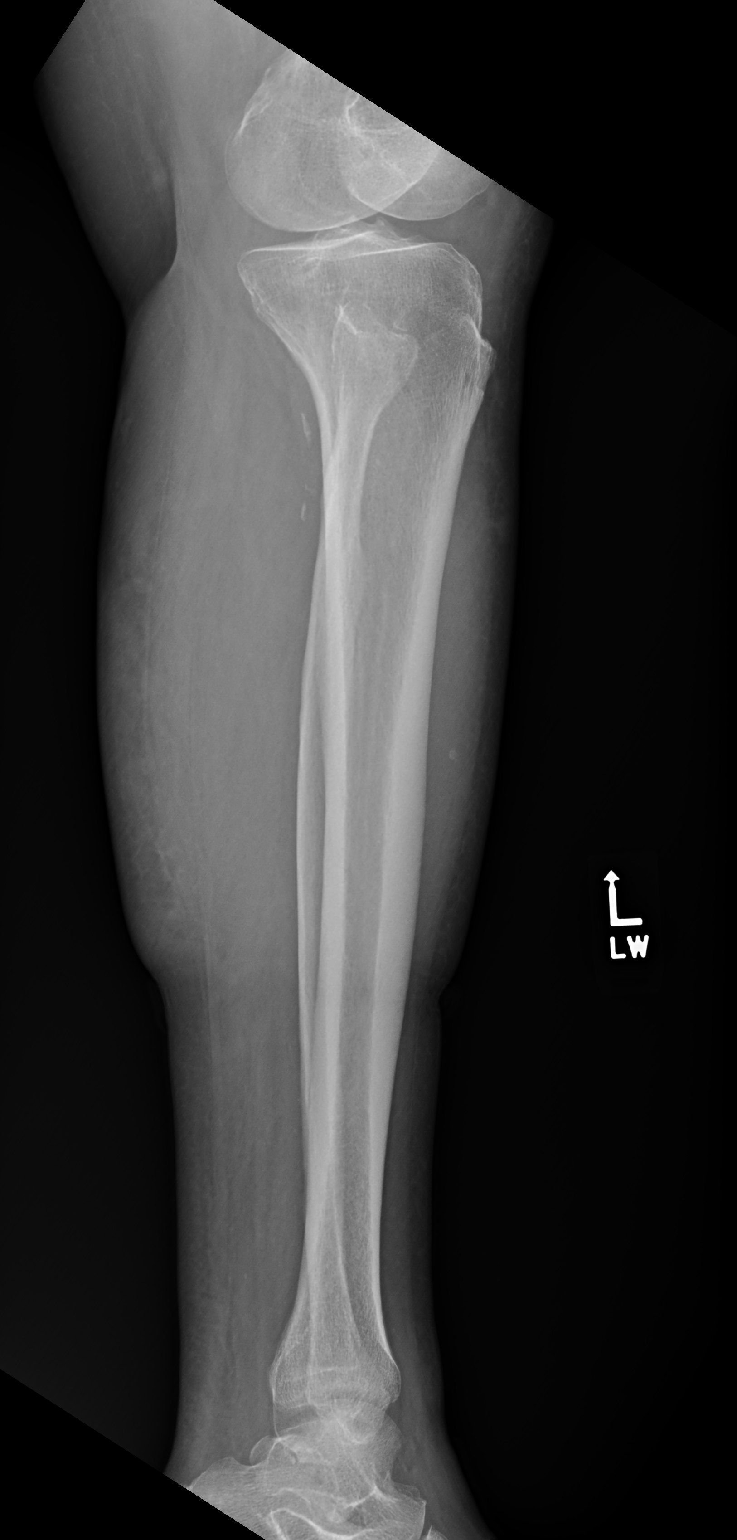

[2 of 2 positions shown; findings below may reference images not displayed]

FINDINGS: Left knee:

There is mild osteopenia without evidence of fracture. There is a
small suprapatellar bursal effusion. Mild narrowing and trace
spurring of the patellofemoral and the and medial joint compartments
is seen with otherwise good preservation of the joint space
laterally and no evidence of erosive arthropathy, with trace
marginal spurring at the patellofemoral and medial compartment
edges.

There is no erosive arthropathy. Surrounding soft tissues are
unremarkable except for likely varicose veins in the anterior distal
thigh.

Left tibia fibula:

There is mild osteopenia without evidence of fractures. There is
mild edema in the foreleg and ankle. Calcifications are noted in the
upper popliteal trifurcation arteries but no radiopaque foreign body
or soft tissue gas identified. Probable varicose veins medial calf
region.
IMPRESSION: Osteopenia, degenerative changes and small suprapatellar bursal
effusion, without evidence of fractures. Venous varicosities in the
anterior distal thigh and medial left calf region.

## 2023-11-29 IMAGING — DX DG KNEE 3 VIEWS*L*
5 series · 5 of 5 positions shown · non-contrast
Comparison: None.

CLINICAL DATA: Left knee and anterior shin pain 2-3 days. Injury
history unknown.

EXAM:
LEFT KNEE - 3 VIEW; LEFT TIBIA AND FIBULA - 2 VIEW

[knee standing ap]
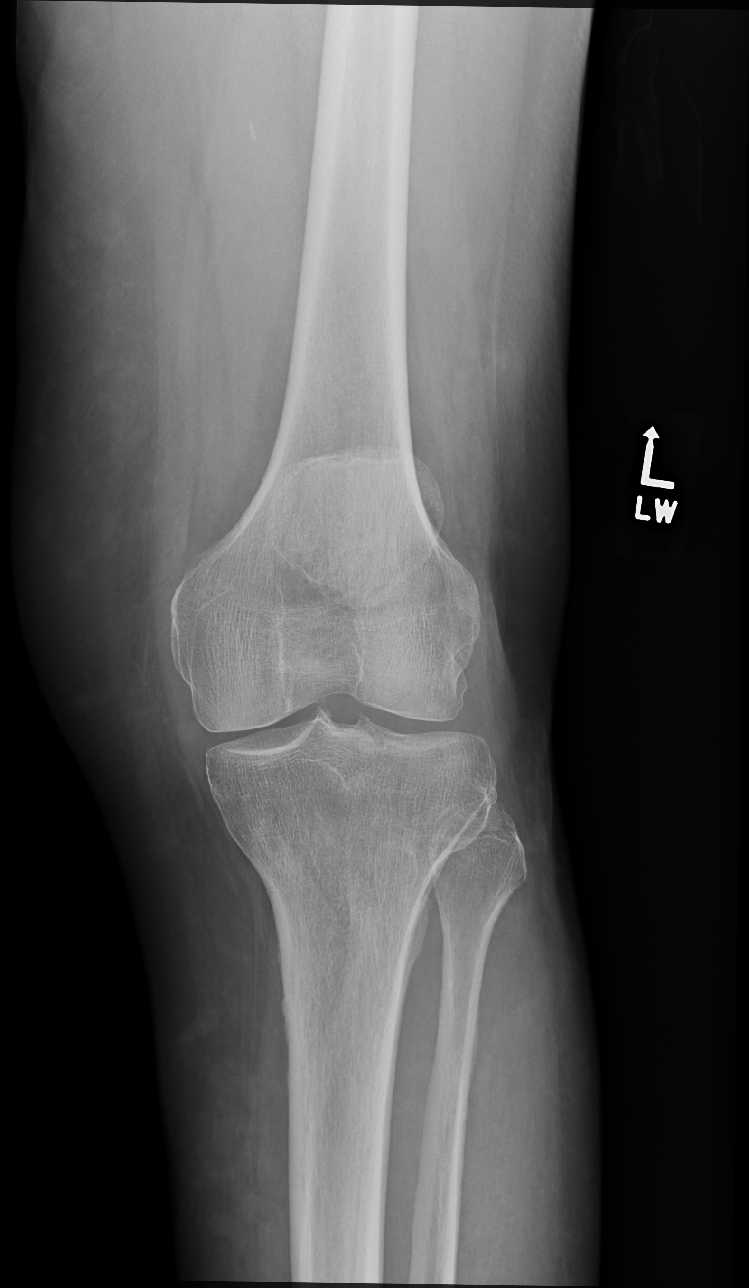

[knee standing external ap]
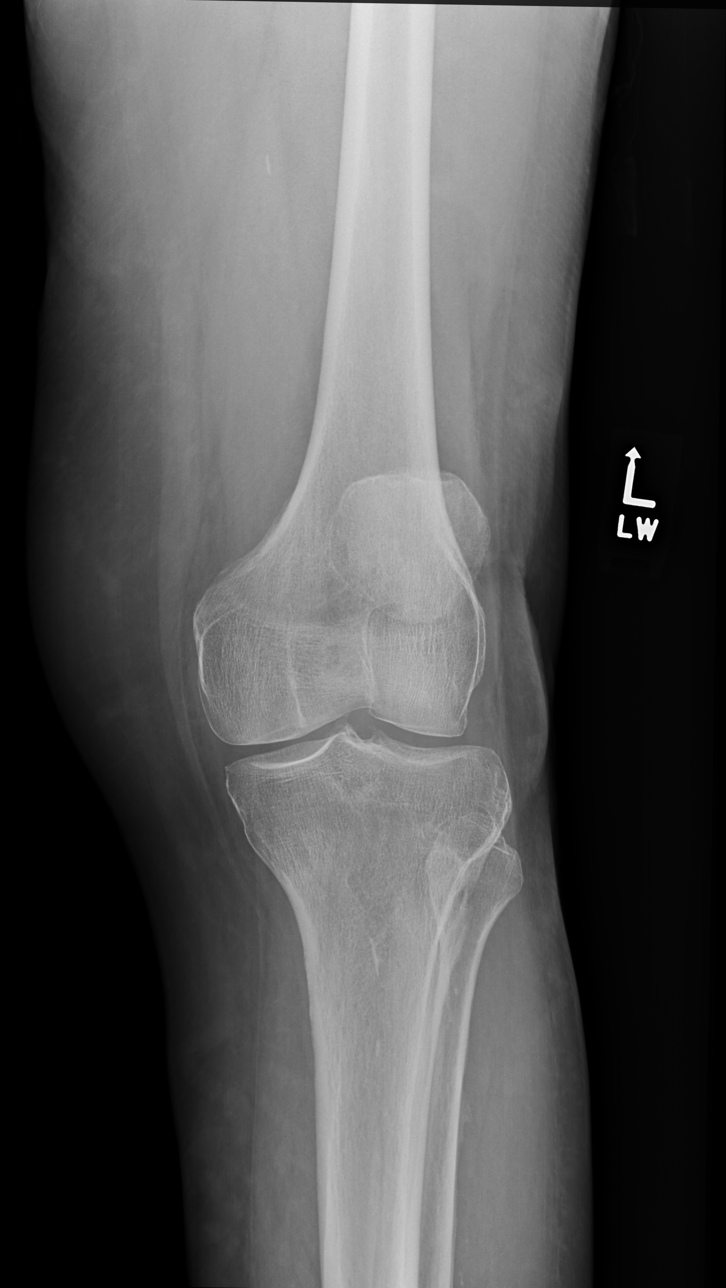

[knee standing internal ap]
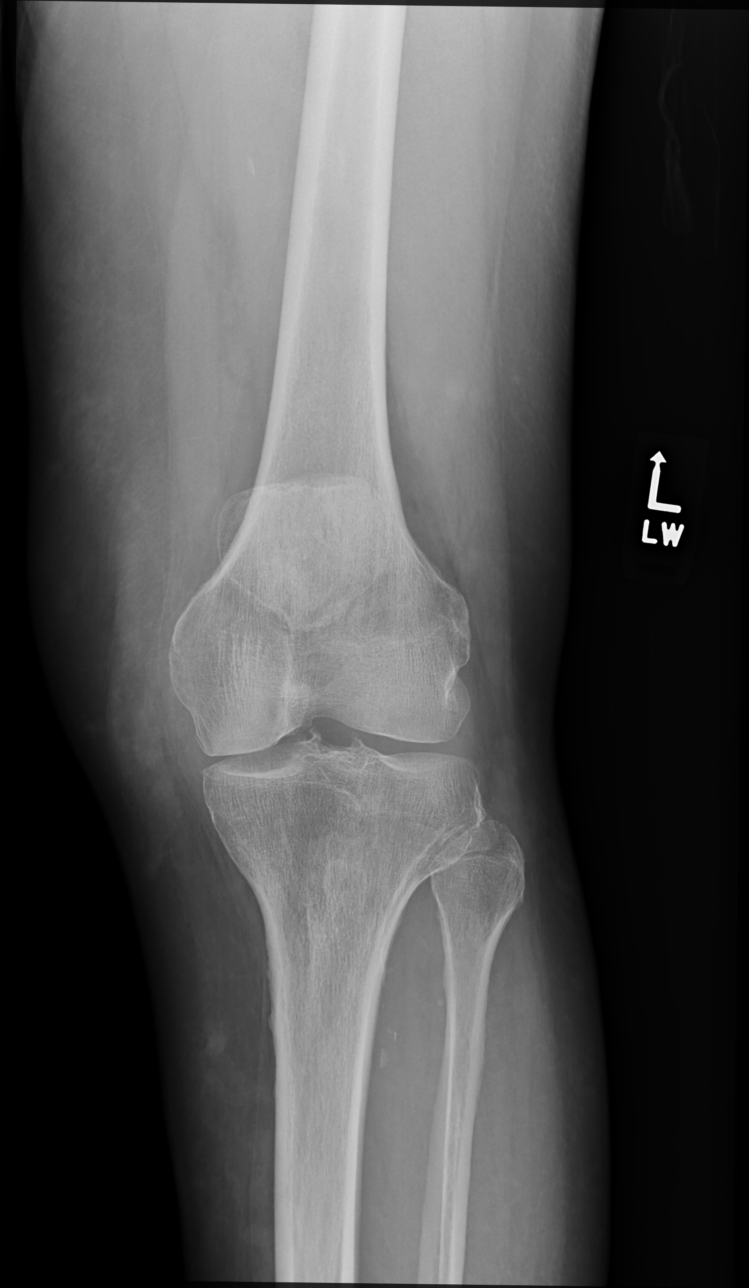

[knee standing lat]
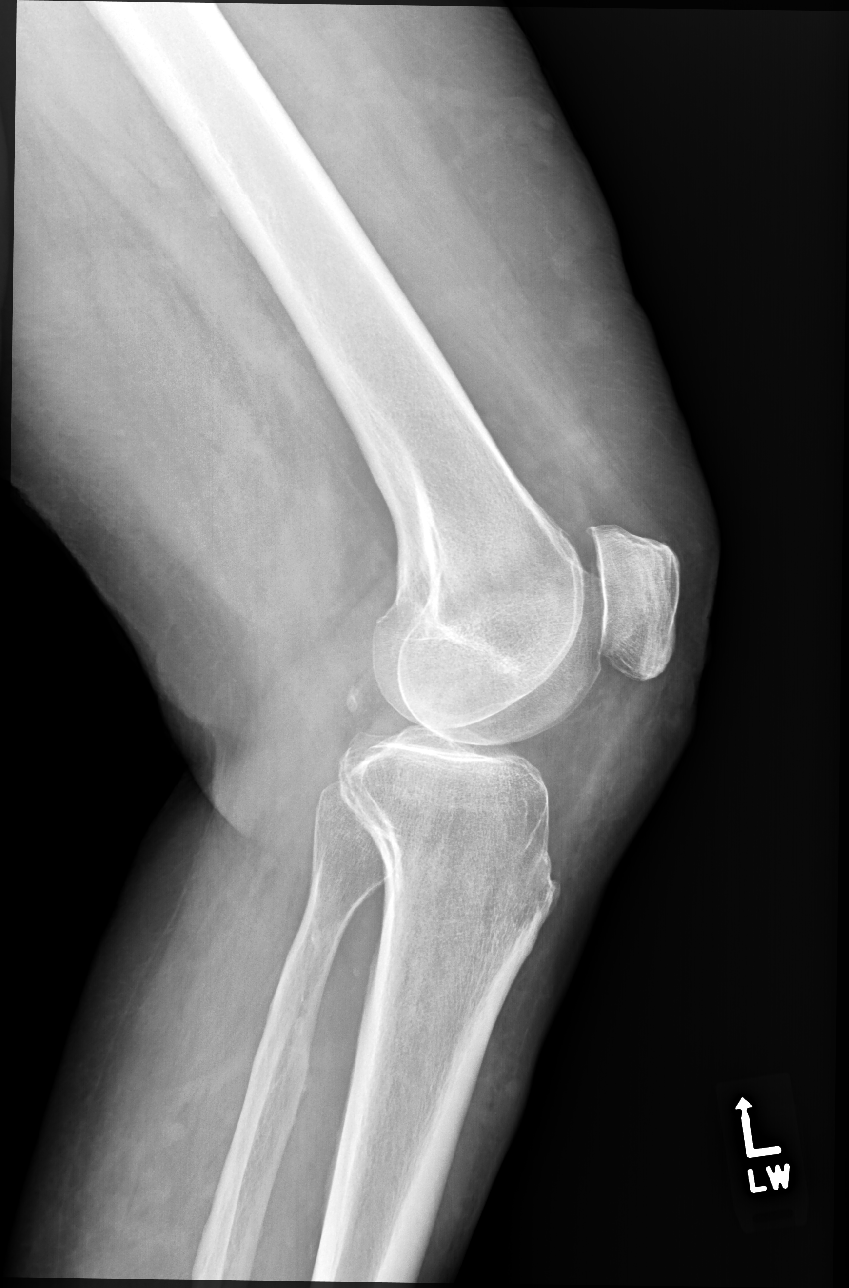

[knee [person_name] view pa]
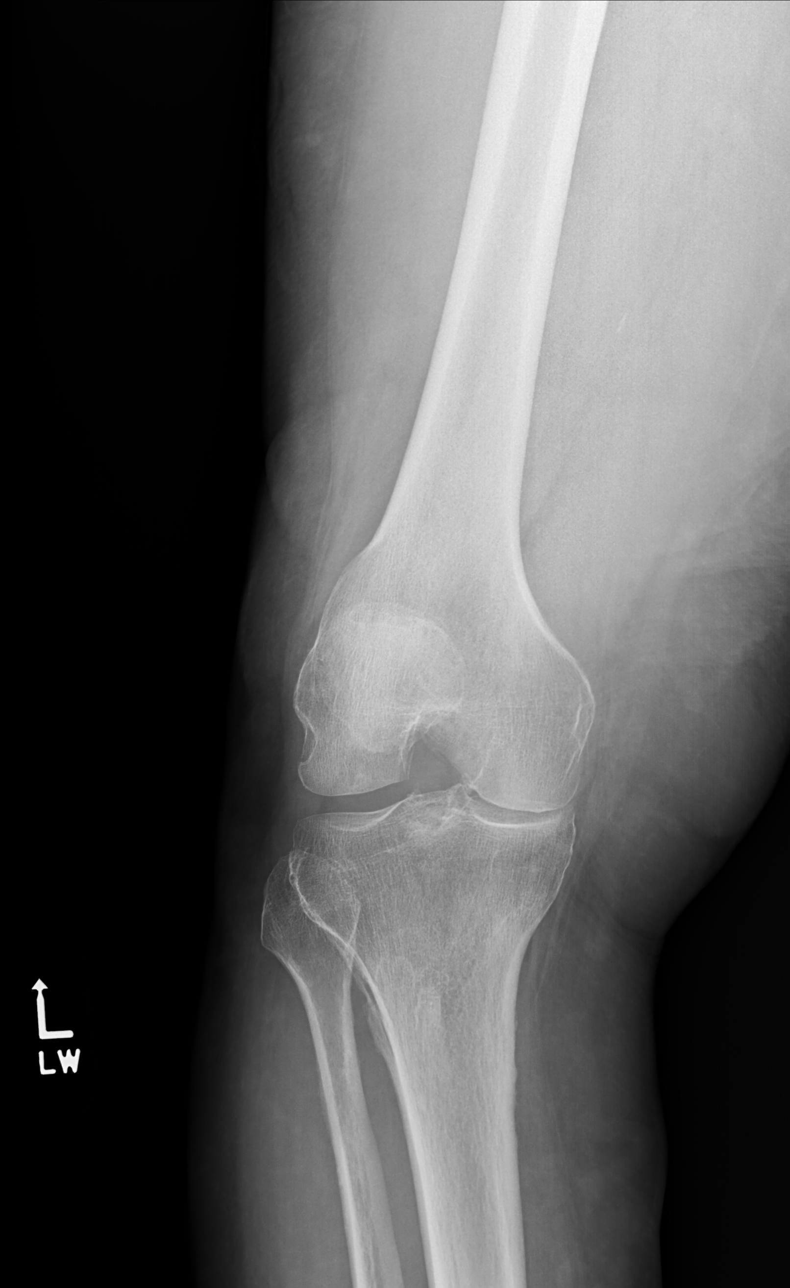

[5 of 5 positions shown; findings below may reference images not displayed]

FINDINGS: Left knee:

There is mild osteopenia without evidence of fracture. There is a
small suprapatellar bursal effusion. Mild narrowing and trace
spurring of the patellofemoral and the and medial joint compartments
is seen with otherwise good preservation of the joint space
laterally and no evidence of erosive arthropathy, with trace
marginal spurring at the patellofemoral and medial compartment
edges.

There is no erosive arthropathy. Surrounding soft tissues are
unremarkable except for likely varicose veins in the anterior distal
thigh.

Left tibia fibula:

There is mild osteopenia without evidence of fractures. There is
mild edema in the foreleg and ankle. Calcifications are noted in the
upper popliteal trifurcation arteries but no radiopaque foreign body
or soft tissue gas identified. Probable varicose veins medial calf
region.
IMPRESSION: Osteopenia, degenerative changes and small suprapatellar bursal
effusion, without evidence of fractures. Venous varicosities in the
anterior distal thigh and medial left calf region.

## 2023-12-04 ENCOUNTER — Ambulatory Visit: Payer: Medicare PPO | Admitting: Family Medicine

## 2023-12-04 ENCOUNTER — Encounter: Payer: Self-pay | Admitting: Family Medicine

## 2023-12-04 VITALS — BP 132/74 | HR 80 | Temp 97.2°F | Ht 69.0 in | Wt 181.6 lb

## 2023-12-04 DIAGNOSIS — J069 Acute upper respiratory infection, unspecified: Secondary | ICD-10-CM | POA: Insufficient documentation

## 2023-12-04 DIAGNOSIS — R051 Acute cough: Secondary | ICD-10-CM | POA: Diagnosis not present

## 2023-12-04 LAB — POCT INFLUENZA A/B
Influenza A, POC: NEGATIVE
Influenza B, POC: NEGATIVE

## 2023-12-04 NOTE — Progress Notes (Signed)
Jon Alar, MD Phone: [phone]  Jon Sherman. is a 89 y.o. male who presents today for same day visit.   Cough: Patient notes onset of symptoms 3 days ago.  They have gradually worsened.  He has been blowing clear mucus out of his nose.  Coughing up some phlegm.  He does have a lack of energy.  No fever, shortness of breath, postnasal drip, sore throat, loss of taste or smell.  He notes a negative COVID test this morning.  Social History   Tobacco Use  Smoking Status Former   Current packs/day: 0.00   Average packs/day: 1 pack/day for 10.0 years (10.0 ttl pk-yrs)   Types: Cigarettes   Start date: 02/23/1952   Quit date: 02/22/1962   Years since quitting: 61.8  Smokeless Tobacco Never    Current Outpatient Medications on File Prior to Visit  Medication Sig Dispense Refill   ciclopirox (LOPROX) 0.77 % cream Apply to feet and between toes twice a day. 90 g 2   cyanocobalamin (VITAMIN B12) 1000 MCG/ML injection Inject 1 mL once a  month 3 mL 3   metroNIDAZOLE (METROGEL) 0.75 % gel APPLY A THIN COAT TO THE FACE EVERY DAY 45 g 11   mometasone (ELOCON) 0.1 % cream Apply twice daily to affected areas at ankles, legs. Follow with moisturizer. Avoid applying to face, groin, and axilla. Use as directed. Long-term use can cause thinning of the skin. 45 g 1   Multiple Vitamins-Minerals (PRESERVISION AREDS 2) CAPS Take 1 capsule by mouth 2 (two) times daily.     traZODone (DESYREL) 50 MG tablet TAKE 0.5-1 TABLETS BY MOUTH AT BEDTIME AS NEEDED FOR SLEEP. 90 tablet 1   Vitamin D, Ergocalciferol, (DRISDOL) 1.25 MG (50000 UNIT) CAPS capsule TAKE 1 CAPSULE BY MOUTH ONE TIME PER WEEK 12 capsule 0   No current facility-administered medications on file prior to visit.     ROS see history of present illness  Objective  Physical Exam Vitals:   12/04/23 1505  BP: 132/74  Pulse: 80  Temp: (!) 97.2 F (36.2 C)  SpO2: 95%    BP Readings from Last 3 Encounters:  12/04/23 132/74   10/06/23 (!) 126/58  08/05/23 (!) 143/82   Wt Readings from Last 3 Encounters:  12/04/23 181 lb 9.6 oz (82.4 kg)  10/06/23 178 lb 9.6 oz (81 kg)  05/15/23 190 lb (86.2 kg)    Physical Exam Constitutional:      General: He is not in acute distress.    Appearance: He is not diaphoretic.  HENT:     Mouth/Throat:     Mouth: Mucous membranes are moist.     Pharynx: Oropharynx is clear.  Cardiovascular:     Rate and Rhythm: Normal rate and regular rhythm.     Heart sounds: Normal heart sounds.  Pulmonary:     Effort: Pulmonary effort is normal.     Breath sounds: Normal breath sounds.  Lymphadenopathy:     Cervical: No cervical adenopathy.  Skin:    General: Skin is warm and dry.  Neurological:     Mental Status: He is alert.      Assessment/Plan: Please see individual problem list.  Viral upper respiratory illness Assessment & Plan: Patient symptoms are most consistent with viral upper respiratory illness.  Negative flu testing today.  Discussed supportive care with Tylenol over-the-counter for any aches and pains.  Discussed he could continue Mucinex.  Patient has been using Robitussin over-the-counter with good benefit for his  cough.  If he has worsening symptoms or if his symptoms are not improving he will be reevaluated.   Acute cough -     POCT Influenza A/B    Return if symptoms worsen or fail to improve.   Jon Alar, MD [facility]

## 2023-12-04 NOTE — Patient Instructions (Signed)
Nice to see you. You likely have a cold.  You can continue the Mucinex and Robitussin.  You can use Tylenol for any discomfort.  If you have any worsening symptoms please get reevaluated.  If not improving by middle of next week please let us know.

## 2023-12-04 NOTE — Assessment & Plan Note (Signed)
Patient symptoms are most consistent with viral upper respiratory illness.  Negative flu testing today.  Discussed supportive care with Tylenol over-the-counter for any aches and pains.  Discussed he could continue Mucinex.  Patient has been using Robitussin over-the-counter with good benefit for his cough.  If he has worsening symptoms or if his symptoms are not improving he will be reevaluated.

## 2023-12-07 ENCOUNTER — Ambulatory Visit (INDEPENDENT_AMBULATORY_CARE_PROVIDER_SITE_OTHER): Payer: Medicare PPO

## 2023-12-07 ENCOUNTER — Ambulatory Visit: Payer: Self-pay | Admitting: Internal Medicine

## 2023-12-07 ENCOUNTER — Ambulatory Visit
Admission: EM | Admit: 2023-12-07 | Discharge: 2023-12-07 | Disposition: A | Discharge status: Home / Self Care | Payer: Medicare PPO | Attending: Emergency Medicine | Admitting: Emergency Medicine

## 2023-12-07 DIAGNOSIS — J441 Chronic obstructive pulmonary disease with (acute) exacerbation: Secondary | ICD-10-CM | POA: Diagnosis not present

## 2023-12-07 DIAGNOSIS — R918 Other nonspecific abnormal finding of lung field: Secondary | ICD-10-CM | POA: Diagnosis not present

## 2023-12-07 DIAGNOSIS — R058 Other specified cough: Secondary | ICD-10-CM

## 2023-12-07 DIAGNOSIS — R059 Cough, unspecified: Secondary | ICD-10-CM | POA: Diagnosis not present

## 2023-12-07 MED ORDER — DOXYCYCLINE HYCLATE 100 MG PO CAPS: 100.0000 mg | ORAL_CAPSULE | Freq: Two times a day (BID) | ORAL | 0 refills | Status: AC

## 2023-12-07 NOTE — Telephone Encounter (Signed)
Copied from CRM [id number]. Topic: Clinical - Red Word Triage >> Dec 07, 2023 11:42 AM Fuller Mandril wrote: Red Word that prompted transfer to Nurse Triage: Fluid lungs crackling when breathing. Previously seen worsening. Currently at [facility] they suggested he need xray.   Chief Complaint: Patient seen at a flu clinic and was told he has crackles in his lungs  Disposition: [] ED /[x] Urgent Care (no appt availability in office) / [] Appointment(In office/virtual)/ []  [facility]/ [] Home Care/ [] Refused Recommended Disposition /[] [facility]/ []  Follow-up with PCP Additional Notes: Patient's daughter called stating that the patient was recently seen an diagnosed with a viral URI. She states that today while at a flu clinic an EMT listened to his lungs and heard crackles and advised them to get an x-ray to rule out pneumonia. Advised patient's daughter that there are no appointments available today but that I could get him into another office that has imagine available. She states that she would instead take him to urgent care closer to home.     Reason for Disposition  [1] Recent medical visit within 24 hours AND [2] condition / symptoms WORSE    Outside 24 hours  Answer Assessment - Initial Assessment Questions 1. MAIN CONCERN OR SYMPTOM:  "What is your main concern right now?" "What question do you have?" "What's the main symptom you're worried about?" (e.g., breathing difficulty, cough, fever. pain)     Patient seen at flu clinic and told my EMT he has crackles in his lungs and to get an x-ray to rule out pneumonia  2. ONSET: "When did the cough start?"     Last week 3. BETTER-SAME-WORSE: "Are you getting better, staying the same, or getting worse compared to how you felt at your last visit to the doctor (most recent medical visit)?"     Worse 4. VISIT DATE: "When were you seen?" (Date)     12/04/23 5. VISIT DOCTOR: "What is the name of the doctor taking care of you  now?"     Dr. Birdie Sons 6. VISIT DIAGNOSIS:  "What was the main symptom or problem that you were seen for?" "Were you given a diagnosis?"      Viral URI 7. VISIT MEDICINES: "Did the doctor order any new medicines for you to use?" If Yes, ask: "Have you filled the prescription and started taking the medicine?"      No  Protocols used: Recent Medical Visit for Illness Follow-up Call-A-[facility]

## 2023-12-07 NOTE — ED Triage Notes (Signed)
Patient to Urgent Care with complaints of productive cough/ nasal and chest congestion. Denies any known fevers.   Symptoms started over two weeks ago. Reports he was evaluated by an EMT at [facility] today at was told he should be further evaluated.  Taking robitussin.

## 2023-12-07 NOTE — ED Provider Notes (Signed)
Jon Sherman    CSN: [id number] Arrival date & time: 12/07/23  1227      History   Chief Complaint Chief Complaint  Patient presents with   Nasal Congestion    HPI Jon Sherman. is a 89 y.o. male.  Accompanied by his daughter, patient presents with 2-week history of congestion and cough.  His cough worsened over the weekend.  No fever or shortness of breath.  No OTC medication taken today; took Robitussin yesterday.  His medical history includes emphysema and COPD but he has not required treatment for these.  Patient was seen by his PCP on 12/04/2023; diagnosed with viral upper respiratory illness and acute cough; negative for influenza; treated symptomatically.  The history is provided by the patient, a relative and medical records.    Past Medical History:  Diagnosis Date   Basal cell carcinoma 06/29/2018   right forehead   Basal cell carcinoma 01/06/2023   L nasal ala, MOHs completed 04/02/23   Basal cell carcinoma 07/29/2023   right preauricular, EDC   Cancer (HCC) 1965   melanoma, LNs resected   COPD (chronic obstructive pulmonary disease) (HCC)    Infectious colitis 2000   requiring hospitalization   Influenza 02/18/2018   Melanoma (HCC) 1964   right abdomen   Melanoma in situ (HCC) 10/09/2020   right ear helix, MOHs 12/10/20 with Dr. Adriana Simas   Melanoma in situ of right lower extremity (HCC) 07/05/2018   right medial knee. Excised, margins free.   Melanoma of face (HCC) 08/20/2020   right lower cheek 0.83mm, Clarks Level II, Mohs with Dr. 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   Living will in place 04/21/2023   Durable power of attorney for healthcare 04/21/2023   Do not resuscitate status 04/21/2023   Folic acid deficiency 08/23/2022   Recurrent falls 08/20/2022   Chronic pain of  left knee 11/26/2021   History of vomiting 11/26/2021   Melanoma in situ (HCC) 10/09/2020   B12 deficiency 08/28/2020   Atherosclerosis of native coronary artery 08/27/2020   Emphysema of lung (HCC) 08/27/2020   Educated about COVID-19 virus infection 04/09/2019   Melanoma in situ of right lower extremity (HCC) 07/05/2018   Vitamin D deficiency 02/27/2015   History of shingles 02/22/2014   Benign paroxysmal positional vertigo 07/25/2013   History of colonic polyps 02/19/2013   Routine general medical examination at a health care facility 02/19/2013   History of melanoma    Rosacea     Past Surgical History:  Procedure Laterality Date   basal cell removal on nose  03/2023   on nose   CATARACT EXTRACTION W/PHACO Right 06/29/2019   Procedure: CATARACT EXTRACTION PHACO AND INTRAOCULAR LENS PLACEMENT (IOC) RIGHT;  Surgeon: Lockie Mola, MD;  Location: [facility];  Service: Ophthalmology;  Laterality: Right;   CATARACT EXTRACTION W/PHACO Left 08/31/2019   Procedure: CATARACT EXTRACTION PHACO AND INTRAOCULAR LENS PLACEMENT (IOC) LEFT 02:06.7  25.2%  32.01;  Surgeon: Lockie Mola, MD;  Location: [facility];  Service: Ophthalmology;  Laterality: Left;   MELANOMA EXCISION WITH SENTINEL LYMPH NODE BIOPSY     SOFT TISSUE TUMOR RESECTION  11/18/1983       Home Medications    Prior to Admission medications   Medication Sig Start Date End  Date Taking? Authorizing Provider  doxycycline (VIBRAMYCIN) 100 MG capsule Take 1 capsule (100 mg total) by mouth 2 (two) times daily for 7 days. 12/07/23 12/14/23 Yes Mickie Bail, NP  ciclopirox (LOPROX) 0.77 % cream Apply to feet and between toes twice a day. 07/29/23   Willeen Niece, MD  cyanocobalamin (VITAMIN B12) 1000 MCG/ML injection Inject 1 mL once a  month 10/06/23   Sherlene Shams, MD  metroNIDAZOLE (METROGEL) 0.75 % gel APPLY A THIN COAT TO THE FACE EVERY DAY 05/27/23   Willeen Niece, MD  mometasone (ELOCON) 0.1  % cream Apply twice daily to affected areas at ankles, legs. Follow with moisturizer. Avoid applying to face, groin, and axilla. Use as directed. Long-term use can cause thinning of the skin. 01/06/23   Willeen Niece, MD  Multiple Vitamins-Minerals (PRESERVISION AREDS 2) CAPS Take 1 capsule by mouth 2 (two) times daily.    [provider]  traZODone (DESYREL) 50 MG tablet TAKE 0.5-1 TABLETS BY MOUTH AT BEDTIME AS NEEDED FOR SLEEP. 10/29/23   Sherlene Shams, MD  Vitamin D, Ergocalciferol, (DRISDOL) 1.25 MG (50000 UNIT) CAPS capsule TAKE 1 CAPSULE BY MOUTH ONE TIME PER WEEK 04/24/23   Sherlene Shams, MD    Family History Family History  Problem Relation Age of Onset   Mental illness Mother    Heart failure Mother    Heart failure Father    Rheumatic fever Sister    Cancer Neg Hx    Drug abuse Neg Hx     Social History Social History   Tobacco Use   Smoking status: Former    Current packs/day: 0.00    Average packs/day: 1 pack/day for 10.0 years (10.0 ttl pk-yrs)    Types: Cigarettes    Start date: 02/23/1952    Quit date: 02/22/1962    Years since quitting: 61.8   Smokeless tobacco: Never  Vaping Use   Vaping status: Never Used  Substance Use Topics   Alcohol use: Yes    Alcohol/week: 7.0 standard drinks of alcohol    Types: 7 Standard drinks or equivalent per week    Comment: Occasional wine and beer with meal    Drug use: No     Allergies   Demerol and Meperidine   Review of Systems Review of Systems  Constitutional:  Negative for chills and fever.  HENT:  Positive for congestion. Negative for ear pain and sore throat.   Respiratory:  Positive for cough. Negative for shortness of breath.   Cardiovascular:  Negative for chest pain and palpitations.     Physical Exam Triage Vital Signs ED Triage Vitals  Encounter Vitals Group     BP 12/07/23 1251 120/65     Systolic BP Percentile --      Diastolic BP Percentile --      Pulse Rate 12/07/23 1246 100      Resp 12/07/23 1246 18     Temp 12/07/23 1246 98.8 F (37.1 C)     Temp src --      SpO2 12/07/23 1246 95 %     Weight --      Height --      Head Circumference --      Peak Flow --      Pain Score 12/07/23 1246 0     Pain Loc --      Pain Education --      Exclude from Growth Chart --    No data found.  Updated  Vital Signs BP 120/65   Pulse 100   Temp 98.8 F (37.1 C)   Resp 18   SpO2 95%   Visual Acuity Right Eye Distance:   Left Eye Distance:   Bilateral Distance:    Right Eye Near:   Left Eye Near:    Bilateral Near:     Physical Exam Constitutional:      General: He is not in acute distress. HENT:     Right Ear: Tympanic membrane normal.     Left Ear: Tympanic membrane normal.     Nose: Nose normal.     Mouth/Throat:     Mouth: Mucous membranes are moist.     Pharynx: Oropharynx is clear.  Cardiovascular:     Rate and Rhythm: Normal rate and regular rhythm.     Heart sounds: Normal heart sounds.  Pulmonary:     Effort: Pulmonary effort is normal. No respiratory distress.     Breath sounds: Rhonchi present.     Comments: Rhonchi in upper airway which clears with cough.  Cough productive of thick yellow sputum x 1 episode during exam. Neurological:     Mental Status: He is alert.      UC Treatments / Results  Labs (all labs ordered are listed, but only abnormal results are displayed) Labs Reviewed - No data to display  EKG   Radiology DG Chest 2 View Result Date: 12/07/2023 CLINICAL DATA:  Cough EXAM: CHEST - 2 VIEW COMPARISON:  04/08/2018 FINDINGS: Mild central peribronchial thickening. No confluent airspace infiltrate. Heart size normal. No effusion. Regional bones unremarkable. IMPRESSION: Mild central peribronchial thickening suggesting asthma, bronchitis, or viral syndrome. Electronically Signed   By: Corlis Leak M.D.   On: 12/07/2023 14:36    Procedures Procedures (including critical care time)  Medications Ordered in UC Medications - No  data to display  Initial Impression / Assessment and Plan / UC Course  I have reviewed the triage vital signs and the nursing notes.  Pertinent labs & imaging results that were available during my care of the patient were reviewed by me and considered in my medical decision making (see chart for details).    Productive cough, COPD exacerbation.  O2 sat 95% on room air.  Patient's chart shows history of emphysema/COPD but his daughter reports that he has never really had any issues with this.  He does not require nebulizers or inhalers at baseline.  CXR shows peribronchial thickening.  Treating today with doxycycline.  Instructed patient and his daughter to follow-up with his PCP tomorrow.  ED precautions given.  Education provided on COPD exacerbation.  Patient and his daughter agree to plan of care.  Final Clinical Impressions(s) / UC Diagnoses   Final diagnoses:  Productive cough  COPD exacerbation [facility])     Discharge Instructions      Your chest xray is pending.    Take the doxycycline as directed.    Follow up with your primary care provider tomorrow.  Go to the emergency department if you have worsening symptoms.        ED Prescriptions     Medication Sig Dispense Auth. Provider   doxycycline (VIBRAMYCIN) 100 MG capsule Take 1 capsule (100 mg total) by mouth 2 (two) times daily for 7 days. 14 capsule Mickie Bail, NP      PDMP not reviewed this encounter.   Mickie Bail, NP 12/07/23 [phone]

## 2023-12-07 NOTE — Telephone Encounter (Signed)
Pt was seen at urgent care today.

## 2023-12-07 NOTE — Discharge Instructions (Addendum)
Your chest xray is pending.    Take the doxycycline as directed.    Follow up with your primary care provider tomorrow.  Go to the emergency department if you have worsening symptoms.

## 2023-12-09 ENCOUNTER — Ambulatory Visit: Payer: Medicare PPO | Admitting: Family

## 2023-12-09 ENCOUNTER — Encounter: Payer: Self-pay | Admitting: Family

## 2023-12-09 VITALS — BP 120/70 | HR 100 | Temp 97.9°F | Ht 69.0 in | Wt 181.4 lb

## 2023-12-09 DIAGNOSIS — J069 Acute upper respiratory infection, unspecified: Secondary | ICD-10-CM | POA: Diagnosis not present

## 2023-12-09 NOTE — Progress Notes (Signed)
Assessment & Plan:  Viral upper respiratory illness Assessment & Plan: No acute respiratory distress.  Afebrile.  Reviewed evaluation 12/04/2023, subsequent evaluation 12/07/2023 and chest x-ray.  Patient feeling better on doxycycline.  Advised him to continue doxycycline along with over-the-counter Robitussin DM.  He will let me know if symptoms do not completely resolve      Return precautions given.   Risks, benefits, and alternatives of the medications and treatment plan prescribed today were discussed, and patient expressed understanding.   Education regarding symptom management and diagnosis given to patient on AVS either electronically or printed.  No follow-ups on file.  Rennie Plowman, FNP  Subjective:    Patient ID: Jon Musca., male    DOB: [birth year], 89 y.o.   MRN: [id number]  CC: Jon Saladino. is a 89 y.o. male who presents today for an acute visit.    HPI: Accompanied by daughter   Here today for follow-up after urgent care visit.    Complains of productive cough x 2 weeks, improved.   Denies F, sob, wheezing, sinus pain, ear pain, leg swelling  He is using Robitussin DM with relief.    Started on doxycycline 2 days ago 100 mg twice daily. Negative influenza 12/04/2023  GFR 51 History of basal cell carcinoma, COPD, melanoma  Remote history of smoking  Seen 12/04/2023 for URI; negative influenza, advised supportive care Reevaluated 12/07/2023 for suspected COPD exacerbation. Chest x-ray with peribronchial thickening.     Allergies: Demerol and Meperidine Current Outpatient Medications on File Prior to Visit  Medication Sig Dispense Refill   ciclopirox (LOPROX) 0.77 % cream Apply to feet and between toes twice a day. 90 g 2   cyanocobalamin (VITAMIN B12) 1000 MCG/ML injection Inject 1 mL once a  month 3 mL 3   doxycycline (VIBRAMYCIN) 100 MG capsule Take 1 capsule (100 mg total) by mouth 2 (two) times daily for 7 days. 14 capsule 0    metroNIDAZOLE (METROGEL) 0.75 % gel APPLY A THIN COAT TO THE FACE EVERY DAY 45 g 11   mometasone (ELOCON) 0.1 % cream Apply twice daily to affected areas at ankles, legs. Follow with moisturizer. Avoid applying to face, groin, and axilla. Use as directed. Long-term use can cause thinning of the skin. 45 g 1   Multiple Vitamins-Minerals (PRESERVISION AREDS 2) CAPS Take 1 capsule by mouth 2 (two) times daily.     traZODone (DESYREL) 50 MG tablet TAKE 0.5-1 TABLETS BY MOUTH AT BEDTIME AS NEEDED FOR SLEEP. 90 tablet 1   Vitamin D, Ergocalciferol, (DRISDOL) 1.25 MG (50000 UNIT) CAPS capsule TAKE 1 CAPSULE BY MOUTH ONE TIME PER WEEK 12 capsule 0   No current facility-administered medications on file prior to visit.    Review of Systems  Constitutional:  Negative for chills and fever.  HENT:  Positive for congestion. Negative for sinus pain and sore throat.   Respiratory:  Positive for cough. Negative for shortness of breath and wheezing.   Cardiovascular:  Negative for chest pain, palpitations and leg swelling.  Gastrointestinal:  Negative for nausea and vomiting.      Objective:    BP 120/70   Pulse 100   Temp 97.9 F (36.6 C) (Oral)   Ht 5\' 9"  (1.753 m)   Wt 181 lb 6.4 oz (82.3 kg)   SpO2 97%   BMI 26.79 kg/m   BP Readings from Last 3 Encounters:  12/09/23 120/70  12/07/23 120/65  12/04/23 132/74   Wt Readings from Last  3 Encounters:  12/09/23 181 lb 6.4 oz (82.3 kg)  12/04/23 181 lb 9.6 oz (82.4 kg)  10/06/23 178 lb 9.6 oz (81 kg)    Physical Exam Vitals reviewed.  Constitutional:      Appearance: He is well-developed.  HENT:     Head: Normocephalic and atraumatic.     Right Ear: Hearing, tympanic membrane, ear canal and external ear normal. No decreased hearing noted. No drainage, swelling or tenderness. No middle ear effusion. Tympanic membrane is not injected, erythematous or bulging.     Left Ear: Hearing, tympanic membrane, ear canal and external ear normal. No  decreased hearing noted. No drainage, swelling or tenderness.  No middle ear effusion. Tympanic membrane is not injected, erythematous or bulging.     Nose: Nose normal.     Right Sinus: No maxillary sinus tenderness or frontal sinus tenderness.     Left Sinus: No maxillary sinus tenderness or frontal sinus tenderness.     Mouth/Throat:     Pharynx: Uvula midline. No oropharyngeal exudate or posterior oropharyngeal erythema.     Tonsils: No tonsillar abscesses.  Eyes:     Conjunctiva/sclera: Conjunctivae normal.  Cardiovascular:     Rate and Rhythm: Regular rhythm.     Heart sounds: Normal heart sounds.  Pulmonary:     Effort: Pulmonary effort is normal. No respiratory distress.     Breath sounds: Normal breath sounds. No wheezing, rhonchi or rales.  Musculoskeletal:     Right lower leg: No edema.     Left lower leg: No edema.  Lymphadenopathy:     Head:     Right side of head: No submental, submandibular, tonsillar, preauricular, posterior auricular or occipital adenopathy.     Left side of head: No submental, submandibular, tonsillar, preauricular, posterior auricular or occipital adenopathy.     Cervical: No cervical adenopathy.  Skin:    General: Skin is warm and dry.  Neurological:     Mental Status: He is alert.  Psychiatric:        Speech: Speech normal.        Behavior: Behavior normal.

## 2023-12-09 NOTE — Patient Instructions (Signed)
Pleased to hear that you are feeling better.  Please complete doxycycline.  Continue yogurt daily. You may continue Robitussin DM.  Plenty of water will make secretions less sticky. Please let me know if symptoms were to change or you have new concerns

## 2023-12-09 NOTE — Assessment & Plan Note (Signed)
No acute respiratory distress.  Afebrile.  Reviewed evaluation 12/04/2023, subsequent evaluation 12/07/2023 and chest x-ray.  Patient feeling better on doxycycline.  Advised him to continue doxycycline along with over-the-counter Robitussin DM.  He will let me know if symptoms do not completely resolve

## 2023-12-10 ENCOUNTER — Encounter: Payer: Self-pay | Admitting: Family

## 2023-12-11 ENCOUNTER — Telehealth: Payer: Self-pay

## 2023-12-11 NOTE — Telephone Encounter (Signed)
Copied from CRM [id number]. Topic: General - Other >> Dec 11, 2023  2:51 PM Gaetano Hawthorne wrote: Reason for CRM: Debbie from [facility] called to confirm that the office has received their request regarding the last 3 office visits - they need this information for nursing assessment that they were hoping to provide next week. Her call back number is  [phone]. She mentioned that she faxed over her request on 01/22. Please call to confirm that you received their request - they addressed the request to Dr. Darrick Huntsman.

## 2023-12-11 NOTE — Telephone Encounter (Signed)
Spoke with Eunice Blase to let her know that we did receive the fax and that we have faxed the office notes back to them. Debbie confirmed that they have received the notes.

## 2023-12-12 IMAGING — US US EXTREM LOW VENOUS*L*
1 series · 14 of 24 positions shown · non-contrast
Comparison: LEFT lower extremity XRs, 11/26/2021.

CLINICAL DATA: LEFT lower extremity pain.

EXAM:
LEFT LOWER EXTREMITY VENOUS DOPPLER ULTRASOUND
TECHNIQUE: Gray-scale sonography with compression, as well as color and duplex
ultrasound, were performed to evaluate the deep venous system(s)
from the level of the common femoral vein through the popliteal and
proximal calf veins.

[Series 1: us extrem low venous*left* · 0.08mm/px · 14 of 33 slices shown]
[im 1/33]
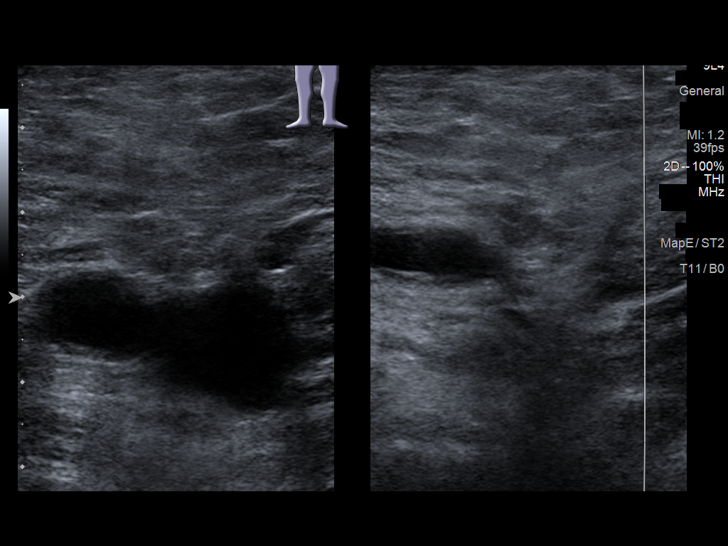
[im 3/33]
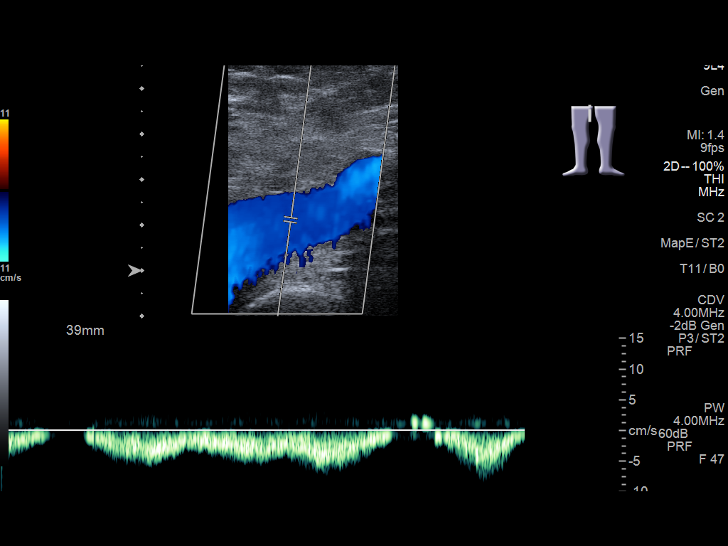
[im 6/33]
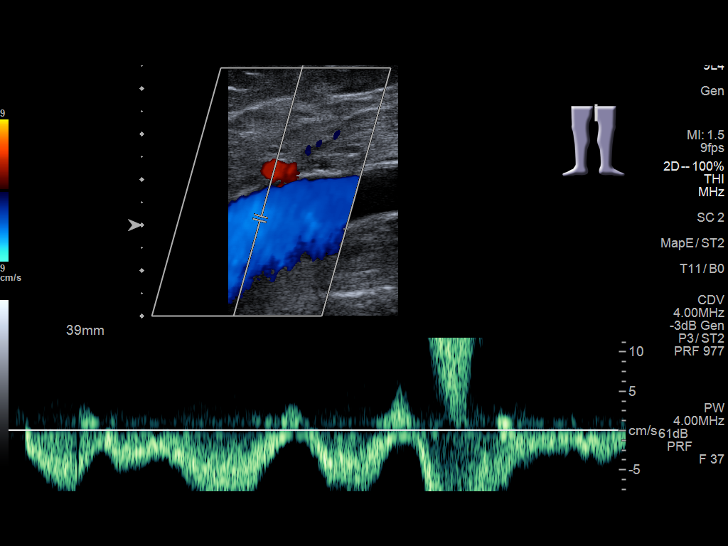
[im 9/33]
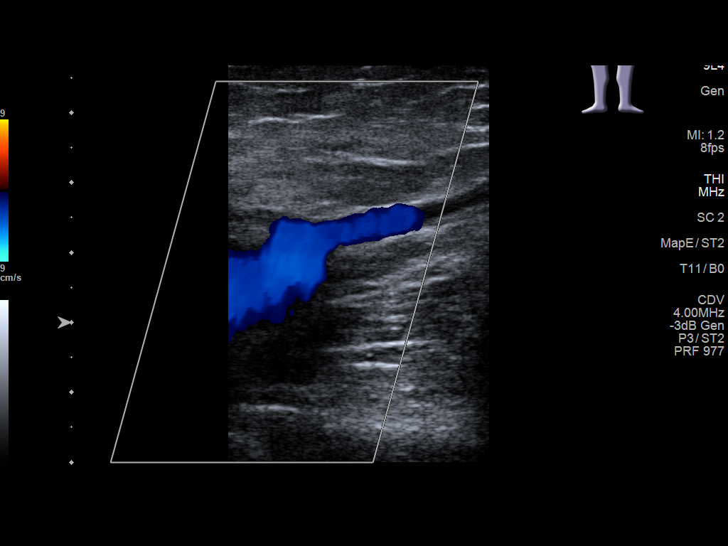
[im 10/33]
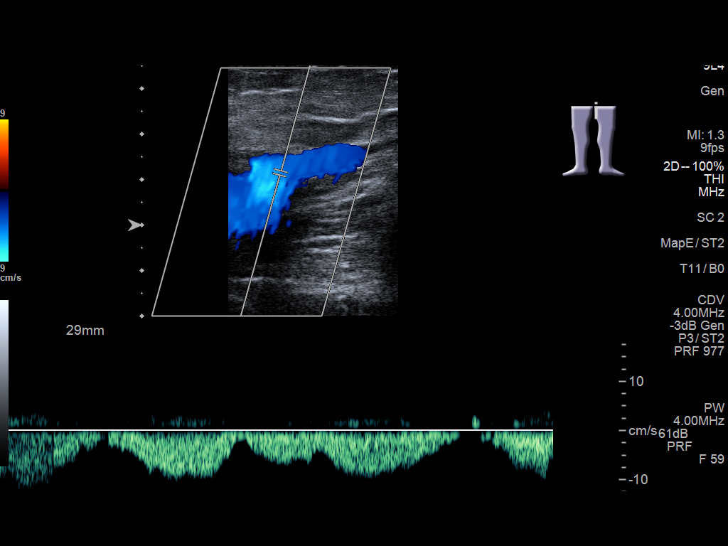
[im 13/33]
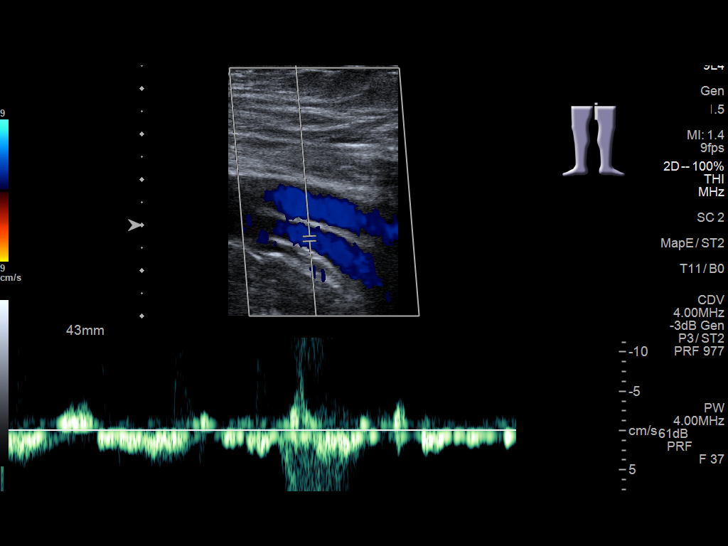
[im 16/33]
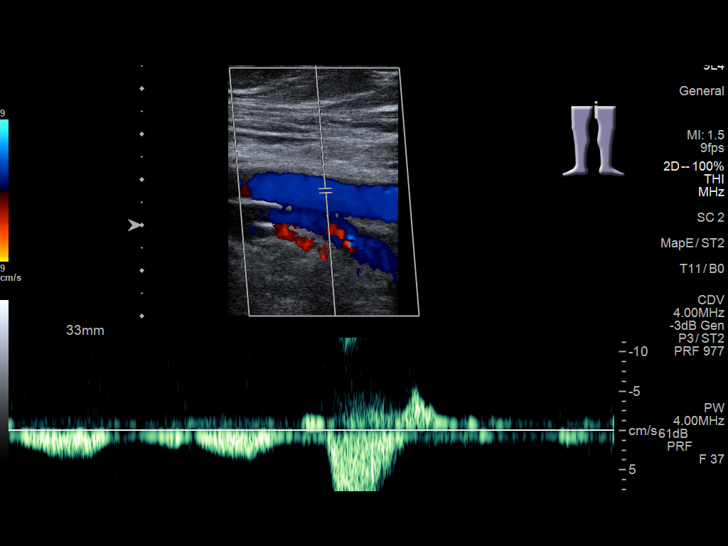
[im 17/33]
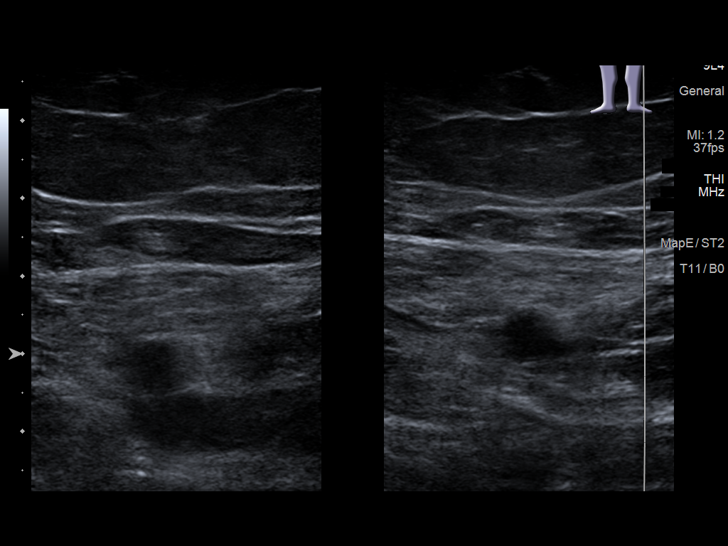
[im 20/33]
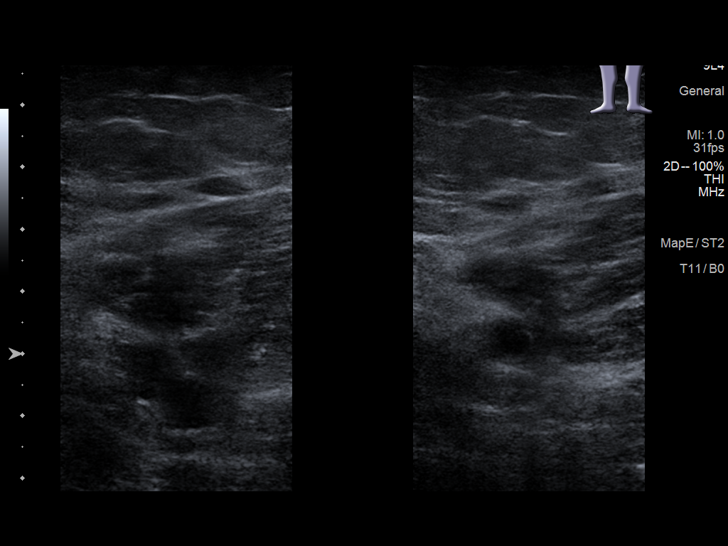
[im 23/33]
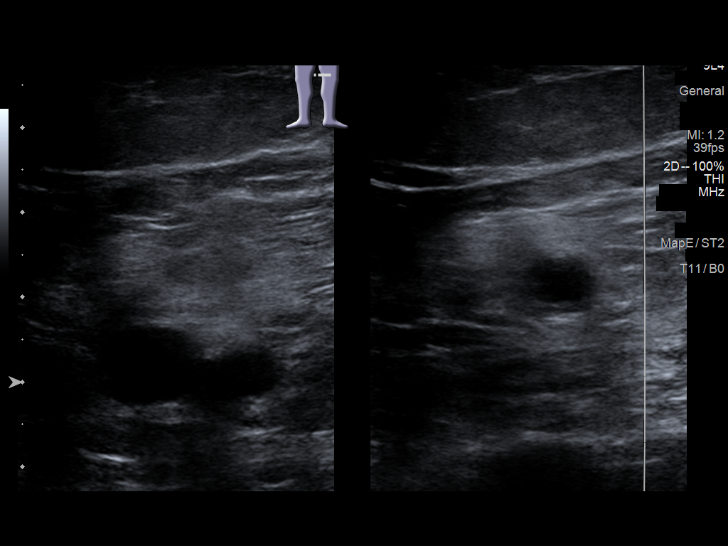
[im 26/33]
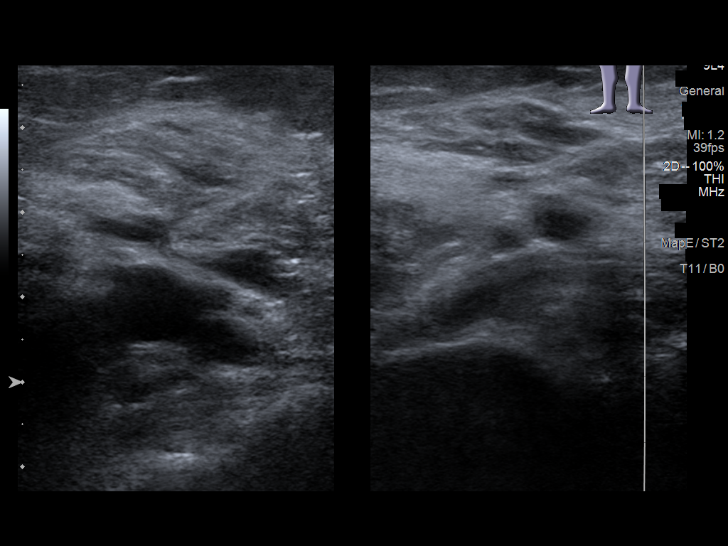
[im 27/33]
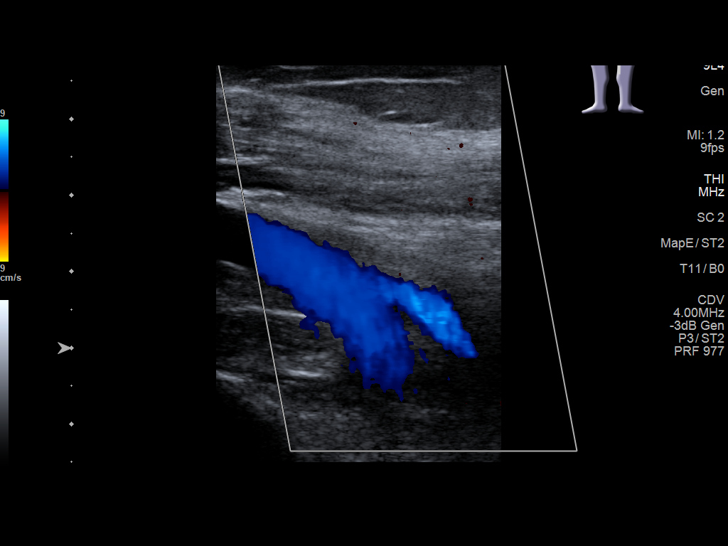
[im 30/33]
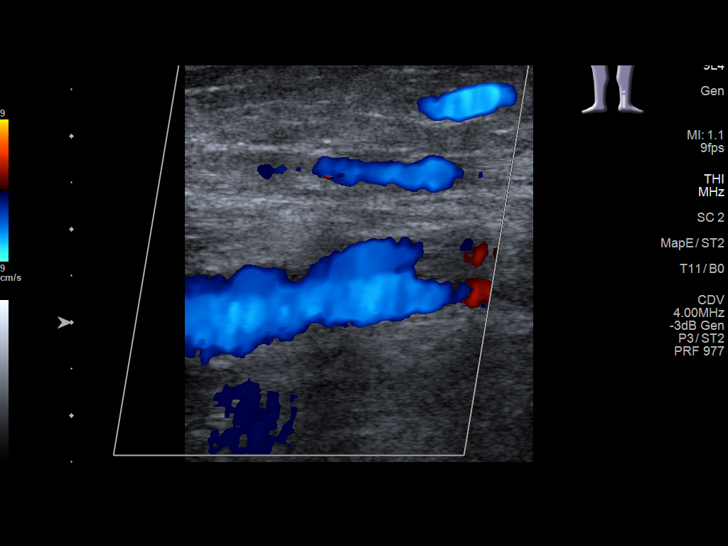
[im 33/33]
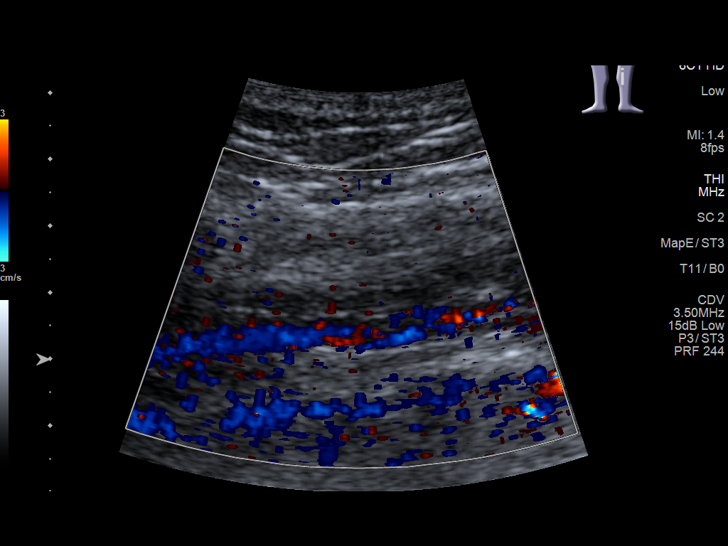

[14 of 24 positions shown; findings below may reference images not displayed]

FINDINGS: VENOUS

Normal compressibility of the LEFT common femoral, superficial
femoral, and popliteal veins, as well as the visualized calf veins.
Visualized portions of profunda femoral vein and great saphenous
vein unremarkable. No filling defects to suggest DVT on grayscale or
color Doppler imaging. Doppler waveforms show normal direction of
venous flow, normal respiratory plasticity and response to
augmentation.

Limited views of the contralateral common femoral vein are
unremarkable.

OTHER

No evidence of superficial thrombophlebitis or abnormal fluid
collection.

Limitations: none
IMPRESSION: No evidence of femoropopliteal DVT within the LEFT lower extremity.

## 2023-12-21 ENCOUNTER — Ambulatory Visit: Payer: Medicare PPO | Admitting: Family

## 2023-12-21 ENCOUNTER — Encounter: Payer: Self-pay | Admitting: Family

## 2023-12-21 VITALS — BP 126/74 | HR 76 | Temp 97.6°F | Ht 68.0 in | Wt 175.4 lb

## 2023-12-21 DIAGNOSIS — G8929 Other chronic pain: Secondary | ICD-10-CM | POA: Diagnosis not present

## 2023-12-21 DIAGNOSIS — M25562 Pain in left knee: Secondary | ICD-10-CM

## 2023-12-21 NOTE — Progress Notes (Unsigned)
   Assessment & Plan:  There are no diagnoses linked to this encounter.   Return precautions given.   Risks, benefits, and alternatives of the medications and treatment plan prescribed today were discussed, and patient expressed understanding.   Education regarding symptom management and diagnosis given to patient on AVS either electronically or printed.  No follow-ups on file.  Rennie Plowman, FNP  Subjective:    Patient ID: Jon Sherman., male    DOB: Sep 17, 1928, 89 y.o.   MRN: [id number]  CC: Jon Sherman. is a 89 y.o. male who presents today for an acute visit.    HPI: Accompanied by daughter  Concern for unsteadiness He is using cane on curbs.    He lives on one level. No stairs.  No recent falls.   He walks outside on occasion when weather is warmer. He sits most of the day however does do household chores including laundry. He cooks for breakfast.   He continues to drive.     History of atherosclerosis, emphysema, melanoma, vertigo History of cataract extraction. Occasional wine, former smoker He [facility] at [facility]    Allergies: Demerol and Meperidine Current Outpatient Medications on File Prior to Visit  Medication Sig Dispense Refill   ciclopirox (LOPROX) 0.77 % cream Apply to feet and between toes twice a day. 90 g 2   cyanocobalamin (VITAMIN B12) 1000 MCG/ML injection Inject 1 mL once a  month 3 mL 3   metroNIDAZOLE (METROGEL) 0.75 % gel APPLY A THIN COAT TO THE FACE EVERY DAY 45 g 11   mometasone (ELOCON) 0.1 % cream Apply twice daily to affected areas at ankles, legs. Follow with moisturizer. Avoid applying to face, groin, and axilla. Use as directed. Long-term use can cause thinning of the skin. 45 g 1   Multiple Vitamins-Minerals (PRESERVISION AREDS 2) CAPS Take 1 capsule by mouth 2 (two) times daily.     traZODone (DESYREL) 50 MG tablet TAKE 0.5-1 TABLETS BY MOUTH AT BEDTIME AS NEEDED FOR SLEEP. 90 tablet 1   Vitamin D, Ergocalciferol,  (DRISDOL) 1.25 MG (50000 UNIT) CAPS capsule TAKE 1 CAPSULE BY MOUTH ONE TIME PER WEEK 12 capsule 0   No current facility-administered medications on file prior to visit.    Review of Systems    Objective:    There were no vitals taken for this visit.  BP Readings from Last 3 Encounters:  12/09/23 120/70  12/07/23 120/65  12/04/23 132/74   Wt Readings from Last 3 Encounters:  12/09/23 181 lb 6.4 oz (82.3 kg)  12/04/23 181 lb 9.6 oz (82.4 kg)  10/06/23 178 lb 9.6 oz (81 kg)    Physical Exam

## 2023-12-24 NOTE — Assessment & Plan Note (Signed)
 No knee pain at this time.  Patient appeared extremely well today.  He is not concerned with falls or risk of falls.  He politely declines home health referral for physical therapy and fall assessment.  He will let us  know how we can support him.

## 2024-01-13 ENCOUNTER — Encounter: Payer: Self-pay | Admitting: Family

## 2024-01-20 ENCOUNTER — Ambulatory Visit: Payer: Medicare PPO | Admitting: Family

## 2024-01-20 ENCOUNTER — Encounter: Payer: Self-pay | Admitting: Family

## 2024-01-20 VITALS — BP 124/78 | HR 79 | Temp 97.9°F | Ht 68.0 in | Wt 179.8 lb

## 2024-01-20 DIAGNOSIS — F320 Major depressive disorder, single episode, mild: Secondary | ICD-10-CM | POA: Diagnosis not present

## 2024-01-20 MED ORDER — TRAZODONE HCL 50 MG PO TABS: 100.0000 mg | ORAL_TABLET | Freq: Every day | ORAL | 3 refills | Status: DC

## 2024-01-20 NOTE — Assessment & Plan Note (Signed)
 New over the last several months as wife has moved to memory care.  Difficulty staying asleep.  Increase trazodone 100 mg nightly.  Close follow-up

## 2024-01-20 NOTE — Patient Instructions (Signed)
 As disucssed, trazodone is a medication use both for insomnia and depression.  Currently you are taking trazodone 50 mg bedtime.    I recommend increasing this to 100 mg at bedtime.  As you are on the trazodone 50 mg tablet, you may simply take two of 50 mg tablets of trazodone.    You may also trial melatonin combination over the counter supplement such as Sleep#3 ( L- theanine, camomile, lavender, valerian root, and melatonin 10mg )   or Qunol Sleep 5 in 1 (melatonin 5mg , Ashwagandha, GABA, valerian root, L-theanine) after you have adjusted to trazodone 100mg   Please let us know how you are doing

## 2024-01-20 NOTE — Progress Notes (Signed)
 Assessment & Plan:  Depression, major, single episode, mild (HCC) Assessment & Plan: New over the last several months as wife has moved to memory care.  Difficulty staying asleep.  Increase trazodone 100 mg nightly.  Close follow-up  Orders: -     traZODone HCl; Take 2 tablets (100 mg total) by mouth at bedtime.  Dispense: 180 tablet; Refill: 3     Return precautions given.   Risks, benefits, and alternatives of the medications and treatment plan prescribed today were discussed, and patient expressed understanding.   Education regarding symptom management and diagnosis given to patient on AVS either electronically or printed.  Return in about 2 months (around 03/21/2024).  Rennie Plowman, FNP  Subjective:    Patient ID: Jon Musca., male    DOB: [birth year], 89 y.o.   MRN: [id number]  CC: Jon Cervone. is a 89 y.o. male who presents today for follow up.   HPI: Complains of depression since wife moved out of his home to memory care last year.  This has been painful for him as wife wants to come home however he knows it would be unsafe for her to do so.  He describes apathy.   He continues to drive . He enjoys reading.   He moves to assisted living this month and hopeful his wife would be able to safely move into apartment with him, with assistance.   Lives in at [facility]; wife had to go to memory care building in 2024 and no longer living with him  He has 3 children whom live [place] and who are supportive of him.    He is taking trazodone 50mg  at bedtime .  He will wake up early mornings , sometimes he is unable to fall back asleep.    Denies suicidal plan, thoughts of hurting himself or anyone else  Allergies: Demerol and Meperidine Current Outpatient Medications on File Prior to Visit  Medication Sig Dispense Refill   ciclopirox (LOPROX) 0.77 % cream Apply to feet and between toes twice a day. 90 g 2   cyanocobalamin (VITAMIN B12) 1000 MCG/ML injection  Inject 1 mL once a  month 3 mL 3   metroNIDAZOLE (METROGEL) 0.75 % gel APPLY A THIN COAT TO THE FACE EVERY DAY 45 g 11   mometasone (ELOCON) 0.1 % cream Apply twice daily to affected areas at ankles, legs. Follow with moisturizer. Avoid applying to face, groin, and axilla. Use as directed. Long-term use can cause thinning of the skin. 45 g 1   Multiple Vitamins-Minerals (PRESERVISION AREDS 2) CAPS Take 1 capsule by mouth 2 (two) times daily.     Vitamin D, Ergocalciferol, (DRISDOL) 1.25 MG (50000 UNIT) CAPS capsule TAKE 1 CAPSULE BY MOUTH ONE TIME PER WEEK 12 capsule 0   No current facility-administered medications on file prior to visit.    Review of Systems  Constitutional:  Negative for chills and fever.  Respiratory:  Negative for cough.   Cardiovascular:  Negative for chest pain and palpitations.  Gastrointestinal:  Negative for nausea and vomiting.  Psychiatric/Behavioral:  Positive for sleep disturbance. Negative for suicidal ideas.       Objective:    BP 124/78   Pulse 79   Temp 97.9 F (36.6 C) (Oral)   Ht 5\' 8"  (1.727 m)   Wt 179 lb 12.8 oz (81.6 kg)   SpO2 97%   BMI 27.34 kg/m  BP Readings from Last 3 Encounters:  01/20/24 124/78  12/21/23 126/74  12/09/23  120/70   Wt Readings from Last 3 Encounters:  01/20/24 179 lb 12.8 oz (81.6 kg)  12/21/23 175 lb 6.4 oz (79.6 kg)  12/09/23 181 lb 6.4 oz (82.3 kg)    Physical Exam Vitals reviewed.  Constitutional:      Appearance: He is well-developed.  Cardiovascular:     Rate and Rhythm: Regular rhythm.     Heart sounds: Normal heart sounds.  Pulmonary:     Effort: Pulmonary effort is normal. No respiratory distress.     Breath sounds: Normal breath sounds. No wheezing, rhonchi or rales.  Skin:    General: Skin is warm and dry.  Neurological:     Mental Status: He is alert.  Psychiatric:        Speech: Speech normal.        Behavior: Behavior normal.

## 2024-02-05 DIAGNOSIS — F4321 Adjustment disorder with depressed mood: Secondary | ICD-10-CM | POA: Diagnosis not present

## 2024-02-08 ENCOUNTER — Ambulatory Visit: Payer: Medicare PPO | Admitting: Dermatology

## 2024-02-09 ENCOUNTER — Encounter: Payer: Self-pay | Admitting: Dermatology

## 2024-02-09 ENCOUNTER — Ambulatory Visit: Admitting: Dermatology

## 2024-02-09 DIAGNOSIS — L300 Nummular dermatitis: Secondary | ICD-10-CM

## 2024-02-09 DIAGNOSIS — L821 Other seborrheic keratosis: Secondary | ICD-10-CM

## 2024-02-09 DIAGNOSIS — L570 Actinic keratosis: Secondary | ICD-10-CM | POA: Diagnosis not present

## 2024-02-09 DIAGNOSIS — W908XXA Exposure to other nonionizing radiation, initial encounter: Secondary | ICD-10-CM

## 2024-02-09 DIAGNOSIS — B353 Tinea pedis: Secondary | ICD-10-CM

## 2024-02-09 DIAGNOSIS — L814 Other melanin hyperpigmentation: Secondary | ICD-10-CM | POA: Diagnosis not present

## 2024-02-09 DIAGNOSIS — D229 Melanocytic nevi, unspecified: Secondary | ICD-10-CM

## 2024-02-09 DIAGNOSIS — Z86006 Personal history of melanoma in-situ: Secondary | ICD-10-CM

## 2024-02-09 DIAGNOSIS — L820 Inflamed seborrheic keratosis: Secondary | ICD-10-CM | POA: Diagnosis not present

## 2024-02-09 DIAGNOSIS — Z1283 Encounter for screening for malignant neoplasm of skin: Secondary | ICD-10-CM | POA: Diagnosis not present

## 2024-02-09 DIAGNOSIS — D2261 Melanocytic nevi of right upper limb, including shoulder: Secondary | ICD-10-CM | POA: Diagnosis not present

## 2024-02-09 DIAGNOSIS — D225 Melanocytic nevi of trunk: Secondary | ICD-10-CM | POA: Diagnosis not present

## 2024-02-09 DIAGNOSIS — Z85820 Personal history of malignant melanoma of skin: Secondary | ICD-10-CM

## 2024-02-09 DIAGNOSIS — D1801 Hemangioma of skin and subcutaneous tissue: Secondary | ICD-10-CM

## 2024-02-09 DIAGNOSIS — L578 Other skin changes due to chronic exposure to nonionizing radiation: Secondary | ICD-10-CM | POA: Diagnosis not present

## 2024-02-09 DIAGNOSIS — Z85828 Personal history of other malignant neoplasm of skin: Secondary | ICD-10-CM

## 2024-02-09 NOTE — Patient Instructions (Addendum)
 Cryotherapy Aftercare  Wash gently with soap and water everyday.   Apply Vaseline Jelly daily until healed.    Use Mometasone cream (pink label) twice daily as needed for rash on legs and ankles.  Continue Ketoconazole 2%  cream (blue label) Apply to feet and between toes twice a day    Melanoma ABCDEs  Melanoma is the most dangerous type of skin cancer, and is the leading cause of death from skin disease.  You are more likely to develop melanoma if you: Have light-colored skin, light-colored eyes, or red or blond hair Spend a lot of time in the sun Tan regularly, either outdoors or in a tanning bed Have had blistering sunburns, especially during childhood Have a close family member who has had a melanoma Have atypical moles or large birthmarks  Early detection of melanoma is key since treatment is typically straightforward and cure rates are extremely high if we catch it early.   The first sign of melanoma is often a change in a mole or a new dark spot.  The ABCDE system is a way of remembering the signs of melanoma.  A for asymmetry:  The two halves do not match. B for border:  The edges of the growth are irregular. C for color:  A mixture of colors are present instead of an even brown color. D for diameter:  Melanomas are usually (but not always) greater than 6mm - the size of a pencil eraser. E for evolution:  The spot keeps changing in size, shape, and color.  Please check your skin once per month between visits. You can use a small mirror in front and a large mirror behind you to keep an eye on the back side or your body.   If you see any new or changing lesions before your next follow-up, please call to schedule a visit.  Please continue daily skin protection including broad spectrum sunscreen SPF 30+ to sun-exposed areas, reapplying every 2 hours as needed when you're outdoors.   Staying in the shade or wearing long sleeves, sun glasses (UVA+UVB protection) and wide brim  hats (4-inch brim around the entire circumference of the hat) are also recommended for sun protection.       Due to recent changes in healthcare laws, you may see results of your pathology and/or laboratory studies on MyChart before the doctors have had a chance to review them. We understand that in some cases there may be results that are confusing or concerning to you. Please understand that not all results are received at the same time and often the doctors may need to interpret multiple results in order to provide you with the best plan of care or course of treatment. Therefore, we ask that you please give [place] 2 business days to thoroughly review all your results before contacting the office for clarification. Should we see a critical lab result, you will be contacted sooner.   If You Need Anything After Your Visit  If you have any questions or concerns for your doctor, please call our main line at [phone] and press option 4 to reach your doctor's medical assistant. If no one answers, please leave a voicemail as directed and we will return your call as soon as possible. Messages left after 4 pm will be answered the following business day.   You may also send [place] a message via MyChart. We typically respond to MyChart messages within 1-2 business days.  For prescription refills, please ask your pharmacy to  contact our office. Our fax number is [phone].  If you have an urgent issue when the clinic is closed that cannot wait until the next business day, you can page your doctor at the number below.    Please note that while we do our best to be available for urgent issues outside of office hours, we are not available 24/7.   If you have an urgent issue and are unable to reach [place], you may choose to seek medical care at your doctor's office, retail clinic, urgent care center, or emergency room.  If you have a medical emergency, please immediately call 911 or go to the emergency  department.  Pager Numbers  - Dr. Gwen Pounds: [phone]  - Dr. Roseanne Reno: [phone]  - Dr. Katrinka Blazing: [phone]   In the event of inclement weather, please call our main line at [phone] for an update on the status of any delays or closures.  Dermatology Medication Tips: Please keep the boxes that topical medications come in in order to help keep track of the instructions about where and how to use these. Pharmacies typically print the medication instructions only on the boxes and not directly on the medication tubes.   If your medication is too expensive, please contact our office at [phone] option 4 or send [place] a message through MyChart.   We are unable to tell what your co-pay for medications will be in advance as this is different depending on your insurance coverage. However, we may be able to find a substitute medication at lower cost or fill out paperwork to get insurance to cover a needed medication.   If a prior authorization is required to get your medication covered by your insurance company, please allow [place] 1-2 business days to complete this process.  Drug prices often vary depending on where the prescription is filled and some pharmacies may offer cheaper prices.  The website www.goodrx.com contains coupons for medications through different pharmacies. The prices here do not account for what the cost may be with help from insurance (it may be cheaper with your insurance), but the website can give you the price if you did not use any insurance.  - You can print the associated coupon and take it with your prescription to the pharmacy.  - You may also stop by our office during regular business hours and pick up a GoodRx coupon card.  - If you need your prescription sent electronically to a different pharmacy, notify our office through [facility] or by phone at [phone] option 4.     Si Usted Necesita Algo Despus de Su Visita  Tambin puede enviarnos  un mensaje a travs de Clinical cytogeneticist. Por lo general respondemos a los mensajes de MyChart en el transcurso de 1 a 2 das hbiles.  Para renovar recetas, por favor pida a su farmacia que se ponga en contacto con nuestra oficina. Annie Sable de fax es [place] [phone].  Si tiene un asunto urgente cuando la clnica est cerrada y que no puede esperar hasta el siguiente da hbil, puede llamar/localizar a su doctor(a) al nmero que aparece a continuacin.   Por favor, tenga en cuenta que aunque hacemos todo lo posible para estar disponibles para asuntos urgentes fuera del horario de [place], no estamos disponibles las 24 horas del da, los 7 [address]  [address] de la [place].   Si tiene un problema urgente y no puede comunicarse con nosotros, puede optar por buscar atencin mdica  en el consultorio de su doctor(a), en [place] [facility]  clnica privada, en un centro de atencin urgente o en una sala de emergencias.  Si tiene Engineer, drilling, por favor llame inmediatamente al 911 o vaya a la sala de emergencias.  Nmeros de bper  - Dr. Gwen Pounds: [phone]  - Dra. Roseanne Reno: [phone]  - Dr. Katrinka Blazing: [phone]   En caso de inclemencias del tiempo, por favor llame a Lacy Duverney principal al [phone] para una actualizacin sobre el [place] de cualquier retraso o cierre.  Consejos para la medicacin en dermatologa: Por favor, guarde las cajas en las que vienen los medicamentos de uso tpico para ayudarle a seguir las instrucciones sobre dnde y cmo usarlos. Las farmacias generalmente imprimen las instrucciones del medicamento slo en las cajas y no directamente en los tubos del [place].   Si su medicamento es muy caro, por favor, pngase en contacto con Rolm Gala llamando al [phone] y presione la opcin 4 o envenos un mensaje a travs de Clinical cytogeneticist.   No podemos decirle cul ser su copago por los medicamentos por adelantado ya que esto es diferente dependiendo de la cobertura de su seguro. Sin  embargo, es posible que podamos encontrar un medicamento sustituto a Audiological scientist un formulario para que el seguro cubra el medicamento que se considera necesario.   Si se requiere una autorizacin previa para que su compaa de seguros [place] su medicamento, por favor permtanos de 1 a 2 das hbiles para completar [address]  Los precios de los medicamentos varan con frecuencia dependiendo del Environmental consultant de dnde se surte la receta y alguna farmacias pueden ofrecer precios ms baratos.  El sitio web www.goodrx.com tiene cupones para medicamentos de Health and safety inspector. Los precios aqu no tienen en cuenta lo que podra costar con la ayuda del seguro (puede ser ms barato con su seguro), pero el sitio web puede darle el precio si no utiliz Tourist information centre manager.  - Puede imprimir el cupn correspondiente y llevarlo con su receta a la farmacia.  - Tambin puede pasar por nuestra oficina durante el horario de atencin regular y Education officer, museum una tarjeta de cupones de GoodRx.  - Si necesita que su receta se enve electrnicamente a una farmacia diferente, informe a nuestra oficina a travs de MyChart de [place] o por telfono llamando al [phone] y presione la opcin 4.

## 2024-02-09 NOTE — Progress Notes (Signed)
 Follow-Up Visit   Subjective  Jon Brock. is a 89 y.o. male who presents for the following: Skin Cancer Screening and Full Body Skin Exam. HxMM, HxMIS, Hx BCC  Check new spot on right dorsal hand that is bothersome.  The patient presents for Full Body Skin Exam (FBSE) for skin cancer screening and mole check. The patient has spots, moles and lesions to be evaluated, some may be new or changing and the patient may have concern these could be cancer.   The following portions of the chart were reviewed this encounter and updated as appropriate: medications, allergies, medical history  Review of Systems:  No other skin or systemic complaints except as noted in HPI or Assessment and Plan.  Objective  Well appearing patient in no apparent distress; mood and affect are within normal limits.  A full examination was performed including scalp, head, eyes, ears, nose, lips, neck, chest, axillae, abdomen, back, buttocks, bilateral upper extremities, bilateral lower extremities, hands, feet, fingers, toes, fingernails, and toenails. All findings within normal limits unless otherwise noted below.   Right Hand - Posterior Erythematous keratotic or waxy stuck-on papule Vertex Scalp x1 (vs early BCC) Pink tan scaly macule at vertex scalp  Assessment & Plan   INFLAMED SEBORRHEIC KERATOSIS Right Hand - Posterior Symptomatic, irritating, patient would like treated. Destruction of lesion - Right Hand - Posterior  Destruction method: cryotherapy   Informed consent: discussed and consent obtained   Lesion destroyed using liquid nitrogen: Yes   Region frozen until ice ball extended beyond lesion: Yes   Outcome: patient tolerated procedure well with no complications   Post-procedure details: wound care instructions given   Additional details:  Prior to procedure, discussed risks of blister formation, small wound, skin dyspigmentation, or rare scar following cryotherapy. Recommend Vaseline  ointment to treated areas while healing.  AK (ACTINIC KERATOSIS) Vertex Scalp x1 (vs early BCC) Actinic keratoses are precancerous spots that appear secondary to cumulative UV radiation exposure/sun exposure over time. They are chronic with expected duration over 1 year. A portion of actinic keratoses will progress to squamous cell carcinoma of the skin. It is not possible to reliably predict which spots will progress to skin cancer and so treatment is recommended to prevent development of skin cancer.  Recommend daily broad spectrum sunscreen SPF 30+ to sun-exposed areas, reapply every 2 hours as needed.  Recommend staying in the shade or wearing long sleeves, sun glasses (UVA+UVB protection) and wide brim hats (4-inch brim around the entire circumference of the hat). Call for new or changing lesions.  Recheck on follow-up. Destruction of lesion - Vertex Scalp x1 (vs early BCC)  Destruction method: cryotherapy   Informed consent: discussed and consent obtained   Lesion destroyed using liquid nitrogen: Yes   Region frozen until ice ball extended beyond lesion: Yes   Outcome: patient tolerated procedure well with no complications   Post-procedure details: wound care instructions given   Additional details:  Prior to procedure, discussed risks of blister formation, small wound, skin dyspigmentation, or rare scar following cryotherapy. Recommend Vaseline ointment to treated areas while healing.   Skin cancer screening performed today.  HISTORY OF MELANOMA. Right abdomen (1964), right lower cheek (0.3 mm Level II, 11/21)  - No evidence of recurrence today - No lymphadenopathy - Recommend regular full body skin exams - Recommend daily broad spectrum sunscreen SPF 30+ to sun-exposed areas, reapply every 2 hours as needed.  - Call if any new or changing lesions are noted  between office visits  HISTORY OF MELANOMA IN SITU.  Right ear helix, Mohs 12/10/20 with Dr. Adriana Simas, right medial knee 08/19   - No evidence of recurrence today - No lymphadenopathy  - Recommend regular full body skin exams - Recommend daily broad spectrum sunscreen SPF 30+ to sun-exposed areas, reapply every 2 hours as needed.  - Call if any new or changing lesions are noted between office visits   HISTORY OF BASAL CELL CARCINOMA OF THE SKIN - No evidence of recurrence today - Recommend regular full body skin exams - Recommend daily broad spectrum sunscreen SPF 30+ to sun-exposed areas, reapply every 2 hours as needed.  - Call if any new or changing lesions are noted between office visits   Actinic Damage - Chronic condition, secondary to cumulative UV/sun exposure - diffuse scaly erythematous macules with underlying dyspigmentation - Recommend daily broad spectrum sunscreen SPF 30+ to sun-exposed areas, reapply every 2 hours as needed.  - Staying in the shade or wearing long sleeves, sun glasses (UVA+UVB protection) and wide brim hats (4-inch brim around the entire circumference of the hat) are also recommended for sun protection.  - Call for new or changing lesions.  Lentigines, Seborrheic Keratoses, Hemangiomas - Benign normal skin lesions - Benign-appearing - Call for any changes  Melanocytic Nevi - Tan-brown and/or pink-flesh-colored symmetric macules and papules - 3 mm med dark brown macule at right upper arm - Right Spinal Upper Back 0.7 cm two toned brown macule darker medial  - Right Spinal Mid Upper Back 0.4 cm brown macule - Benign appearing on exam today - Observation - Call clinic for new or changing moles - Recommend daily use of broad spectrum spf 30+ sunscreen to sun-exposed areas.   TINEA PEDIS Exam: Scaling around toes with associated nail dystrophy.   Treatment Plan: Restart Ketoconazole 2% cream Apply to toes and between toes once a day to prevent recurrence  Nummular Dermatitis Exam: Scaly pink patches at ankles/legs   Chronic and persistent condition with duration or  expected duration over one year. Condition is symptomatic/ bothersome to patient. Not currently at goal.   Nummular dermatitis (eczema) is a chronic, relapsing, pruritic condition that can significantly affect quality of life. It is often associated with dry skin can require treatment with prescription topical medications, in addition to dry skin care.  If there is underlying atopic dermatitis and topicals are not working, then biologic injections may be necessary to control symptoms.   Treatment Plan:  Continue Mometasone cream twice daily as needed for rash on legs and ankles.    Return in about 6 months (around 08/11/2024) for TBSE, HxMM, HxMIS, HxBCC.  I, Lawson Radar, CMA, am acting as scribe for Willeen Niece, MD.   Documentation: I have reviewed the above documentation for accuracy and completeness, and I agree with the above.  Willeen Niece, MD

## 2024-02-10 DIAGNOSIS — F331 Major depressive disorder, recurrent, moderate: Secondary | ICD-10-CM | POA: Diagnosis not present

## 2024-02-10 DIAGNOSIS — F5105 Insomnia due to other mental disorder: Secondary | ICD-10-CM | POA: Diagnosis not present

## 2024-02-10 DIAGNOSIS — F419 Anxiety disorder, unspecified: Secondary | ICD-10-CM | POA: Diagnosis not present

## 2024-02-19 DIAGNOSIS — F4321 Adjustment disorder with depressed mood: Secondary | ICD-10-CM | POA: Diagnosis not present

## 2024-02-19 DIAGNOSIS — F331 Major depressive disorder, recurrent, moderate: Secondary | ICD-10-CM | POA: Diagnosis not present

## 2024-02-19 DIAGNOSIS — F5105 Insomnia due to other mental disorder: Secondary | ICD-10-CM | POA: Diagnosis not present

## 2024-02-19 DIAGNOSIS — F419 Anxiety disorder, unspecified: Secondary | ICD-10-CM | POA: Diagnosis not present

## 2024-03-03 DIAGNOSIS — F4321 Adjustment disorder with depressed mood: Secondary | ICD-10-CM | POA: Diagnosis not present

## 2024-03-03 DIAGNOSIS — F5105 Insomnia due to other mental disorder: Secondary | ICD-10-CM | POA: Diagnosis not present

## 2024-03-03 DIAGNOSIS — F331 Major depressive disorder, recurrent, moderate: Secondary | ICD-10-CM | POA: Diagnosis not present

## 2024-03-03 DIAGNOSIS — F419 Anxiety disorder, unspecified: Secondary | ICD-10-CM | POA: Diagnosis not present

## 2024-03-09 DIAGNOSIS — F5105 Insomnia due to other mental disorder: Secondary | ICD-10-CM | POA: Diagnosis not present

## 2024-03-09 DIAGNOSIS — F419 Anxiety disorder, unspecified: Secondary | ICD-10-CM | POA: Diagnosis not present

## 2024-03-09 DIAGNOSIS — F4321 Adjustment disorder with depressed mood: Secondary | ICD-10-CM | POA: Diagnosis not present

## 2024-03-09 DIAGNOSIS — F331 Major depressive disorder, recurrent, moderate: Secondary | ICD-10-CM | POA: Diagnosis not present

## 2024-03-24 ENCOUNTER — Encounter: Payer: Self-pay | Admitting: Internal Medicine

## 2024-03-24 ENCOUNTER — Ambulatory Visit: Admitting: Internal Medicine

## 2024-03-24 VITALS — BP 136/62 | Ht 68.0 in | Wt 175.2 lb

## 2024-03-24 DIAGNOSIS — R7301 Impaired fasting glucose: Secondary | ICD-10-CM | POA: Diagnosis not present

## 2024-03-24 DIAGNOSIS — F320 Major depressive disorder, single episode, mild: Secondary | ICD-10-CM

## 2024-03-24 LAB — COMPREHENSIVE METABOLIC PANEL WITH GFR
ALT: 10 U/L (ref 0–53)
AST: 10 U/L (ref 0–37)
Albumin: 4.1 g/dL (ref 3.5–5.2)
Alkaline Phosphatase: 61 U/L (ref 39–117)
BUN: 20 mg/dL (ref 6–23)
CO2: 30 meq/L (ref 19–32)
Calcium: 8.7 mg/dL (ref 8.4–10.5)
Chloride: 102 meq/L (ref 96–112)
Creatinine, Ser: 1.15 mg/dL (ref 0.40–1.50)
GFR: 54.7 mL/min — ABNORMAL LOW (ref >60.00)
Glucose, Bld: 92 mg/dL (ref 70–99)
Potassium: 4.4 meq/L (ref 3.5–5.1)
Sodium: 138 meq/L (ref 135–145)
Total Bilirubin: 0.6 mg/dL (ref 0.2–1.2)
Total Protein: 5.9 g/dL — ABNORMAL LOW (ref 6.0–8.3)

## 2024-03-24 MED ORDER — MIRTAZAPINE 7.5 MG PO TABS: 7.5000 mg | ORAL_TABLET | Freq: Every day | ORAL | 1 refills | Status: DC

## 2024-03-24 NOTE — Patient Instructions (Addendum)
 I agree that you are depressed. You have lost 15 lbs in 10 months which is not good.  I want you to have a Trial of mirtazipine  , which is an antidepressant.  Starting dose 7.5 mg   30 minutes  before you go to bed.    Stop tylenol  PM for now .  Use plain tylenol  if needed for arthritis pain   Return in one month

## 2024-03-24 NOTE — Progress Notes (Signed)
 Subjective:  Patient ID: Jon Epps., male    DOB: [birth year]  Age: 89 y.o. MRN: [id number]  CC: The primary encounter diagnosis was Impaired fasting glucose. A diagnosis of Depression, major, single episode, mild (HCC) was also pertinent to this visit.   HPI Jon Milic. presents for  Chief Complaint  Patient presents with   Medical Management of Chronic Issues   1) follow up on depression triggered by physical separation from wife who now lives in Memory Care due to advanced dementia.  Has not been using Taking trazodone  for insomnia,   using tylenol  PM .  He  saw a therapist in March after seeing Jon Sherman. Doesn't think the visits have been helpful so is not pursuing.    He has had a `15 lb weight loss since June 2024 . Eats a hearty breakfast,  light lunch , usually yogurt ,  crackrs.  Supper is a frozen microwaveable dinner or a dinner at [facility].  Visits wife 5-6 days per week.  He is not suicidal but is frustrated ad states that he Doesn't know "why he is living so long?  Nobody in my family has lived this long "   Outpatient Medications Prior to Visit  Medication Sig Dispense Refill   ciclopirox  (LOPROX ) 0.77 % cream Apply to feet and between toes twice a day. 90 g 2   cyanocobalamin  (VITAMIN B12) 1000 MCG/ML injection Inject 1 mL once a  month 3 mL 3   metroNIDAZOLE  (METROGEL ) 0.75 % gel APPLY A THIN COAT TO THE FACE EVERY DAY 45 g 11   mometasone  (ELOCON ) 0.1 % cream Apply twice daily to affected areas at ankles, legs. Follow with moisturizer. Avoid applying to face, groin, and axilla. Use as directed. Long-term use can cause thinning of the skin. 45 g 1   Multiple Vitamins-Minerals (PRESERVISION AREDS 2) CAPS Take 1 capsule by mouth 2 (two) times daily.     sertraline (ZOLOFT) 25 MG tablet Take 25 mg by mouth daily.     traZODone  (DESYREL ) 50 MG tablet Take 2 tablets (100 mg total) by mouth at bedtime. 180 tablet 3   Vitamin D , Ergocalciferol , (DRISDOL ) 1.25 MG  (50000 UNIT) CAPS capsule TAKE 1 CAPSULE BY MOUTH ONE TIME PER WEEK 12 capsule 0   No facility-administered medications prior to visit.    Review of Systems;  Patient denies headache, fevers, malaise, unintentional weight loss, skin rash, eye pain, sinus congestion and sinus pain, sore throat, dysphagia,  hemoptysis , cough, dyspnea, wheezing, chest pain, palpitations, orthopnea, edema, abdominal pain, nausea, melena, diarrhea, constipation, flank pain, dysuria, hematuria, urinary  Frequency, nocturia, numbness, tingling, seizures,  Focal weakness, Loss of consciousness,  Tremor, insomnia, depression, anxiety, and suicidal ideation.      Objective:  BP 136/62   Ht 5\' 8"  (1.727 m)   Wt 175 lb 3.2 oz (79.5 kg)   SpO2 98%   BMI 26.64 kg/m   BP Readings from Last 3 Encounters:  03/24/24 136/62  01/20/24 124/78  12/21/23 126/74    Wt Readings from Last 3 Encounters:  03/24/24 175 lb 3.2 oz (79.5 kg)  01/20/24 179 lb 12.8 oz (81.6 kg)  12/21/23 175 lb 6.4 oz (79.6 kg)    Physical Exam  Lab Results  Component Value Date   HGBA1C 5.6 10/06/2023    Lab Results  Component Value Date   CREATININE 1.15 03/24/2024   CREATININE 1.21 10/06/2023   CREATININE 1.16 08/20/2022    Lab Results  Component Value Date   WBC 5.7 10/06/2023   HGB 14.4 10/06/2023   HCT 43.0 10/06/2023   PLT 233.0 10/06/2023   GLUCOSE 92 03/24/2024   CHOL 162 10/06/2023   TRIG 119.0 10/06/2023   HDL 53.40 10/06/2023   LDLDIRECT 88.0 10/06/2023   LDLCALC 85 10/06/2023   ALT 10 03/24/2024   AST 10 03/24/2024   NA 138 03/24/2024   K 4.4 03/24/2024   CL 102 03/24/2024   CREATININE 1.15 03/24/2024   BUN 20 03/24/2024   CO2 30 03/24/2024   TSH 1.79 10/06/2023   HGBA1C 5.6 10/06/2023    DG Chest 2 View Result Date: 12/07/2023 CLINICAL DATA:  Cough EXAM: CHEST - 2 VIEW COMPARISON:  04/08/2018 FINDINGS: Mild central peribronchial thickening. No confluent airspace infiltrate. Heart size normal. No  effusion. Regional bones unremarkable. IMPRESSION: Mild central peribronchial thickening suggesting asthma, bronchitis, or viral syndrome. Electronically Signed   By: Nicoletta Barrier M.D.   On: 12/07/2023 14:36    Assessment & Plan:  .Impaired fasting glucose -     Comprehensive metabolic panel with GFR  Depression, major, single episode, mild (HCC) Assessment & Plan: Secondary to separation from his wife due to her worsening ementia and need for skilled care.   Difficulty staying asleep.,  losing weight due to lack of interest in food.  Adding mirtazapine     Other orders -     Mirtazapine ; Take 1 tablet (7.5 mg total) by mouth at bedtime.  Dispense: 90 tablet; Refill: 1     I spent 34 minutes on the day of this face to face encounter reviewing patient's  most recent visit with cardiology,  nephrology,  and neurology,  prior relevant surgical and non surgical procedures, recent  labs and imaging studies, counseling on weight management,  reviewing the assessment and plan with patient, and post visit ordering and reviewing of  diagnostics and therapeutics with patient  .   Follow-up: Return in about 4 weeks (around 04/21/2024) for depression .   Thersia Flax, MD

## 2024-03-26 ENCOUNTER — Encounter: Payer: Self-pay | Admitting: Internal Medicine

## 2024-03-26 NOTE — Assessment & Plan Note (Signed)
 Secondary to separation from his wife due to her worsening ementia and need for skilled care.   Difficulty staying asleep.,  losing weight due to lack of interest in food.  Adding mirtazapine 

## 2024-04-06 DIAGNOSIS — F419 Anxiety disorder, unspecified: Secondary | ICD-10-CM | POA: Diagnosis not present

## 2024-04-06 DIAGNOSIS — G4700 Insomnia, unspecified: Secondary | ICD-10-CM | POA: Diagnosis not present

## 2024-04-06 DIAGNOSIS — F331 Major depressive disorder, recurrent, moderate: Secondary | ICD-10-CM | POA: Diagnosis not present

## 2024-04-08 ENCOUNTER — Ambulatory Visit: Payer: Self-pay

## 2024-04-08 NOTE — Telephone Encounter (Signed)
  Chief Complaint: urinary and stool incontinence Symptoms: episodes of urinary and stool incontinence Frequency: "going on for awhile" per patient Pertinent Negatives: Patient denies abdominal pain, nausea, vomiting Disposition: [] ED /[] Urgent Care (no appt availability in office) / [] Appointment(In office/virtual)/ []  [facility]/ [] Home Care/ [] Refused Recommended Disposition /[]  Mobile Bus/ [x]  Follow-up with PCP Additional Notes: patient calling with concerns for urinary and stool incontinence. Patient states he has been having 2 episodes of both urinary and stool incontinence almost every day per patient. Patient states he knows when its going to happen and then sometimes he doesn't. Patient is asking if there is medication that can be called in to help with the incontinence concerns. Offered an acute visit but patient would like to see if medication can be called in. Patient is encouraged to get depends disposable underwear to wear for the incontinence at this time. Patient is asking for a call back from clinic today. Patient verbalized understanding and all questions answered.    Copied from CRM [id number]. Topic: Clinical - Red Word Triage >> Apr 08, 2024 11:46 AM Lajean Pike wrote: Red Word that prompted transfer to Nurse Triage: Urine leaking out, and something leaking out of rectum. Sometimes he knows it and sometimes he doesn't. Asking what can control it. Reason for Disposition  [1] Can't control passage of urine (i.e., urinary incontinence, wetting self) AND [2] present > 2 weeks  Answer Assessment - Initial Assessment Questions 1. SYMPTOM: "What's the main symptom you're concerned about?" (e.g., frequency, incontinence)     Frequency along with incontinence 2. ONSET: "When did the  frequency along with incontinence  start?"     Going on for awhile 3. PAIN: "Is there any pain?" If Yes, ask: "How bad is it?" (Scale: 1-10; mild, moderate, severe)     no 4. CAUSE:  "What do you think is causing the symptoms?"     unsure 5. OTHER SYMPTOMS: "Do you have any other symptoms?" (e.g., blood in urine, fever, flank pain, pain with urination)     no  Protocols used: Urinary Symptoms-A-AH

## 2024-04-12 ENCOUNTER — Telehealth: Payer: Self-pay

## 2024-04-12 ENCOUNTER — Ambulatory Visit: Admitting: Nurse Practitioner

## 2024-04-12 NOTE — Telephone Encounter (Signed)
 Phone call to check on pt due to missed appointment.  Pt reported that he did not realize that he missed his appointment and then checked his calendar.  He apologized for missing the appointment. I rescheduled the pt for an appointment on Friday 04/15/24 at 1:40p.

## 2024-04-12 NOTE — Telephone Encounter (Signed)
 Noted! Thank you

## 2024-04-15 ENCOUNTER — Ambulatory Visit: Admitting: Nurse Practitioner

## 2024-04-15 ENCOUNTER — Encounter: Payer: Self-pay | Admitting: Nurse Practitioner

## 2024-04-15 VITALS — BP 118/76 | HR 58 | Temp 98.3°F | Ht 68.0 in | Wt 173.0 lb

## 2024-04-15 DIAGNOSIS — R159 Full incontinence of feces: Secondary | ICD-10-CM

## 2024-04-15 DIAGNOSIS — R32 Unspecified urinary incontinence: Secondary | ICD-10-CM

## 2024-04-15 LAB — POC URINALSYSI DIPSTICK (AUTOMATED)
Bilirubin, UA: NEGATIVE
Blood, UA: NEGATIVE
Glucose, UA: NEGATIVE
Ketones, UA: NEGATIVE
Leukocytes, UA: NEGATIVE
Nitrite, UA: NEGATIVE
Protein, UA: NEGATIVE
Spec Grav, UA: 1.025 (ref 1.010–1.025)
Urobilinogen, UA: 0.2 U/dL
pH, UA: 6 (ref 5.0–8.0)

## 2024-04-15 NOTE — Progress Notes (Signed)
 Established Patient Office Visit  Subjective:  Patient ID: Jon Sherman., male    DOB: 02/09/1928  Age: 89 y.o. MRN: [id number]  CC:  Chief Complaint  Patient presents with   Acute Visit    Urinary incontinence     HPI  Jon Sherman. presents for urinary incontinence from last 3 to 4 months.  Patient states that he has tried medication in the past but the medication makes him drowsy.   He has been experiencing urinary urgency and leakage couple of times a week mostly at night and has occasional bowel incontinence.  He denies any difficulty starting a stream, dysuria.  HPI   Past Medical History:  Diagnosis Date   Basal cell carcinoma 06/29/2018   right forehead   Basal cell carcinoma 01/06/2023   L nasal ala, MOHs completed 04/02/23   Basal cell carcinoma 07/29/2023   right preauricular, EDC   Cancer (HCC) 1965   melanoma, LNs resected   COPD (chronic obstructive pulmonary disease) (HCC)    Infectious colitis 2000   requiring hospitalization   Influenza 02/18/2018   Melanoma (HCC) 1964   right abdomen   Melanoma in situ (HCC) 10/09/2020   right ear helix, MOHs 12/10/20 with Dr. Bluford   Melanoma in situ of right lower extremity (HCC) 07/05/2018   right medial knee. Excised, margins free.   Melanoma of face (HCC) 08/20/2020   right lower cheek 0.53mm, Clarks Level II, Mohs with Dr. Bluford 09/28/20   Rosacea    Vertigo    1 episode (2019)    Past Surgical History:  Procedure Laterality Date   basal cell removal on nose  03/2023   on nose   CATARACT EXTRACTION W/PHACO Right 06/29/2019   Procedure: CATARACT EXTRACTION PHACO AND INTRAOCULAR LENS PLACEMENT (IOC) RIGHT;  Surgeon: Mittie Gaskin, MD;  Location: [facility];  Service: Ophthalmology;  Laterality: Right;   CATARACT EXTRACTION W/PHACO Left 08/31/2019   Procedure: CATARACT EXTRACTION PHACO AND INTRAOCULAR LENS PLACEMENT (IOC) LEFT 02:06.7  25.2%  32.01;  Surgeon: Mittie Gaskin,  MD;  Location: [facility];  Service: Ophthalmology;  Laterality: Left;   MELANOMA EXCISION WITH SENTINEL LYMPH NODE BIOPSY     SOFT TISSUE TUMOR RESECTION  11/18/1983    Family History  Problem Relation Age of Onset   Mental illness Mother    Heart failure Mother    Heart failure Father    Rheumatic fever Sister    Cancer Neg Hx    Drug abuse Neg Hx     Social History   Socioeconomic History   Marital status: Married    Spouse name: Not on file   Number of children: Not on file   Years of education: Not on file   Highest education level: Not on file  Occupational History   Not on file  Tobacco Use   Smoking status: Former    Current packs/day: 0.00    Average packs/day: 1 pack/day for 10.0 years (10.0 ttl pk-yrs)    Types: Cigarettes    Start date: 02/23/1952    Quit date: 02/22/1962    Years since quitting: 62.2   Smokeless tobacco: Never  Vaping Use   Vaping status: Never Used  Substance and Sexual Activity   Alcohol use: Yes    Alcohol/week: 7.0 standard drinks of alcohol    Types: 7 Standard drinks or equivalent per week    Comment: Occasional wine and beer with meal    Drug use: No  Sexual activity: Never  Other Topics Concern   Not on file  Social History Narrative   Married    Lives in [place] >10 years as of 02/2018    Social Drivers of Health   Financial Resource Strain: Low Risk  (05/15/2023)   Overall Financial Resource Strain (CARDIA)    Difficulty of Paying Living Expenses: Not hard at all  Food Insecurity: No Food Insecurity (05/15/2023)   Hunger Vital Sign    Worried About Running Out of Food in the Last Year: Never true    Ran Out of Food in the Last Year: Never true  Transportation Needs: No Transportation Needs (05/15/2023)   PRAPARE - Administrator, Civil Service (Medical): No    Lack of Transportation (Non-Medical): No  Physical Activity: Insufficiently Active (05/15/2023)   Exercise Vital Sign    Days of Exercise per  Week: 3 days    Minutes of Exercise per Session: 20 min  Stress: No Stress Concern Present (05/15/2023)   [employer] of Occupational Health - Occupational Stress Questionnaire    Feeling of Stress : Only a little  Social Connections: Socially Integrated (05/15/2023)   Social Connection and Isolation Panel    Frequency of Communication with Friends and Family: More than three times a week    Frequency of Social Gatherings with Friends and Family: More than three times a week    Attends Religious Services: More than 4 times per year    Active Member of [employer] or Organizations: No    Attends Engineer, structural: More than 4 times per year    Marital Status: Married  Catering manager Violence: Not At Risk (05/15/2023)   Humiliation, Afraid, Rape, and Kick questionnaire    Fear of Current or Ex-Partner: No    Emotionally Abused: No    Physically Abused: No    Sexually Abused: No     Outpatient Medications Prior to Visit  Medication Sig Dispense Refill   ciclopirox  (LOPROX ) 0.77 % cream Apply to feet and between toes twice a day. 90 g 2   cyanocobalamin  (VITAMIN B12) 1000 MCG/ML injection Inject 1 mL once a  month 3 mL 3   mometasone  (ELOCON ) 0.1 % cream Apply twice daily to affected areas at ankles, legs. Follow with moisturizer. Avoid applying to face, groin, and axilla. Use as directed. Long-term use can cause thinning of the skin. 45 g 1   Multiple Vitamins-Minerals (PRESERVISION AREDS 2) CAPS Take 1 capsule by mouth 2 (two) times daily.     metroNIDAZOLE  (METROGEL ) 0.75 % gel APPLY A THIN COAT TO THE FACE EVERY DAY 45 g 11   mirtazapine  (REMERON ) 7.5 MG tablet Take 1 tablet (7.5 mg total) by mouth at bedtime. 90 tablet 1   sertraline  (ZOLOFT ) 25 MG tablet Take 25 mg by mouth daily.     traZODone  (DESYREL ) 50 MG tablet Take 2 tablets (100 mg total) by mouth at bedtime. 180 tablet 3   Vitamin D , Ergocalciferol , (DRISDOL ) 1.25 MG (50000 UNIT) CAPS capsule TAKE 1 CAPSULE BY  MOUTH ONE TIME PER WEEK 12 capsule 0   No facility-administered medications prior to visit.    Allergies  Allergen Reactions   Demerol     Caused patient to feel nauseated   Meperidine     Other reaction(s): Unknown    ROS Review of Systems Negative unless indicated in HPI.    Objective:     Physical Exam Constitutional:      Appearance:  Normal appearance.   Cardiovascular:     Rate and Rhythm: Normal rate and regular rhythm.     Pulses: Normal pulses.     Heart sounds: Normal heart sounds.  Abdominal:     General: Bowel sounds are normal.     Palpations: Abdomen is soft.     Tenderness: There is no abdominal tenderness. There is no right CVA tenderness or left CVA tenderness.   Musculoskeletal:     Cervical back: Normal range of motion.   Neurological:     General: No focal deficit present.     Mental Status: He is alert. Mental status is at baseline.   Psychiatric:        Mood and Affect: Mood normal.        Behavior: Behavior normal.        Thought Content: Thought content normal.        Judgment: Judgment normal.     BP 118/76   Pulse (!) 58   Temp 98.3 F (36.8 C)   Ht 5' 8 (1.727 m)   Wt 173 lb (78.5 kg)   SpO2 98%   BMI 26.30 kg/m  Wt Readings from Last 3 Encounters:  05/05/24 172 lb 12.8 oz (78.4 kg)  04/19/24 173 lb (78.5 kg)  04/15/24 173 lb (78.5 kg)     Health Maintenance  Topic Date Due   Zoster Vaccines- Shingrix (2 of 2) 04/08/2018   COVID-19 Vaccine (8 - 2024-25 season) 07/19/2023   Medicare Annual Wellness (AWV)  05/14/2024   DTaP/Tdap/Td (3 - Td or Tdap) 01/19/2025 (Originally 07/27/2023)   INFLUENZA VACCINE  06/17/2024   Pneumococcal Vaccine: 50+ Years  Completed   Hepatitis B Vaccines  Aged Out   HPV VACCINES  Aged Out   Meningococcal B Vaccine  Aged Out    There are no preventive care reminders to display for this patient.  Lab Results  Component Value Date   TSH 1.79 10/06/2023   Lab Results  Component Value  Date   WBC 5.7 10/06/2023   HGB 14.4 10/06/2023   HCT 43.0 10/06/2023   MCV 96.4 10/06/2023   PLT 233.0 10/06/2023   Lab Results  Component Value Date   NA 138 03/24/2024   K 4.4 03/24/2024   CO2 30 03/24/2024   GLUCOSE 92 03/24/2024   BUN 20 03/24/2024   CREATININE 1.15 03/24/2024   BILITOT 0.6 03/24/2024   ALKPHOS 61 03/24/2024   AST 10 03/24/2024   ALT 10 03/24/2024   PROT 5.9 (L) 03/24/2024   ALBUMIN 4.1 03/24/2024   CALCIUM 8.7 03/24/2024   GFR 54.70 (L) 03/24/2024   Lab Results  Component Value Date   CHOL 162 10/06/2023   Lab Results  Component Value Date   HDL 53.40 10/06/2023   Lab Results  Component Value Date   LDLCALC 85 10/06/2023   Lab Results  Component Value Date   TRIG 119.0 10/06/2023   Lab Results  Component Value Date   CHOLHDL 3 10/06/2023   Lab Results  Component Value Date   HGBA1C 5.6 10/06/2023      Assessment & Plan:  Urinary incontinence, unspecified type Assessment & Plan: Pt have been experiencing urgency and leakage a couple of times a week, mostly at night.  UA negative. -Perform pelvic floor exercises. -Decrease evening fluid intake. -Visit the bathroom every two hours. -Use protective garments at night  Orders: -     POCT Urinalysis Dipstick (Automated)  Incontinence of feces, unspecified fecal incontinence  type Assessment & Plan: Pt have occasional bowel incontinence, especially with diarrhea, and sometimes constipation. -Increase dietary fiber. -Avoid artificial sweeteners and limit coffee. -Establish regular bowel habits and routine bathroom visits     Follow-up: No follow-ups on file.   Takeysha Bonk, NP

## 2024-04-15 NOTE — Patient Instructions (Addendum)
 URINARY INCONTINENCE: You have been experiencing urgency and leakage a couple of times a week, mostly at night. -Use protective garments at night. -Perform pelvic floor exercises. -Decrease evening fluid intake. -Visit the bathroom every two hours.  FECAL INCONTINENCE: You have occasional bowel incontinence, especially with diarrhea, and sometimes constipation. -Increase dietary fiber. -Avoid artificial sweeteners and limit coffee. -Establish regular bowel habits and routine bathroom visits

## 2024-04-19 ENCOUNTER — Ambulatory Visit: Payer: Medicare PPO | Admitting: Internal Medicine

## 2024-04-19 ENCOUNTER — Encounter: Payer: Self-pay | Admitting: Internal Medicine

## 2024-04-19 VITALS — BP 138/60 | HR 75 | Ht 68.0 in | Wt 173.0 lb

## 2024-04-19 DIAGNOSIS — R634 Abnormal weight loss: Secondary | ICD-10-CM

## 2024-04-19 DIAGNOSIS — E559 Vitamin D deficiency, unspecified: Secondary | ICD-10-CM | POA: Diagnosis not present

## 2024-04-19 DIAGNOSIS — E538 Deficiency of other specified B group vitamins: Secondary | ICD-10-CM | POA: Diagnosis not present

## 2024-04-19 DIAGNOSIS — J431 Panlobular emphysema: Secondary | ICD-10-CM

## 2024-04-19 DIAGNOSIS — F320 Major depressive disorder, single episode, mild: Secondary | ICD-10-CM | POA: Diagnosis not present

## 2024-04-19 MED ORDER — SERTRALINE HCL 50 MG PO TABS: 50.0000 mg | ORAL_TABLET | Freq: Every day | ORAL | 3 refills | Status: DC

## 2024-04-19 MED ORDER — VITAMIN D (ERGOCALCIFEROL) 1.25 MG (50000 UNIT) PO CAPS: 50000.0000 [IU] | ORAL_CAPSULE | ORAL | 3 refills | Status: DC

## 2024-04-19 NOTE — Assessment & Plan Note (Signed)
 Secondary to separation from his wife due to her worsening ementia and need for skilled care.   Difficulty staying asleep.,  losing weight due to lack of interest in food.  He did not start mirtazapine .  Will increase sertraline

## 2024-04-19 NOTE — Progress Notes (Signed)
 Subjective:  Patient ID: Jon Sherman., male    DOB: 09/11/1928  Age: 89 y.o. MRN: [id number]  CC: The primary encounter diagnosis was Vitamin D  deficiency. Diagnoses of B12 deficiency, Weight loss, unintentional, Depression, major, single episode, mild (HCC), and Panlobular emphysema (HCC) were also pertinent to this visit.   HPI Jon Sherman. presents for  Chief Complaint  Patient presents with   Medical Management of Chronic Issues   1) evaluated by NP several weeks ago for urinary incontinence .  UA normal except for SG 1.025  2) low protein stores: noted on CMET last month  in the context of weight loss.  Remeron  was prescribed  started last month,  but did not start the medication . eight has plateaued.  No longer taking trazodone  , using tylenol  PM to help him sleep.  Taking AREDS for eyes and weekly vitamin D    3) Depression:  siituational. due to wife's estrangement as a result of her dementia requiring placement in [facility].  Frsutrated at "this long life he  and his wfie "  Outpatient Medications Prior to Visit  Medication Sig Dispense Refill   ciclopirox  (LOPROX ) 0.77 % cream Apply to feet and between toes twice a day. 90 g 2   cyanocobalamin  (VITAMIN B12) 1000 MCG/ML injection Inject 1 mL once a  month 3 mL 3   diphenhydramine-acetaminophen  (TYLENOL  PM) 25-500 MG TABS tablet Take 1 tablet by mouth at bedtime as needed.     metroNIDAZOLE  (METROGEL ) 0.75 % gel APPLY A THIN COAT TO THE FACE EVERY DAY 45 g 11   mometasone  (ELOCON ) 0.1 % cream Apply twice daily to affected areas at ankles, legs. Follow with moisturizer. Avoid applying to face, groin, and axilla. Use as directed. Long-term use can cause thinning of the skin. 45 g 1   Multiple Vitamins-Minerals (PRESERVISION AREDS 2) CAPS Take 1 capsule by mouth 2 (two) times daily.     mirtazapine  (REMERON ) 7.5 MG tablet Take 1 tablet (7.5 mg total) by mouth at bedtime. 90 tablet 1   sertraline (ZOLOFT) 25 MG  tablet Take 25 mg by mouth daily.     traZODone  (DESYREL ) 50 MG tablet Take 2 tablets (100 mg total) by mouth at bedtime. 180 tablet 3   Vitamin D , Ergocalciferol , (DRISDOL ) 1.25 MG (50000 UNIT) CAPS capsule TAKE 1 CAPSULE BY MOUTH ONE TIME PER WEEK 12 capsule 0   No facility-administered medications prior to visit.    Review of Systems;  Patient denies headache, fevers, malaise, unintentional weight loss, skin rash, eye pain, sinus congestion and sinus pain, sore throat, dysphagia,  hemoptysis , cough, dyspnea, wheezing, chest pain, palpitations, orthopnea, edema, abdominal pain, nausea, melena, diarrhea, constipation, flank pain, dysuria, hematuria, urinary  Frequency, nocturia, numbness, tingling, seizures,  Focal weakness, Loss of consciousness,  Tremor, insomnia, depression, anxiety, and suicidal ideation.      Objective:  BP 138/60   Pulse 75   Ht 5\' 8"  (1.727 m)   Wt 173 lb (78.5 kg)   SpO2 97%   BMI 26.30 kg/m   BP Readings from Last 3 Encounters:  04/19/24 138/60  04/15/24 118/76  03/24/24 136/62    Wt Readings from Last 3 Encounters:  04/19/24 173 lb (78.5 kg)  04/15/24 173 lb (78.5 kg)  03/24/24 175 lb 3.2 oz (79.5 kg)    Physical Exam Vitals reviewed.  Constitutional:      General: He is not in acute distress.    Appearance: Normal appearance. He is  normal weight. He is not ill-appearing, toxic-appearing or diaphoretic.  HENT:     Head: Normocephalic.  Eyes:     General: No scleral icterus.       Right eye: No discharge.        Left eye: No discharge.     Conjunctiva/sclera: Conjunctivae normal.  Cardiovascular:     Rate and Rhythm: Normal rate and regular rhythm.     Heart sounds: Normal heart sounds.  Pulmonary:     Effort: Pulmonary effort is normal. No respiratory distress.     Breath sounds: Normal breath sounds.  Musculoskeletal:        General: Normal range of motion.     Cervical back: Normal range of motion.  Skin:    General: Skin is warm  and dry.  Neurological:     General: No focal deficit present.     Mental Status: He is alert and oriented to person, place, and time. Mental status is at baseline.  Psychiatric:        Mood and Affect: Mood normal.        Behavior: Behavior normal.        Thought Content: Thought content normal.        Judgment: Judgment normal.    Lab Results  Component Value Date   HGBA1C 5.6 10/06/2023    Lab Results  Component Value Date   CREATININE 1.15 03/24/2024   CREATININE 1.21 10/06/2023   CREATININE 1.16 08/20/2022    Lab Results  Component Value Date   WBC 5.7 10/06/2023   HGB 14.4 10/06/2023   HCT 43.0 10/06/2023   PLT 233.0 10/06/2023   GLUCOSE 92 03/24/2024   CHOL 162 10/06/2023   TRIG 119.0 10/06/2023   HDL 53.40 10/06/2023   LDLDIRECT 88.0 10/06/2023   LDLCALC 85 10/06/2023   ALT 10 03/24/2024   AST 10 03/24/2024   NA 138 03/24/2024   K 4.4 03/24/2024   CL 102 03/24/2024   CREATININE 1.15 03/24/2024   BUN 20 03/24/2024   CO2 30 03/24/2024   TSH 1.79 10/06/2023   HGBA1C 5.6 10/06/2023    DG Chest 2 View Result Date: 12/07/2023 CLINICAL DATA:  Cough EXAM: CHEST - 2 VIEW COMPARISON:  04/08/2018 FINDINGS: Mild central peribronchial thickening. No confluent airspace infiltrate. Heart size normal. No effusion. Regional bones unremarkable. IMPRESSION: Mild central peribronchial thickening suggesting asthma, bronchitis, or viral syndrome. Electronically Signed   By: Nicoletta Barrier M.D.   On: 12/07/2023 14:36    Assessment & Plan:  .Vitamin D  deficiency -     VITAMIN D  25 Hydroxy (Vit-D Deficiency, Fractures); Future  B12 deficiency -     B12 and Folate Panel; Future  Weight loss, unintentional -     Comprehensive metabolic panel with GFR; Future -     TSH; Future  Depression, major, single episode, mild (HCC) Assessment & Plan: Secondary to separation from his wife due to her worsening ementia and need for skilled care.   Difficulty staying asleep.,  losing  weight due to lack of interest in food.  He did not start mirtazapine .  Will increase sertraline    Panlobular emphysema (HCC) Assessment & Plan: Noted on chest CT done in 2019.  He has a remote tobacco history and remains  asymptomatic.    Other orders -     Vitamin D  (Ergocalciferol ); Take 1 capsule (50,000 Units total) by mouth every 7 (seven) days.  Dispense: 12 capsule; Refill: 3 -     Sertraline  HCl; Take 1 tablet (50 mg total) by mouth daily.  Dispense: 30 tablet; Refill: 3     Follow-up: Return in about 6 months (around 10/19/2024) for DEPRESSION, WEIGHT LOSS .   Thersia Flax, MD

## 2024-04-19 NOTE — Patient Instructions (Signed)
  I have refilled your Vitamin D  to continue taking ONCE A WEEK  I HAVE refilled sertraline at a slightly higher dose to help your depression.  Continue taking it RIGHT AFTER BREAKFAST   HAPPY BIRTHDAY!  OUR IS NOT TO WONDER WHY WE ARE STILL HERE,  BUT WE CAN TRUST THAT GOD WILL CALL US  HOME WHEN HE IS READY FOR US

## 2024-04-19 NOTE — Assessment & Plan Note (Signed)
 Noted on chest CT done in 2019.  He has a remote tobacco history and remains  asymptomatic.

## 2024-04-21 ENCOUNTER — Ambulatory Visit: Admitting: Internal Medicine

## 2024-04-29 DIAGNOSIS — Z0100 Encounter for examination of eyes and vision without abnormal findings: Secondary | ICD-10-CM | POA: Diagnosis not present

## 2024-04-29 DIAGNOSIS — H353231 Exudative age-related macular degeneration, bilateral, with active choroidal neovascularization: Secondary | ICD-10-CM | POA: Diagnosis not present

## 2024-04-29 DIAGNOSIS — Z961 Presence of intraocular lens: Secondary | ICD-10-CM | POA: Diagnosis not present

## 2024-05-05 ENCOUNTER — Other Ambulatory Visit: Payer: Self-pay | Admitting: Dermatology

## 2024-05-05 ENCOUNTER — Ambulatory Visit

## 2024-05-05 VITALS — BP 104/58 | HR 61 | Temp 98.1°F | Ht 68.0 in | Wt 172.8 lb

## 2024-05-05 DIAGNOSIS — R059 Cough, unspecified: Secondary | ICD-10-CM

## 2024-05-05 DIAGNOSIS — L719 Rosacea, unspecified: Secondary | ICD-10-CM

## 2024-05-05 NOTE — Assessment & Plan Note (Addendum)
 Cough resolved.   The patient appears to be at his baseline health status with no new symptoms or concerns. The reason for today's visit is unclear to the patient, and there are no charges for today's visit. Recommend follow-up with PCP or with our office if new symptoms develop. No further action required at this time unless new issues arise.

## 2024-05-05 NOTE — Progress Notes (Signed)
   Acute Office Visit  Subjective:    Patient ID: Jon Meester., male    DOB: [birth year], 89 y.o.   MRN: [id number]  Chief Complaint  Patient presents with   Gait Problem   HPI 1) Patient's appointment was scheduled by E2C2 for evaluation of cough. However, patient  reports cough has significantly declined and is not bothersome. He is unsure why this appointment was made.  Appointment note also states of Balance concerns. Again patient reports this has not changed from his baseline. He has been using a caine for ambulation for about 3 years. No recent fall. No chest pain, fever, chills.  He lives at [facility] and wife is at memory care unit there. He is in the process of transitioning to assisted living within [facility].    ROS As per HPI    Objective:    BP (!) 104/58   Pulse 61   Temp 98.1 F (36.7 C) (Oral)   Ht 5' 8 (1.727 m)   Wt 172 lb 12.8 oz (78.4 kg)   SpO2 98%   BMI 26.27 kg/m    Physical Exam HENT:     Mouth/Throat:     Mouth: Mucous membranes are moist.  Pulmonary:     Breath sounds: No wheezing or rales.   Musculoskeletal:     Cervical back: Normal range of motion and neck supple.  Lymphadenopathy:     Cervical: No cervical adenopathy.   Neurological:     Mental Status: He is alert and oriented to person, place, and time.     Comments: Antalgic gait, using caine for ambulation.     No results found for any visits on 05/05/24.     Assessment & Plan:  Cough in adult Assessment & Plan: Cough resolved.   The patient appears to be at his baseline health status with no new symptoms or concerns. The reason for today's visit is unclear to the patient, and there are no charges for today's visit. Recommend follow-up with PCP or with our office if new symptoms develop. No further action required at this time unless new issues arise.     Return for As scheduled with PCP on 10/18/24 or sooner if new symptoms arise .  Jacklin Mascot, MD

## 2024-05-05 NOTE — Patient Instructions (Addendum)
 Follow up with PCP Dr. Madelon Scheuermann as scheduled  in December.

## 2024-05-12 ENCOUNTER — Other Ambulatory Visit: Payer: Self-pay | Admitting: Internal Medicine

## 2024-05-16 ENCOUNTER — Telehealth: Payer: Self-pay

## 2024-05-16 DIAGNOSIS — R32 Unspecified urinary incontinence: Secondary | ICD-10-CM | POA: Insufficient documentation

## 2024-05-16 NOTE — Telephone Encounter (Signed)
FL2 form has been placed in red folder.

## 2024-05-16 NOTE — Assessment & Plan Note (Addendum)
 Pt have been experiencing urgency and leakage a couple of times a week, mostly at night.  UA negative. -Perform pelvic floor exercises. -Decrease evening fluid intake. -Visit the bathroom every two hours. -Use protective garments at night

## 2024-05-16 NOTE — Telephone Encounter (Signed)
 error

## 2024-05-16 NOTE — Assessment & Plan Note (Signed)
 Pt have occasional bowel incontinence, especially with diarrhea, and sometimes constipation. -Increase dietary fiber. -Avoid artificial sweeteners and limit coffee. -Establish regular bowel habits and routine bathroom visits

## 2024-05-17 ENCOUNTER — Telehealth: Payer: Self-pay | Admitting: *Deleted

## 2024-05-17 ENCOUNTER — Telehealth: Payer: Self-pay

## 2024-05-17 ENCOUNTER — Ambulatory Visit: Payer: Medicare PPO | Admitting: *Deleted

## 2024-05-17 VITALS — Ht 68.0 in | Wt 173.0 lb

## 2024-05-17 DIAGNOSIS — Z0000 Encounter for general adult medical examination without abnormal findings: Secondary | ICD-10-CM | POA: Diagnosis not present

## 2024-05-17 NOTE — Patient Instructions (Signed)
 [name] , Thank you for taking time out of your busy schedule to complete your Annual Wellness Visit with me. I enjoyed our conversation and look forward to speaking with you again next year. I, as well as your care team,  appreciate your ongoing commitment to your health goals. Please review the following plan we discussed and let me know if I can assist you in the future. Your Game plan/ To Do List    Referrals: If you haven't heard from the office you've been referred to, please reach out to them at the phone provided.  Remember to update your Tetanus and shingles vaccines. Follow up Visits: Next Medicare AWV with our clinical staff: 05/23/25 @ 11:30   Have you seen your provider in the last 6 months (3 months if uncontrolled diabetes)? Yes Next Office Visit with your provider: 10/18/2024  Clinician Recommendations:  Aim for 30 minutes of exercise or brisk walking, 6-8 glasses of water, and 5 servings of fruits and vegetables each day.       This is a list of the screening recommended for you and due dates:  Health Maintenance  Topic Date Due   Zoster (Shingles) Vaccine (2 of 2) 04/08/2018   COVID-19 Vaccine (8 - 2024-25 season) 07/19/2023   DTaP/Tdap/Td vaccine (3 - Td or Tdap) 01/19/2025*   Flu Shot  06/17/2024   Medicare Annual Wellness Visit  05/17/2025   Pneumococcal Vaccine for age over 58  Completed   Hepatitis B Vaccine  Aged Out   HPV Vaccine  Aged Out   Meningitis B Vaccine  Aged Out  *Topic was postponed. The date shown is not the original due date.    Advanced directives: (Copy Requested) Please bring a copy of your health care power of attorney and living will to the office to be added to your chart at your convenience. You can mail to [facility] [address] [place], [place] [address] or email to [email or web address] Advance Care Planning is important because it:  [x]  Makes sure you receive the medical care that is consistent with your  values, goals, and preferences  [x]  It provides guidance to your family and loved ones and reduces their decisional burden about whether or not they are making the right decisions based on your wishes.

## 2024-05-17 NOTE — Telephone Encounter (Signed)
 Angeline reached out to Clinical research associate following her AWV this morning following suicidal ideation.  Pt does not have a plan.  Talked with Harlene, Dr. Lula CMA.  Regina's message has been send to Dr. Marylynn and they have been working with [facility] for Assisted Living Placement.  Per Harlene COMA has been faxed.  Tried to call Marval Buff @ [phone] and left message so that I can find out when this move will be taking place.  Left voicemail requesting a return phone call.

## 2024-05-17 NOTE — Telephone Encounter (Signed)
 Copied from CRM [id number]. Topic: General - Other >> May 17, 2024 12:33 PM Chasity T wrote: Reason for CRM: Debbie from [facility] is calling to see if a fax was received from 05/10/24 for F12 standing orders. Contacted the clinic and they stated they dont have them. Advised her to refax the papers over and verified the fax number with her.

## 2024-05-17 NOTE — Progress Notes (Signed)
 Subjective:   Jon Sherman. is a 89 y.o. who presents for a Medicare Wellness preventive visit.  As a reminder, Annual Wellness Visits don't include a physical exam, and some assessments may be limited, especially if this visit is performed virtually. We may recommend an in-person follow-up visit with your provider if needed.  Visit Complete: Virtual I connected with  Elgin Era Raddle. on 05/17/24 by a audio enabled telemedicine application and verified that I am speaking with the correct person using two identifiers.  Patient Location: Home  Provider Location: Home Office  I discussed the limitations of evaluation and management by telemedicine. The patient expressed understanding and agreed to proceed.  Vital Signs: Because this visit was a virtual/telehealth visit, some criteria may be missing or patient reported. Any vitals not documented were not able to be obtained and vitals that have been documented are patient reported.  VideoDeclined- This patient declined Librarian, academic. Therefore the visit was completed with audio only.  Persons Participating in Visit: Patient.  AWV Questionnaire: No: Patient Medicare AWV questionnaire was not completed prior to this visit.  Cardiac Risk Factors include: advanced age (>49men, >73 women);male gender     Objective:    Today's Vitals   05/17/24 1016  Weight: 173 lb (78.5 kg)  Height: 5' 8 (1.727 m)   Body mass index is 26.3 kg/m.     05/17/2024   10:41 AM 12/07/2023   12:46 PM 05/15/2023   11:28 AM 04/10/2022   11:55 AM 07/02/2020   12:47 PM 06/29/2019   12:10 PM 06/29/2019    9:54 AM  Advanced Directives  Does Patient Have a Medical Advance Directive? Yes No Yes Yes Yes Yes Yes  Type of Estate agent of [place];Living will  Healthcare Power of [place];Living will Healthcare Power of [place];Living will Healthcare Power of [place];Living will Healthcare Power of  [place];Living will Healthcare Power of Attorney  Does patient want to make changes to medical advance directive?   No - Patient declined No - Patient declined No - Patient declined No - Patient declined  No - Patient declined   Copy of Healthcare Power of Attorney in Chart? No - copy requested  Yes - validated most recent copy scanned in chart (See row information) No - copy requested No - copy requested No - copy requested  No - copy requested      Data saved with a previous flowsheet row definition    Current Medications (verified) Outpatient Encounter Medications as of 05/17/2024  Medication Sig   ciclopirox  (LOPROX ) 0.77 % cream Apply to feet and between toes twice a day.   cyanocobalamin  (VITAMIN B12) 1000 MCG/ML injection Inject 1 mL once a  month   diphenhydramine-acetaminophen  (TYLENOL  PM) 25-500 MG TABS tablet Take 1 tablet by mouth at bedtime as needed.   metroNIDAZOLE  (METROGEL ) 0.75 % gel APPLY A THIN COAT TO THE FACE EVERY DAY   mometasone  (ELOCON ) 0.1 % cream Apply twice daily to affected areas at ankles, legs. Follow with moisturizer. Avoid applying to face, groin, and axilla. Use as directed. Long-term use can cause thinning of the skin.   Multiple Vitamins-Minerals (PRESERVISION AREDS 2) CAPS Take 1 capsule by mouth 2 (two) times daily.   sertraline  (ZOLOFT ) 50 MG tablet TAKE 1 TABLET BY MOUTH EVERY DAY   Vitamin D , Ergocalciferol , (DRISDOL ) 1.25 MG (50000 UNIT) CAPS capsule Take 1 capsule (50,000 Units total) by mouth every 7 (seven) days.   No facility-administered encounter medications  on file as of 05/17/2024.    Allergies (verified) Demerol and Meperidine   History: Past Medical History:  Diagnosis Date   Basal cell carcinoma 06/29/2018   right forehead   Basal cell carcinoma 01/06/2023   L nasal ala, MOHs completed 04/02/23   Basal cell carcinoma 07/29/2023   right preauricular, EDC   Cancer (HCC) 1965   melanoma, LNs resected   COPD (chronic obstructive  pulmonary disease) (HCC)    Infectious colitis 2000   requiring hospitalization   Influenza 02/18/2018   Melanoma (HCC) 1964   right abdomen   Melanoma in situ (HCC) 10/09/2020   right ear helix, MOHs 12/10/20 with Dr. Bluford   Melanoma in situ of right lower extremity (HCC) 07/05/2018   right medial knee. Excised, margins free.   Melanoma of face (HCC) 08/20/2020   right lower cheek 0.76mm, [place] Level II, Mohs with Dr. Bluford 09/28/20   Rosacea    Vertigo    1 episode (2019)   Past Surgical History:  Procedure Laterality Date   basal cell removal on nose  03/2023   on nose   CATARACT EXTRACTION W/PHACO Right 06/29/2019   Procedure: CATARACT EXTRACTION PHACO AND INTRAOCULAR LENS PLACEMENT (IOC) RIGHT;  Surgeon: Mittie Gaskin, MD;  Location: [facility];  Service: Ophthalmology;  Laterality: Right;   CATARACT EXTRACTION W/PHACO Left 08/31/2019   Procedure: CATARACT EXTRACTION PHACO AND INTRAOCULAR LENS PLACEMENT (IOC) LEFT 02:06.7  25.2%  32.01;  Surgeon: Mittie Gaskin, MD;  Location: [facility];  Service: Ophthalmology;  Laterality: Left;   MELANOMA EXCISION WITH SENTINEL LYMPH NODE BIOPSY     SOFT TISSUE TUMOR RESECTION  11/18/1983   Family History  Problem Relation Age of Onset   Mental illness Mother    Heart failure Mother    Heart failure Father    Rheumatic fever Sister    Cancer Neg Hx    Drug abuse Neg Hx    Social History   Socioeconomic History   Marital status: Married    Spouse name: Not on file   Number of children: Not on file   Years of education: Not on file   Highest education level: Not on file  Occupational History   Not on file  Tobacco Use   Smoking status: Former    Current packs/day: 0.00    Average packs/day: 1 pack/day for 10.0 years (10.0 ttl pk-yrs)    Types: Cigarettes    Start date: 02/23/1952    Quit date: 02/22/1962    Years since quitting: 62.2   Smokeless tobacco: Never  Vaping Use   Vaping status: Never  Used  Substance and Sexual Activity   Alcohol use: Yes    Alcohol/week: 7.0 standard drinks of alcohol    Types: 7 Standard drinks or equivalent per week    Comment: Occasional wine and beer with meal    Drug use: No   Sexual activity: Never  Other Topics Concern   Not on file  Social History Narrative   Married    Lives in [place] >10 years as of 02/2018    Social Drivers of Health   Financial Resource Strain: Low Risk  (05/17/2024)   Overall Financial Resource Strain (CARDIA)    Difficulty of Paying Living Expenses: Not hard at all  Food Insecurity: No Food Insecurity (05/17/2024)   Hunger Vital Sign    Worried About Running Out of Food in the Last Year: Never true    Ran Out of Food in  the Last Year: Never true  Transportation Needs: No Transportation Needs (05/17/2024)   PRAPARE - Administrator, Civil Service (Medical): No    Lack of Transportation (Non-Medical): No  Physical Activity: Insufficiently Active (05/17/2024)   Exercise Vital Sign    Days of Exercise per Week: 3 days    Minutes of Exercise per Session: 20 min  Stress: Stress Concern Present (05/17/2024)   [employer] of Occupational Health - Occupational Stress Questionnaire    Feeling of Stress: Very much  Social Connections: Moderately Integrated (05/17/2024)   Social Connection and Isolation Panel    Frequency of Communication with Friends and Family: More than three times a week    Frequency of Social Gatherings with Friends and Family: More than three times a week    Attends Religious Services: More than 4 times per year    Active Member of [employer] or Organizations: No    Attends Engineer, structural: Never    Marital Status: Married    Tobacco Counseling Counseling given: Not Answered    Clinical Intake:  Pre-visit preparation completed: Yes  Pain : No/denies pain     BMI - recorded: 26.3 Nutritional Status: BMI 25 -29 Overweight Nutritional Risks: None Diabetes:  No  Lab Results  Component Value Date   HGBA1C 5.6 10/06/2023     How often do you need to have someone help you when you read instructions, pamphlets, or other written materials from your doctor or pharmacy?: 1 - Never  Interpreter Needed?: No  Information entered by :: R. Moises Terpstra LPN   Activities of Daily Living     05/17/2024   10:17 AM  In your present state of health, do you have any difficulty performing the following activities:  Hearing? 0  Vision? 0  Comment glasses  Difficulty concentrating or making decisions? 1  Walking or climbing stairs? 1  Dressing or bathing? 0  Doing errands, shopping? 0  Preparing Food and eating ? N  Using the Toilet? N  In the past six months, have you accidently leaked urine? Y  Do you have problems with loss of bowel control? Y  Managing your Medications? N  Managing your Finances? N  Housekeeping or managing your Housekeeping? N    Patient Care Team: Marylynn Verneita CROME, MD as PCP - General (Internal Medicine) Marylynn Verneita CROME, MD (Internal Medicine)  I have updated your Care Teams any recent Medical Services you may have received from other providers in the past year.     Assessment:   This is a routine wellness examination for Bryler.  Hearing/Vision screen Hearing Screening - Comments:: No issues Vision Screening - Comments:: glasses   Goals Addressed             This Visit's Progress    Patient Stated       Wants to be able to continue to visit his wife in memory       Depression Screen     05/17/2024   10:24 AM 05/05/2024    2:10 PM 04/19/2024   11:06 AM 04/15/2024    3:16 PM 03/24/2024   10:30 AM 01/20/2024    8:55 AM 12/21/2023   11:25 AM  PHQ 2/9 Scores  PHQ - 2 Score 4 4 3 3 4  0 0  PHQ- 9 Score 9 11 10 10 10       Fall Risk     05/17/2024   10:20 AM 05/05/2024    2:10 PM 04/19/2024  11:06 AM 04/15/2024    3:16 PM 03/24/2024   10:30 AM  Fall Risk   Falls in the past year? 0 1 1 1 1   Number falls in past yr: 0 0 0 0  0  Injury with Fall? 0 0 0 0 0  Risk for fall due to : No Fall Risks History of fall(s) History of fall(s) History of fall(s) History of fall(s)  Follow up Falls evaluation completed;Falls prevention discussed Falls evaluation completed Falls evaluation completed Falls evaluation completed Falls evaluation completed    MEDICARE RISK AT HOME:  Medicare Risk at Home Any stairs in or around the home?: Yes If so, are there any without handrails?: No Home free of loose throw rugs in walkways, pet beds, electrical cords, etc?: Yes Adequate lighting in your home to reduce risk of falls?: Yes Life alert?: No Use of a cane, walker or w/c?: Yes Grab bars in the bathroom?: Yes Shower chair or bench in shower?: Yes Elevated toilet seat or a handicapped toilet?: Yes  TIMED UP AND GO:  Was the test performed?  No  Cognitive Function: 6CIT completed    03/05/2016    2:57 PM  MMSE - Mini Mental State Exam  Orientation to time 5   Orientation to Place 5   Registration 3   Attention/ Calculation 5   Recall 3   Language- name 2 objects 2   Language- repeat 1  Language- follow 3 step command 3   Language- read & follow direction 1   Write a sentence 1   Copy design 1   Total score 30      Data saved with a previous flowsheet row definition        05/17/2024   10:42 AM 05/15/2023   11:37 AM 04/10/2022   11:55 AM 07/02/2020   12:53 PM 06/29/2019    9:52 AM  6CIT Screen  What Year? 0 points 0 points 0 points 0 points 0 points  What month? 0 points 0 points 0 points 0 points 0 points  What time? 0 points 0 points 0 points 0 points 0 points  Count back from 20 0 points 0 points 0 points 0 points 0 points  Months in reverse 0 points 0 points 0 points 0 points 0 points  Repeat phrase 2 points 0 points  0 points 0 points  Total Score 2 points 0 points  0 points 0 points    Immunizations Immunization History  Administered Date(s) Administered   Fluad Quad(high Dose 65+) 08/26/2019,  08/27/2020, 08/21/2021   Influenza Split 09/12/2013   Influenza, High Dose Seasonal PF 09/01/2023   Influenza-Unspecified 09/17/2012, 09/15/2014, 08/19/2022   Moderna Covid-19 Fall Seasonal Vaccine 32yrs & older 09/26/2022   Moderna Covid-19 Vaccine Bivalent Booster 31yrs & up 10/11/2021   Moderna Sars-Covid-2 Vaccination 12/02/2019, 12/30/2019, 10/02/2020, 04/04/2021   PNEUMOCOCCAL CONJUGATE-20 09/19/2022   Pfizer Covid-19 Vaccine Bivalent Booster 63yrs & up 04/15/2022   Pneumococcal Conjugate-13 11/20/2014   Pneumococcal Polysaccharide-23 11/20/2005   Tdap 02/22/2013, 07/26/2013   Zoster Recombinant(Shingrix) 02/11/2018    Screening Tests Health Maintenance  Topic Date Due   Zoster Vaccines- Shingrix (2 of 2) 04/08/2018   COVID-19 Vaccine (8 - 2024-25 season) 07/19/2023   DTaP/Tdap/Td (3 - Td or Tdap) 01/19/2025 (Originally 07/27/2023)   INFLUENZA VACCINE  06/17/2024   Medicare Annual Wellness (AWV)  05/17/2025   Pneumococcal Vaccine: 50+ Years  Completed   Hepatitis B Vaccines  Aged Out   HPV VACCINES  Aged Out  Meningococcal B Vaccine  Aged Out    Health Maintenance  Health Maintenance Due  Topic Date Due   Zoster Vaccines- Shingrix (2 of 2) 04/08/2018   COVID-19 Vaccine (8 - 2024-25 season) 07/19/2023   Health Maintenance Items Addressed: Discussed the need to update tetanus and get his second shingles vaccine. Patient declines covid vaccine  Additional Screening:  Vision Screening: Recommended annual ophthalmology exams for early detection of glaucoma and other disorders of the eye. Up to date [facility] Would you like a referral to an eye doctor? No    Dental Screening: Recommended annual dental exams for proper oral hygiene  Community Resource Referral / Chronic Care Management: CRR required this visit?  No   CCM required this visit?  No   Plan:    I have personally reviewed and noted the following in the patient's chart:   Medical and social  history Use of alcohol, tobacco or illicit drugs  Current medications and supplements including opioid prescriptions. Patient is not currently taking opioid prescriptions. Functional ability and status Nutritional status Physical activity Advanced directives List of other physicians Hospitalizations, surgeries, and ER visits in previous 12 months Vitals Screenings to include cognitive, depression, and falls Referrals and appointments  In addition, I have reviewed and discussed with patient certain preventive protocols, quality metrics, and best practice recommendations. A written personalized care plan for preventive services as well as general preventive health recommendations were provided to patient.   Angeline Fredericks, LPN   12/25/7972   After Visit Summary: (MyChart) Due to this being a telephonic visit, the after visit summary with patients personalized plan was offered to patient via MyChart   Notes: Nothing significant to report at this time.  Phone note sent to PCP

## 2024-05-17 NOTE — Telephone Encounter (Signed)
 Spoke with Marval because there is another message stating that she had not received the FL2 as of this morning. I let her know that I faxed it today, she looked again and did receive. However I had to fax back with orders for OT and PT to evaluate and treat. Marval also stated that pt's move in date is July 14th.

## 2024-05-17 NOTE — Telephone Encounter (Signed)
 Please see previous message

## 2024-05-17 NOTE — Telephone Encounter (Signed)
 Preformed AWV Please refer to depression screening. Patient stated that he feels useless and has thought about committing suicide. Patient stated that he probably would not do it because he knows that it is wrong. Patient stated that he does not know why he is still living because he is of no value to anyone.  Patient stated that he has not thought about how he would commit suicide but it is on his mind almost every day. Patient stated that he is okay now and would not do anything to himself right now. Patient lives at [facility] and his wife is there in the memory care unit.  Patient stated that he does go and see is wife every day. Patient stated that he thinks that he has talked to Dr. Marylynn about this but not sure because his memory is getting bad.

## 2024-05-29 ENCOUNTER — Encounter: Payer: Self-pay | Admitting: Family

## 2024-05-31 ENCOUNTER — Other Ambulatory Visit: Payer: Self-pay

## 2024-05-31 DIAGNOSIS — F331 Major depressive disorder, recurrent, moderate: Secondary | ICD-10-CM | POA: Diagnosis not present

## 2024-05-31 DIAGNOSIS — L719 Rosacea, unspecified: Secondary | ICD-10-CM

## 2024-05-31 MED ORDER — METRONIDAZOLE 0.75 % EX GEL: CUTANEOUS | 0 refills | Status: AC

## 2024-05-31 NOTE — Progress Notes (Signed)
 Patient's daughter left message he has lost his metronidazole  tube and [facility] needs one fresh prescription to get that replaced. aw

## 2024-06-01 DIAGNOSIS — F329 Major depressive disorder, single episode, unspecified: Secondary | ICD-10-CM | POA: Diagnosis not present

## 2024-06-01 DIAGNOSIS — F419 Anxiety disorder, unspecified: Secondary | ICD-10-CM | POA: Diagnosis not present

## 2024-06-01 DIAGNOSIS — F5104 Psychophysiologic insomnia: Secondary | ICD-10-CM | POA: Diagnosis not present

## 2024-06-08 DIAGNOSIS — R7301 Impaired fasting glucose: Secondary | ICD-10-CM | POA: Diagnosis not present

## 2024-06-08 DIAGNOSIS — F320 Major depressive disorder, single episode, mild: Secondary | ICD-10-CM | POA: Diagnosis not present

## 2024-06-08 DIAGNOSIS — E785 Hyperlipidemia, unspecified: Secondary | ICD-10-CM | POA: Diagnosis not present

## 2024-06-08 DIAGNOSIS — R5383 Other fatigue: Secondary | ICD-10-CM | POA: Diagnosis not present

## 2024-06-08 DIAGNOSIS — R278 Other lack of coordination: Secondary | ICD-10-CM | POA: Diagnosis not present

## 2024-06-08 DIAGNOSIS — M6281 Muscle weakness (generalized): Secondary | ICD-10-CM | POA: Diagnosis not present

## 2024-06-08 DIAGNOSIS — R4189 Other symptoms and signs involving cognitive functions and awareness: Secondary | ICD-10-CM | POA: Diagnosis not present

## 2024-06-10 ENCOUNTER — Telehealth: Payer: Self-pay

## 2024-06-10 NOTE — Telephone Encounter (Signed)
 Copied from CRM [id number]. Topic: Clinical - Medication Question >> Jun 10, 2024 12:11 PM Franky GRADE wrote: Reason for CRM: Patient lost the written prescription for his B12 injection during the moving process he would like to know if Dr.Tullo can send a prescription to the pharmacy or if he has to come to the office to pick up a written prescription.  He is due for his injection today.

## 2024-06-10 NOTE — Telephone Encounter (Signed)
 Called pharmacy and he is not due for a refill until 07/02/2024 and he can pay out of pocket if he wants to get earlier. I called pt and advised him of this information and he stated that he will wait until Aug to pick up medication when the insurance will pay for it.

## 2024-06-14 DIAGNOSIS — F331 Major depressive disorder, recurrent, moderate: Secondary | ICD-10-CM | POA: Diagnosis not present

## 2024-06-15 DIAGNOSIS — F320 Major depressive disorder, single episode, mild: Secondary | ICD-10-CM | POA: Diagnosis not present

## 2024-06-15 DIAGNOSIS — E785 Hyperlipidemia, unspecified: Secondary | ICD-10-CM | POA: Diagnosis not present

## 2024-06-15 DIAGNOSIS — R7301 Impaired fasting glucose: Secondary | ICD-10-CM | POA: Diagnosis not present

## 2024-06-15 DIAGNOSIS — R5383 Other fatigue: Secondary | ICD-10-CM | POA: Diagnosis not present

## 2024-06-15 DIAGNOSIS — M6281 Muscle weakness (generalized): Secondary | ICD-10-CM | POA: Diagnosis not present

## 2024-06-15 DIAGNOSIS — R4189 Other symptoms and signs involving cognitive functions and awareness: Secondary | ICD-10-CM | POA: Diagnosis not present

## 2024-06-15 DIAGNOSIS — R278 Other lack of coordination: Secondary | ICD-10-CM | POA: Diagnosis not present

## 2024-06-20 ENCOUNTER — Encounter: Payer: Self-pay | Admitting: Student

## 2024-06-20 ENCOUNTER — Non-Acute Institutional Stay: Payer: Self-pay | Admitting: Student

## 2024-06-20 DIAGNOSIS — Z9181 History of falling: Secondary | ICD-10-CM | POA: Diagnosis not present

## 2024-06-20 DIAGNOSIS — E559 Vitamin D deficiency, unspecified: Secondary | ICD-10-CM | POA: Diagnosis not present

## 2024-06-20 DIAGNOSIS — F320 Major depressive disorder, single episode, mild: Secondary | ICD-10-CM

## 2024-06-20 DIAGNOSIS — E785 Hyperlipidemia, unspecified: Secondary | ICD-10-CM | POA: Diagnosis not present

## 2024-06-20 DIAGNOSIS — F09 Unspecified mental disorder due to known physiological condition: Secondary | ICD-10-CM

## 2024-06-20 DIAGNOSIS — E538 Deficiency of other specified B group vitamins: Secondary | ICD-10-CM

## 2024-06-20 NOTE — Progress Notes (Unsigned)
 Location:  Other [facility]) Nursing Home Room Number: 92 S Place of Service:  ALF [phone]) Provider:  Richerd MYRTIS Brigham, M.D.  Patient Care Team: Marylynn Verneita CROME, MD as PCP - General (Internal Medicine) Marylynn Verneita CROME, MD (Internal Medicine)  Extended Emergency Contact Information Primary Emergency Contact: [facility] Address: [address] [place], [place] [address] [place] Home Phone: [phone] Mobile Phone: [phone] Relation: Spouse Secondary Emergency Contact: [facility] Address: [address]          [place], [place] [address] [place] Home Phone: [phone] Mobile Phone: [phone] Relation: Daughter  Code Status:  DNR Goals of care: Advanced Directive information    05/17/2024   10:41 AM  Advanced Directives  Does Patient Have a Medical Advance Directive? Yes  Type of Estate agent of [place];Living will  Copy of Healthcare Power of Attorney in Chart? No - copy requested     Chief Complaint  Patient presents with   Medical Management of Chronic Issues    Routine visit. Discuss need for shingrix and covid.     HPI:  Pt is a 89 y.o. male seen today for medical management of chronic diseases.   History of Present Illness The patient is a 89 year old with depression and high cholesterol who presents for a geriatric consultation.  He has a history of depression, which has worsened since his wife was admitted to [facility] over a year ago due to early-stage dementia. He describes this event as 'very depressing' and has been on sertraline  (Zoloft ) for mood management. He is unsure of its effectiveness but continues to take it. He has concerns about his memory, stating he 'can't manage anything anymore,' including keeping a checkbook. He still manages his medications and drives locally, although he questions how much longer he will be able to do so safely.  He has a history  of high cholesterol and is on medication for it, although he was surprised to learn about this condition during the visit.  He mentions experiencing falls, with one incident occurring at [facility] and another at [facility] within the past year. He uses a cane primarily for assistance with curbs but does not use a walker or rollator. He participated in physical therapy in January to help with balance.  He discusses his nutritional intake, stating he eats three meals a day at his current residence and typically consumes an egg or two each morning. He acknowledges a previous period of weight loss due to reduced food intake but believes his weight has stabilized around 173 pounds.  He has been living at [facility] for over 20 years, originally from [place], where he lived for about 50 years. He has three children who all reside in [place] and none are married. He has not made many new friends at his current residence and mentions feeling depressed about his and his wife's situations.  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 Originally from [place], where he lived for about 50 years. He has three children who live in [place]. His wife is in a memory care facility due to early-stage dementia. The patient experiences depression, particularly since his wife's move to the care facility. He has difficulty managing  finances and memory issues. He uses a cane for mobility and has had a couple of falls in the past year. He is still able to drive locally but is considering an evaluation for driving safety. He enjoys reading and has a close relationship with his children, who visit him. He is adjusting to the recent move to the assisted living facility.  Family History - Wife: dementia - No  family history of extreme longevity; parents died in their 56, wife's parents in their eighties.  DIAGNOSTIC MoCA: 25 in 2025  Past Medical History:  Diagnosis Date   Basal cell carcinoma 06/29/2018   right forehead   Basal cell carcinoma 01/06/2023   L nasal ala, MOHs completed 04/02/23   Basal cell carcinoma 07/29/2023   right preauricular, EDC   Cancer (HCC) 1965   melanoma, LNs resected   COPD (chronic obstructive pulmonary disease) (HCC)    Infectious colitis 2000   requiring hospitalization   Influenza 02/18/2018   Melanoma (HCC) 1964   right abdomen   Melanoma in situ (HCC) 10/09/2020   right ear helix, MOHs 12/10/20 with Dr. Bluford   Melanoma in situ of right lower extremity (HCC) 07/05/2018   right medial knee. Excised, margins free.   Melanoma of face (HCC) 08/20/2020   right lower cheek 0.32mm, Clarks Level II, Mohs with Dr. Bluford 09/28/20   Rosacea    Vertigo    1 episode (2019)   Past Surgical History:  Procedure Laterality Date   basal cell removal on nose  03/2023   on nose   CATARACT EXTRACTION W/PHACO Right 06/29/2019   Procedure: CATARACT EXTRACTION PHACO AND INTRAOCULAR LENS PLACEMENT (IOC) RIGHT;  Surgeon: Mittie Gaskin, MD;  Location: [facility];  Service: Ophthalmology;  Laterality: Right;   CATARACT EXTRACTION W/PHACO Left 08/31/2019   Procedure: CATARACT EXTRACTION PHACO AND INTRAOCULAR LENS PLACEMENT (IOC) LEFT 02:06.7  25.2%  32.01;  Surgeon: Mittie Gaskin, MD;  Location: [facility];  Service: Ophthalmology;  Laterality: Left;   MELANOMA EXCISION WITH SENTINEL LYMPH NODE BIOPSY     SOFT TISSUE TUMOR RESECTION  11/18/1983    Allergies  Allergen Reactions   Demerol     Caused patient to feel nauseated   Meperidine     Other reaction(s): Unknown    Outpatient Encounter Medications as of 06/20/2024  Medication Sig   acetaminophen  (TYLENOL ) 325 MG tablet Take 650 mg by mouth every 4 (four) hours as needed.    alum & mag hydroxide-simeth (MAALOX/MYLANTA) 200-200-20 MG/5 SUSP Give 2 Tbsp by mouth every 4 hours as needed for gas, indigestion, or upset stomach supervised self-administration Notify MD if no relief   bismuth subsalicylate (PEPTO BISMOL) 262 MG/15ML suspension 10 cc by mouth as needed for loose stool supervised self administration   cetirizine (ZYRTEC) 5 MG tablet Take 5 mg by mouth as needed for allergies.   cyanocobalamin  (VITAMIN B12) 1000 MCG/ML injection Inject 1 mL once a  month   dextromethorphan-guaiFENesin (ROBITUSSIN-DM) 10-100 MG/5ML liquid Give 2 tsp by mouth every 4 hours as needed for coughing. supervised self-administration Notify MD if no relief in 3 days or continued use   diphenhydramine-acetaminophen  (TYLENOL  PM) 25-500 MG TABS tablet Take 1 tablet by mouth at bedtime as needed.   Glucose 15 GM/32ML GEL Take 1 packet by mouth as needed.   magnesium hydroxide (MILK OF MAGNESIA) 400 MG/5ML suspension Give 2 Tbsp by mouth as needed for constipation. supervised self-administration daily  Call MD if no relief in 3 days  of continued use   metroNIDAZOLE  (METROGEL ) 0.75 % gel APPLY A THIN COAT TO THE FACE EVERY DAY   Multiple Vitamins-Minerals (PRESERVISION AREDS 2) CAPS Take 1 capsule by mouth 2 (two) times daily.   nystatin (MYCOSTATIN/NYSTOP) powder Apply to excoriated area topically as needed for skin breakdown supervised selfadministration twice daily   ondansetron (ZOFRAN) 4 MG tablet Take 4 mg by mouth daily as needed for nausea or vomiting.   OXYGEN Inhale 2 L into the lungs. May start for dyspnea or SOB   sertraline  (ZOLOFT ) 50 MG tablet TAKE 1 TABLET BY MOUTH EVERY DAY   ciclopirox  (LOPROX ) 0.77 % cream Apply to feet and between toes twice a day. (Patient not taking: Reported on 06/20/2024)   mometasone  (ELOCON ) 0.1 % cream Apply twice daily to affected areas at ankles, legs. Follow with moisturizer. Avoid applying to face, groin, and axilla. Use as directed. Long-term  use can cause thinning of the skin. (Patient not taking: Reported on 06/20/2024)   Vitamin D , Ergocalciferol , (DRISDOL ) 1.25 MG (50000 UNIT) CAPS capsule Take 1 capsule (50,000 Units total) by mouth every 7 (seven) days. (Patient not taking: Reported on 06/20/2024)   No facility-administered encounter medications on file as of 06/20/2024.    Review of Systems  Immunization History  Administered Date(s) Administered   Fluad Quad(high Dose 65+) 08/26/2019, 08/27/2020, 08/21/2021   Influenza Split 09/12/2013   Influenza, High Dose Seasonal PF 09/01/2023   Influenza-Unspecified 09/17/2012, 09/15/2014, 08/19/2022   Moderna Covid-19 Fall Seasonal Vaccine 24yrs & older 09/26/2022   Moderna Covid-19 Vaccine Bivalent Booster 37yrs & up 10/11/2021   Moderna Sars-Covid-2 Vaccination 12/02/2019, 12/30/2019, 10/02/2020, 04/04/2021   PNEUMOCOCCAL CONJUGATE-20 09/19/2022   Pfizer Covid-19 Vaccine Bivalent Booster 5yrs & up 04/15/2022   Pneumococcal Conjugate-13 11/20/2014   Pneumococcal Polysaccharide-23 11/20/2005   Tdap 02/22/2013, 07/26/2013   Zoster Recombinant(Shingrix) 02/11/2018   Pertinent  Health Maintenance Due  Topic Date Due   INFLUENZA VACCINE  06/17/2024      03/24/2024   10:30 AM 04/15/2024    3:16 PM 04/19/2024   11:06 AM 05/05/2024    2:10 PM 05/17/2024   10:20 AM  Fall Risk  Falls in the past year? 1 1 1 1  0  Was there an injury with Fall? 0 0 0 0 0  Fall Risk Category Calculator 1 1 1 1  0  Patient at Risk for Falls Due to History of fall(s) History of fall(s) History of fall(s) History of fall(s) No Fall Risks  Fall risk Follow up Falls evaluation completed Falls evaluation completed Falls evaluation completed Falls evaluation completed Falls evaluation completed;Falls prevention discussed   Functional Status Survey:    Vitals:   06/20/24 1617  BP: (!) 151/68  Pulse: 68  SpO2: 96%  Weight: 173 lb (78.5 kg)  Height: 5' 8 (1.727 m)   Body mass index is 26.3 kg/m. Physical  Exam  Labs reviewed: Recent Labs    10/06/23 1514 03/24/24 1110  NA 140 138  K 4.6 4.4  CL 104 102  CO2 30 30  GLUCOSE 101* 92  BUN 28* 20  CREATININE 1.21 1.15  CALCIUM 9.2 8.7   Recent Labs    10/06/23 1514 03/24/24 1110  AST 12 10  ALT 11 10  ALKPHOS 67 61  BILITOT 0.4 0.6  PROT 6.1 5.9*  ALBUMIN 4.3 4.1   Recent Labs    10/06/23 1514  WBC 5.7  NEUTROABS 3.8  HGB 14.4  HCT 43.0  MCV 96.4  PLT 233.0  Lab Results  Component Value Date   TSH 1.79 10/06/2023   Lab Results  Component Value Date   HGBA1C 5.6 10/06/2023   Lab Results  Component Value Date   CHOL 162 10/06/2023   HDL 53.40 10/06/2023   LDLCALC 85 10/06/2023   LDLDIRECT 88.0 10/06/2023   TRIG 119.0 10/06/2023   CHOLHDL 3 10/06/2023    Significant Diagnostic Results in last 30 days:  No results found.  Assessment/Plan Chronic depression Exacerbated by wife's admission to [facility] and recent move to assisted living. Reports persistent feelings of depression. Currently on sertraline , which he finds somewhat helpful. Patient with low mood in the setting of recent admission. Transitioned here ~3 weeks ago. Discussed transition period. Continue Sertraline  50 mg daily, consider increasing dose if no improvement.  - Continue sertraline  as prescribed - Encourage participation in activities to improve mood and social   Cognitive impairment Reports memory issues and difficulty managing finances, including inability to keep a checkbook. History of B12 deficiency, on supplements. DNR order in place. - Continue sertraline  as prescribed - Encourage participation in activities to improve cognitive function and social engagement - Ensure vitamin levels are within optimal range - Consider referral to occupational therapy for evaluation of cognitive function and driving safety  History of falls Reports two falls in the last year, one at [employer] and one at [facility]. Uses a cane for  stability, especially over curbs, but does not use a walker. No recent falls since moving to [facility]. - Recommend physical therapy to improve balance and prevent future falls - Encourage participation in exercise classes focused on balance and strength  Vitamin B12 deficiency Will check vitamin levels during upcoming blood work. - Continue vitamin B12 supplementation - Ensure adequate dietary protein intake, aiming for 30 grams per meal - Monitor weight and nutritional status  Hyperlipidemia - Monitor lipid levels with upcoming blood work    Scientist, forensic Communication: nursing  Labs/tests ordered:  CBC, CMP, TSH, Lipid Panel, A1c.

## 2024-06-21 ENCOUNTER — Encounter: Payer: Self-pay | Admitting: Student

## 2024-06-23 DIAGNOSIS — E559 Vitamin D deficiency, unspecified: Secondary | ICD-10-CM | POA: Diagnosis not present

## 2024-06-23 DIAGNOSIS — R7301 Impaired fasting glucose: Secondary | ICD-10-CM | POA: Diagnosis not present

## 2024-06-23 DIAGNOSIS — E785 Hyperlipidemia, unspecified: Secondary | ICD-10-CM | POA: Diagnosis not present

## 2024-06-23 DIAGNOSIS — D519 Vitamin B12 deficiency anemia, unspecified: Secondary | ICD-10-CM | POA: Diagnosis not present

## 2024-06-23 DIAGNOSIS — R5383 Other fatigue: Secondary | ICD-10-CM | POA: Diagnosis not present

## 2024-06-23 LAB — HEPATIC FUNCTION PANEL
ALT: 9 U/L — AB (ref 10–40)
AST: 9 — AB (ref 14–40)
Alkaline Phosphatase: 57 (ref 25–125)
Bilirubin, Total: 0.4

## 2024-06-23 LAB — VITAMIN D 25 HYDROXY (VIT D DEFICIENCY, FRACTURES): Vit D, 25-Hydroxy: 24

## 2024-06-23 LAB — BASIC METABOLIC PANEL WITH GFR
BUN: 24 — AB (ref 4–21)
CO2: 26 — AB (ref 13–22)
Chloride: 108 (ref 99–108)
Creatinine: 1.2 (ref 0.6–1.3)
Glucose: 90
Potassium: 3.9 meq/L (ref 3.5–5.1)
Sodium: 140 (ref 137–147)

## 2024-06-23 LAB — LIPID PANEL
Cholesterol: 156 (ref 0–200)
HDL: 53 (ref 35–70)
LDL Cholesterol: 87
Triglycerides: 74 (ref 40–160)

## 2024-06-23 LAB — COMPREHENSIVE METABOLIC PANEL WITH GFR
Albumin: 3.3 — AB (ref 3.5–5.0)
Calcium: 8.6 — AB (ref 8.7–10.7)
Globulin: 1.5
eGFR: 58

## 2024-06-23 LAB — CBC AND DIFFERENTIAL
HCT: 36 — AB (ref 41–53)
Hemoglobin: 11.7 — AB (ref 13.5–17.5)
Neutrophils Absolute: 3291
Platelets: 185 K/uL (ref 150–400)
WBC: 5.3

## 2024-06-23 LAB — CBC: RBC: 3.69 — AB (ref 3.87–5.11)

## 2024-06-24 DIAGNOSIS — R7301 Impaired fasting glucose: Secondary | ICD-10-CM | POA: Diagnosis not present

## 2024-06-24 DIAGNOSIS — F320 Major depressive disorder, single episode, mild: Secondary | ICD-10-CM | POA: Diagnosis not present

## 2024-06-24 DIAGNOSIS — R278 Other lack of coordination: Secondary | ICD-10-CM | POA: Diagnosis not present

## 2024-06-24 DIAGNOSIS — R4189 Other symptoms and signs involving cognitive functions and awareness: Secondary | ICD-10-CM | POA: Diagnosis not present

## 2024-06-24 DIAGNOSIS — E785 Hyperlipidemia, unspecified: Secondary | ICD-10-CM | POA: Diagnosis not present

## 2024-06-24 DIAGNOSIS — R5383 Other fatigue: Secondary | ICD-10-CM | POA: Diagnosis not present

## 2024-06-24 DIAGNOSIS — M6281 Muscle weakness (generalized): Secondary | ICD-10-CM | POA: Diagnosis not present

## 2024-07-01 DIAGNOSIS — F320 Major depressive disorder, single episode, mild: Secondary | ICD-10-CM | POA: Diagnosis not present

## 2024-07-01 DIAGNOSIS — E785 Hyperlipidemia, unspecified: Secondary | ICD-10-CM | POA: Diagnosis not present

## 2024-07-01 DIAGNOSIS — R5383 Other fatigue: Secondary | ICD-10-CM | POA: Diagnosis not present

## 2024-07-01 DIAGNOSIS — M6281 Muscle weakness (generalized): Secondary | ICD-10-CM | POA: Diagnosis not present

## 2024-07-01 DIAGNOSIS — R4189 Other symptoms and signs involving cognitive functions and awareness: Secondary | ICD-10-CM | POA: Diagnosis not present

## 2024-07-01 DIAGNOSIS — R278 Other lack of coordination: Secondary | ICD-10-CM | POA: Diagnosis not present

## 2024-07-01 DIAGNOSIS — R7301 Impaired fasting glucose: Secondary | ICD-10-CM | POA: Diagnosis not present

## 2024-07-04 ENCOUNTER — Ambulatory Visit: Admitting: Internal Medicine

## 2024-07-05 DIAGNOSIS — E785 Hyperlipidemia, unspecified: Secondary | ICD-10-CM | POA: Diagnosis not present

## 2024-07-05 DIAGNOSIS — R4189 Other symptoms and signs involving cognitive functions and awareness: Secondary | ICD-10-CM | POA: Diagnosis not present

## 2024-07-05 DIAGNOSIS — M6281 Muscle weakness (generalized): Secondary | ICD-10-CM | POA: Diagnosis not present

## 2024-07-05 DIAGNOSIS — F320 Major depressive disorder, single episode, mild: Secondary | ICD-10-CM | POA: Diagnosis not present

## 2024-07-05 DIAGNOSIS — R278 Other lack of coordination: Secondary | ICD-10-CM | POA: Diagnosis not present

## 2024-07-05 DIAGNOSIS — R7301 Impaired fasting glucose: Secondary | ICD-10-CM | POA: Diagnosis not present

## 2024-07-05 DIAGNOSIS — R5383 Other fatigue: Secondary | ICD-10-CM | POA: Diagnosis not present

## 2024-07-12 DIAGNOSIS — R4189 Other symptoms and signs involving cognitive functions and awareness: Secondary | ICD-10-CM | POA: Diagnosis not present

## 2024-07-12 DIAGNOSIS — R278 Other lack of coordination: Secondary | ICD-10-CM | POA: Diagnosis not present

## 2024-07-12 DIAGNOSIS — R5383 Other fatigue: Secondary | ICD-10-CM | POA: Diagnosis not present

## 2024-07-12 DIAGNOSIS — E785 Hyperlipidemia, unspecified: Secondary | ICD-10-CM | POA: Diagnosis not present

## 2024-07-12 DIAGNOSIS — R7301 Impaired fasting glucose: Secondary | ICD-10-CM | POA: Diagnosis not present

## 2024-07-12 DIAGNOSIS — F320 Major depressive disorder, single episode, mild: Secondary | ICD-10-CM | POA: Diagnosis not present

## 2024-07-12 DIAGNOSIS — M6281 Muscle weakness (generalized): Secondary | ICD-10-CM | POA: Diagnosis not present

## 2024-07-15 DIAGNOSIS — R4189 Other symptoms and signs involving cognitive functions and awareness: Secondary | ICD-10-CM | POA: Diagnosis not present

## 2024-07-15 DIAGNOSIS — E785 Hyperlipidemia, unspecified: Secondary | ICD-10-CM | POA: Diagnosis not present

## 2024-07-15 DIAGNOSIS — F320 Major depressive disorder, single episode, mild: Secondary | ICD-10-CM | POA: Diagnosis not present

## 2024-07-15 DIAGNOSIS — R278 Other lack of coordination: Secondary | ICD-10-CM | POA: Diagnosis not present

## 2024-07-15 DIAGNOSIS — M6281 Muscle weakness (generalized): Secondary | ICD-10-CM | POA: Diagnosis not present

## 2024-07-15 DIAGNOSIS — R5383 Other fatigue: Secondary | ICD-10-CM | POA: Diagnosis not present

## 2024-07-15 DIAGNOSIS — R7301 Impaired fasting glucose: Secondary | ICD-10-CM | POA: Diagnosis not present

## 2024-07-20 DIAGNOSIS — R5383 Other fatigue: Secondary | ICD-10-CM | POA: Diagnosis not present

## 2024-07-20 DIAGNOSIS — E785 Hyperlipidemia, unspecified: Secondary | ICD-10-CM | POA: Diagnosis not present

## 2024-07-20 DIAGNOSIS — R278 Other lack of coordination: Secondary | ICD-10-CM | POA: Diagnosis not present

## 2024-07-20 DIAGNOSIS — M6281 Muscle weakness (generalized): Secondary | ICD-10-CM | POA: Diagnosis not present

## 2024-07-20 DIAGNOSIS — R4189 Other symptoms and signs involving cognitive functions and awareness: Secondary | ICD-10-CM | POA: Diagnosis not present

## 2024-07-20 DIAGNOSIS — R7301 Impaired fasting glucose: Secondary | ICD-10-CM | POA: Diagnosis not present

## 2024-07-20 DIAGNOSIS — F320 Major depressive disorder, single episode, mild: Secondary | ICD-10-CM | POA: Diagnosis not present

## 2024-07-22 DIAGNOSIS — H353221 Exudative age-related macular degeneration, left eye, with active choroidal neovascularization: Secondary | ICD-10-CM | POA: Diagnosis not present

## 2024-07-22 DIAGNOSIS — H353231 Exudative age-related macular degeneration, bilateral, with active choroidal neovascularization: Secondary | ICD-10-CM | POA: Diagnosis not present

## 2024-07-22 DIAGNOSIS — Z961 Presence of intraocular lens: Secondary | ICD-10-CM | POA: Diagnosis not present

## 2024-07-25 DIAGNOSIS — F320 Major depressive disorder, single episode, mild: Secondary | ICD-10-CM | POA: Diagnosis not present

## 2024-07-25 DIAGNOSIS — R7301 Impaired fasting glucose: Secondary | ICD-10-CM | POA: Diagnosis not present

## 2024-07-25 DIAGNOSIS — R5383 Other fatigue: Secondary | ICD-10-CM | POA: Diagnosis not present

## 2024-07-25 DIAGNOSIS — M6281 Muscle weakness (generalized): Secondary | ICD-10-CM | POA: Diagnosis not present

## 2024-07-25 DIAGNOSIS — R4189 Other symptoms and signs involving cognitive functions and awareness: Secondary | ICD-10-CM | POA: Diagnosis not present

## 2024-07-25 DIAGNOSIS — E785 Hyperlipidemia, unspecified: Secondary | ICD-10-CM | POA: Diagnosis not present

## 2024-07-26 DIAGNOSIS — F331 Major depressive disorder, recurrent, moderate: Secondary | ICD-10-CM | POA: Diagnosis not present

## 2024-08-02 ENCOUNTER — Ambulatory Visit: Admitting: Dermatology

## 2024-08-02 DIAGNOSIS — D1801 Hemangioma of skin and subcutaneous tissue: Secondary | ICD-10-CM

## 2024-08-02 DIAGNOSIS — L820 Inflamed seborrheic keratosis: Secondary | ICD-10-CM

## 2024-08-02 DIAGNOSIS — Z872 Personal history of diseases of the skin and subcutaneous tissue: Secondary | ICD-10-CM

## 2024-08-02 DIAGNOSIS — C44219 Basal cell carcinoma of skin of left ear and external auricular canal: Secondary | ICD-10-CM

## 2024-08-02 DIAGNOSIS — Z1283 Encounter for screening for malignant neoplasm of skin: Secondary | ICD-10-CM | POA: Diagnosis not present

## 2024-08-02 DIAGNOSIS — D225 Melanocytic nevi of trunk: Secondary | ICD-10-CM

## 2024-08-02 DIAGNOSIS — Z85828 Personal history of other malignant neoplasm of skin: Secondary | ICD-10-CM

## 2024-08-02 DIAGNOSIS — L821 Other seborrheic keratosis: Secondary | ICD-10-CM | POA: Diagnosis not present

## 2024-08-02 DIAGNOSIS — L814 Other melanin hyperpigmentation: Secondary | ICD-10-CM

## 2024-08-02 DIAGNOSIS — D2362 Other benign neoplasm of skin of left upper limb, including shoulder: Secondary | ICD-10-CM

## 2024-08-02 DIAGNOSIS — L578 Other skin changes due to chronic exposure to nonionizing radiation: Secondary | ICD-10-CM | POA: Diagnosis not present

## 2024-08-02 DIAGNOSIS — D239 Other benign neoplasm of skin, unspecified: Secondary | ICD-10-CM

## 2024-08-02 DIAGNOSIS — Z86006 Personal history of melanoma in-situ: Secondary | ICD-10-CM

## 2024-08-02 DIAGNOSIS — D229 Melanocytic nevi, unspecified: Secondary | ICD-10-CM

## 2024-08-02 DIAGNOSIS — D2261 Melanocytic nevi of right upper limb, including shoulder: Secondary | ICD-10-CM

## 2024-08-02 DIAGNOSIS — W908XXA Exposure to other nonionizing radiation, initial encounter: Secondary | ICD-10-CM

## 2024-08-02 DIAGNOSIS — D485 Neoplasm of uncertain behavior of skin: Secondary | ICD-10-CM | POA: Diagnosis not present

## 2024-08-02 DIAGNOSIS — Z85820 Personal history of malignant melanoma of skin: Secondary | ICD-10-CM

## 2024-08-02 NOTE — Progress Notes (Signed)
 Follow-Up Visit   Subjective  Jon Sherman. is a 89 y.o. male who presents for the following: Skin Cancer Screening and Full Body Skin Exam  The patient presents for Total-Body Skin Exam (TBSE) for skin cancer screening and mole check. The patient has spots, moles and lesions to be evaluated, some may be new or changing. He has a growth on the abdomen that he picks at. Recheck AK vs BCC of the vertex scalp.   The following portions of the chart were reviewed this encounter and updated as appropriate: medications, allergies, medical history  Review of Systems:  No other skin or systemic complaints except as noted in HPI or Assessment and Plan.  Objective  Well appearing patient in no apparent distress; mood and affect are within normal limits.  A full examination was performed including scalp, head, eyes, ears, nose, lips, neck, chest, axillae, abdomen, back, buttocks, bilateral upper extremities, bilateral lower extremities, hands, feet, fingers, toes, fingernails, and toenails. All findings within normal limits unless otherwise noted below.   Relevant physical exam findings are noted in the Assessment and Plan.  abdomen (2) Erythematous stuck-on, waxy papule left tragus 6 mm pink ulcerated macule   Assessment & Plan   SKIN CANCER SCREENING PERFORMED TODAY.  ACTINIC DAMAGE - Chronic condition, secondary to cumulative UV/sun exposure - diffuse scaly erythematous macules with underlying dyspigmentation - Recommend daily broad spectrum sunscreen SPF 30+ to sun-exposed areas, reapply every 2 hours as needed.  - Staying in the shade or wearing long sleeves, sun glasses (UVA+UVB protection) and wide brim hats (4-inch brim around the entire circumference of the hat) are also recommended for sun protection.  - Call for new or changing lesions.  LENTIGINES, SEBORRHEIC KERATOSES, HEMANGIOMAS - Benign normal skin lesions - Benign-appearing - Call for any changes  MELANOCYTIC  NEVI - Tan-brown and/or pink-flesh-colored symmetric macules and papules - right upper arm 3 mm med dark brown macule  - Right Spinal Upper Back 0.7 cm two toned brown macule darker medial - Right Spinal Mid Upper Back 0.4 cm brown macule - Benign appearing on exam today - Observation - Call clinic for new or changing moles - Recommend daily use of broad spectrum spf 30+ sunscreen to sun-exposed areas.   DERMATOFIBROMA Exam: Firm pink slightly scaly nodule 4 mm left upper elbow  Treatment Plan: A dermatofibroma is a benign growth possibly related to trauma, such as an insect bite, cut from shaving, or inflamed acne-type bump.  Treatment options to remove include shave or excision with resulting scar and risk of recurrence.  Since benign-appearing and not bothersome, will observe for now.   HISTORY OF MELANOMA. Right abdomen (1964), right lower cheek (0.3 mm Level II, 11/21)  - No evidence of recurrence today - Recommend regular full body skin exams - Recommend daily broad spectrum sunscreen SPF 30+ to sun-exposed areas, reapply every 2 hours as needed.  - Call if any new or changing lesions are noted between office visits   HISTORY OF MELANOMA IN SITU.  Right ear helix, Mohs 12/10/20 with Dr. Bluford, right medial knee 08/19  - No evidence of recurrence today - Recommend regular full body skin exams - Recommend daily broad spectrum sunscreen SPF 30+ to sun-exposed areas, reapply every 2 hours as needed.  - Call if any new or changing lesions are noted between office visits    HISTORY OF BASAL CELL CARCINOMA OF THE SKIN - No evidence of recurrence today - Recommend regular full body skin exams -  Recommend daily broad spectrum sunscreen SPF 30+ to sun-exposed areas, reapply every 2 hours as needed.  - Call if any new or changing lesions are noted between office visits  HISTORY OF PRECANCEROUS ACTINIC KERATOSIS - site(s) of PreCancerous Actinic Keratosis clear today at vertex scalp -  these may recur and new lesions may form requiring treatment to prevent transformation into skin cancer - observe for new or changing spots and contact [facility] for appointment if occur - photoprotection with sun protective clothing; sunglasses and broad spectrum sunscreen with SPF of at least 30 + and frequent self skin exams recommended - yearly exams by a dermatologist recommended for persons with history of PreCancerous Actinic Keratoses  INFLAMED SEBORRHEIC KERATOSIS (2) abdomen (2) Symptomatic, irritating, patient would like treated. Destruction of lesion - abdomen (2)  Destruction method: cryotherapy   Informed consent: discussed and consent obtained   Lesion destroyed using liquid nitrogen: Yes   Region frozen until ice ball extended beyond lesion: Yes   Outcome: patient tolerated procedure well with no complications   Post-procedure details: wound care instructions given   Additional details:  Prior to procedure, discussed risks of blister formation, small wound, skin dyspigmentation, or rare scar following cryotherapy. Recommend Vaseline ointment to treated areas while healing.   NEOPLASM OF UNCERTAIN BEHAVIOR OF SKIN left tragus Epidermal / dermal shaving  Lesion diameter (cm):  0.6 Informed consent: discussed and consent obtained   Patient was prepped and draped in usual sterile fashion: Area prepped with alcohol. Anesthesia: the lesion was anesthetized in a standard fashion   Anesthetic:  1% lidocaine  w/ epinephrine  1-100,000 buffered w/ 8.4% NaHCO3 Instrument used: flexible razor blade   Hemostasis achieved with: pressure, aluminum chloride and electrodesiccation   Outcome: patient tolerated procedure well    Destruction of lesion  Destruction method: electrodesiccation and curettage   Informed consent: discussed and consent obtained   Anesthesia: the lesion was anesthetized in a standard fashion   Anesthetic:  1% lidocaine  w/ epinephrine  1-100,000 local  infiltration Curettage performed in three different directions: Yes   Electrodesiccation performed over the curetted area: Yes   Final wound size (cm):  0.8 Hemostasis achieved with:  pressure, aluminum chloride and electrodesiccation Outcome: patient tolerated procedure well with no complications   Post-procedure details: wound care instructions given   Post-procedure details comment:  Ointment and bandage applied.  Specimen 1 - Surgical pathology Differential Diagnosis: AK r/o BCC Check Margins: No EDC today Return in about 6 months (around 01/30/2025).  IAndrea Kerns, CMA, am acting as scribe for Rexene Rattler, MD .   Documentation: I have reviewed the above documentation for accuracy and completeness, and I agree with the above.  Rexene Rattler, MD

## 2024-08-02 NOTE — Patient Instructions (Addendum)
 Continue mometasone  cream once to twice daily as needed for rash on legs and ankles. Avoid applying to face, groin, and axilla. Use as directed. Long-term use can cause thinning of the skin.  Continue ketoconazole 2% cream to feet and between toes to prevent recurrence of fungus.  Cryotherapy Aftercare  Wash gently with soap and water everyday.   Apply Vaseline and Band-Aid daily until healed.    Wound Care Instructions  Cleanse wound gently with soap and water once a day then pat dry with clean gauze. Apply a thin coat of Petrolatum (petroleum jelly, Vaseline) over the wound (unless you have an allergy to this). We recommend that you use a new, sterile tube of Vaseline. Do not pick or remove scabs. Do not remove the yellow or white healing tissue from the base of the wound.  Cover the wound with fresh, clean, nonstick gauze and secure with paper tape. You may use Band-Aids in place of gauze and tape if the wound is small enough, but would recommend trimming much of the tape off as there is often too much. Sometimes Band-Aids can irritate the skin.  You should call the office for your biopsy report after 1 week if you have not already been contacted.  If you experience any problems, such as abnormal amounts of bleeding, swelling, significant bruising, significant pain, or evidence of infection, please call the office immediately.  FOR ADULT SURGERY PATIENTS: If you need something for pain relief you may take 1 extra strength Tylenol  (acetaminophen ) AND 2 Ibuprofen (200mg  each) together every 4 hours as needed for pain. (do not take these if you are allergic to them or if you have a reason you should not take them.) Typically, you may only need pain medication for 1 to 3 days.    Due to recent changes in healthcare laws, you may see results of your pathology and/or laboratory studies on MyChart before the doctors have had a chance to review them. We understand that in some cases there may  be results that are confusing or concerning to you. Please understand that not all results are received at the same time and often the doctors may need to interpret multiple results in order to provide you with the best plan of care or course of treatment. Therefore, we ask that you please give us  2 business days to thoroughly review all your results before contacting the office for clarification. Should we see a critical lab result, you will be contacted sooner.   If You Need Anything After Your Visit  If you have any questions or concerns for your doctor, please call our main line at [phone] and press option 4 to reach your doctor's medical assistant. If no one answers, please leave a voicemail as directed and we will return your call as soon as possible. Messages left after 4 pm will be answered the following business day.   You may also send us  a message via MyChart. We typically respond to MyChart messages within 1-2 business days.  For prescription refills, please ask your pharmacy to contact our office. Our fax number is [phone].  If you have an urgent issue when the clinic is closed that cannot wait until the next business day, you can page your doctor at the number below.    Please note that while we do our best to be available for urgent issues outside of office hours, we are not available 24/7.   If you have an urgent issue and are unable  to reach us , you may choose to seek medical care at your doctor's office, retail clinic, urgent care center, or emergency room.  If you have a medical emergency, please immediately call 911 or go to the emergency department.  Pager Numbers  - Dr. Hester: [phone]  - Dr. Jackquline: [phone]  - Dr. Claudene: [phone]   - Dr. Raymund: [phone]  In the event of inclement weather, please call our main line at [phone] for an update on the status of any delays or closures.  Dermatology Medication Tips: Please keep the  boxes that topical medications come in in order to help keep track of the instructions about where and how to use these. Pharmacies typically print the medication instructions only on the boxes and not directly on the medication tubes.   If your medication is too expensive, please contact our office at [phone] option 4 or send us  a message through MyChart.   We are unable to tell what your co-pay for medications will be in advance as this is different depending on your insurance coverage. However, we may be able to find a substitute medication at lower cost or fill out paperwork to get insurance to cover a needed medication.   If a prior authorization is required to get your medication covered by your insurance company, please allow us  1-2 business days to complete this process.  Drug prices often vary depending on where the prescription is filled and some pharmacies may offer cheaper prices.  The website www.goodrx.com contains coupons for medications through different pharmacies. The prices here do not account for what the cost may be with help from insurance (it may be cheaper with your insurance), but the website can give you the price if you did not use any insurance.  - You can print the associated coupon and take it with your prescription to the pharmacy.  - You may also stop by our office during regular business hours and pick up a GoodRx coupon card.  - If you need your prescription sent electronically to a different pharmacy, notify our office through [facility] or by phone at [phone] option 4.     Si Usted Necesita Algo Despus de Su Visita  Tambin puede enviarnos un mensaje a travs de Clinical cytogeneticist. Por lo general respondemos a los mensajes de MyChart en el transcurso de 1 a 2 das hbiles.  Para renovar recetas, por favor pida a su farmacia que se ponga en contacto con nuestra oficina. Randi lakes de fax es [place] [phone].  Si tiene un asunto urgente cuando la  clnica est cerrada y que no puede esperar hasta el siguiente da hbil, puede llamar/localizar a su doctor(a) al nmero que aparece a continuacin.   Por favor, tenga en cuenta que aunque hacemos todo lo posible para estar disponibles para asuntos urgentes fuera del horario de [place], no estamos disponibles las 24 horas del da, los 7 [address]  [address] de la [place].   Si tiene un problema urgente y no puede comunicarse con nosotros, puede optar por buscar atencin mdica  en el consultorio de su doctor(a), en una clnica privada, en un centro de atencin urgente o en una sala de emergencias.  Si tiene Engineer, drilling, por favor llame inmediatamente al 911 o vaya a la sala de emergencias.  Nmeros de bper  - Dr. Hester: [phone]  - Dra. Jackquline: [phone]  - Dr. Claudene: [phone]  - Dra. Kitts: [phone]  En caso de inclemencias del tiempo, por favor llame a nuestra  lnea principal al [phone] para una actualizacin sobre el [place] de cualquier retraso o cierre.  Consejos para la medicacin en dermatologa: Por favor, guarde las cajas en las que vienen los medicamentos de uso tpico para ayudarle a seguir las instrucciones sobre dnde y cmo usarlos. Las farmacias generalmente imprimen las instrucciones del medicamento slo en las cajas y no directamente en los tubos del [place].   Si su medicamento es muy caro, por favor, pngase en contacto con landry rieger llamando al [phone] y presione la opcin 4 o envenos un mensaje a travs de Clinical cytogeneticist.   No podemos decirle cul ser su copago por los medicamentos por adelantado ya que esto es diferente dependiendo de la cobertura de su seguro. Sin embargo, es posible que podamos encontrar un medicamento sustituto a Audiological scientist un formulario para que el seguro cubra el medicamento que se considera necesario.   Si se requiere una autorizacin previa para que su compaa de seguros malta su medicamento, por favor  permtanos de 1 a 2 das hbiles para completar este proceso.  Los precios de los medicamentos varan con frecuencia dependiendo del Environmental consultant de dnde se surte la receta y alguna farmacias pueden ofrecer precios ms baratos.  El sitio web www.goodrx.com tiene cupones para medicamentos de Health and safety inspector. Los precios aqu no tienen en cuenta lo que podra costar con la ayuda del seguro (puede ser ms barato con su seguro), pero el sitio web puede darle el precio si no utiliz Tourist information centre manager.  - Puede imprimir el cupn correspondiente y llevarlo con su receta a la farmacia.  - Tambin puede pasar por nuestra oficina durante el horario de atencin regular y Education officer, museum una tarjeta de cupones de GoodRx.  - Si necesita que su receta se enve electrnicamente a una farmacia diferente, informe a nuestra oficina a travs de MyChart de [facility] o por telfono llamando al [phone] y presione la opcin 4.

## 2024-08-03 DIAGNOSIS — F5104 Psychophysiologic insomnia: Secondary | ICD-10-CM | POA: Diagnosis not present

## 2024-08-03 DIAGNOSIS — R4181 Age-related cognitive decline: Secondary | ICD-10-CM | POA: Diagnosis not present

## 2024-08-03 DIAGNOSIS — F419 Anxiety disorder, unspecified: Secondary | ICD-10-CM | POA: Diagnosis not present

## 2024-08-03 DIAGNOSIS — F329 Major depressive disorder, single episode, unspecified: Secondary | ICD-10-CM | POA: Diagnosis not present

## 2024-08-04 LAB — SURGICAL PATHOLOGY

## 2024-08-05 DIAGNOSIS — R278 Other lack of coordination: Secondary | ICD-10-CM | POA: Diagnosis not present

## 2024-08-09 ENCOUNTER — Encounter: Payer: Self-pay | Admitting: Dermatology

## 2024-08-09 ENCOUNTER — Ambulatory Visit: Payer: Self-pay | Admitting: Dermatology

## 2024-08-09 NOTE — Telephone Encounter (Signed)
 Advised pt of bx results/sh ?

## 2024-08-09 NOTE — Telephone Encounter (Signed)
-----   Message from Rexene Rattler sent at 08/09/2024  9:57 AM EDT ----- 1. Skin, left tragus :       BASAL CELL CARCINOMA, NODULAR PATTERN   BCC skin cancer- already treated with EDC at time of biopsy  - please call patient ----- Message ----- From: Interface, Lab In Three Zero One Sent: 08/04/2024   4:33 PM EDT To: Rexene Rattler, MD

## 2024-08-12 DIAGNOSIS — F320 Major depressive disorder, single episode, mild: Secondary | ICD-10-CM | POA: Diagnosis not present

## 2024-08-12 DIAGNOSIS — R5383 Other fatigue: Secondary | ICD-10-CM | POA: Diagnosis not present

## 2024-08-12 DIAGNOSIS — E785 Hyperlipidemia, unspecified: Secondary | ICD-10-CM | POA: Diagnosis not present

## 2024-08-12 DIAGNOSIS — R4189 Other symptoms and signs involving cognitive functions and awareness: Secondary | ICD-10-CM | POA: Diagnosis not present

## 2024-08-12 DIAGNOSIS — M6281 Muscle weakness (generalized): Secondary | ICD-10-CM | POA: Diagnosis not present

## 2024-08-12 DIAGNOSIS — R7301 Impaired fasting glucose: Secondary | ICD-10-CM | POA: Diagnosis not present

## 2024-08-19 ENCOUNTER — Encounter: Payer: Self-pay | Admitting: Family

## 2024-08-22 ENCOUNTER — Ambulatory Visit: Admitting: Dermatology

## 2024-08-22 DIAGNOSIS — L821 Other seborrheic keratosis: Secondary | ICD-10-CM | POA: Diagnosis not present

## 2024-08-22 DIAGNOSIS — D1801 Hemangioma of skin and subcutaneous tissue: Secondary | ICD-10-CM

## 2024-08-22 DIAGNOSIS — Z85820 Personal history of malignant melanoma of skin: Secondary | ICD-10-CM | POA: Diagnosis not present

## 2024-08-22 DIAGNOSIS — L820 Inflamed seborrheic keratosis: Secondary | ICD-10-CM

## 2024-08-22 DIAGNOSIS — Z85828 Personal history of other malignant neoplasm of skin: Secondary | ICD-10-CM

## 2024-08-22 DIAGNOSIS — Z86006 Personal history of melanoma in-situ: Secondary | ICD-10-CM | POA: Diagnosis not present

## 2024-08-22 NOTE — Telephone Encounter (Signed)
 Noted

## 2024-08-22 NOTE — Patient Instructions (Signed)

## 2024-08-22 NOTE — Progress Notes (Signed)
   Follow-Up Visit   Subjective  Jon Sherman. is a 89 y.o. male who presents for the following: inflamed sk of the abdomen treated 08/02/24, but hasn't cleared.    The following portions of the chart were reviewed this encounter and updated as appropriate: medications, allergies, medical history  Review of Systems:  No other skin or systemic complaints except as noted in HPI or Assessment and Plan.  Objective  Well appearing patient in no apparent distress; mood and affect are within normal limits.  A focused examination was performed of the following areas: Abdomen  Relevant physical exam findings are noted in the Assessment and Plan.    Assessment & Plan   INFLAMED SEBORRHEIC KERATOSIS Exam: mid abdomen pink scaly papule c/w healing post cryotherapy  May apply Vaseline to keep moist. Sample given. Benign-appearing.  Reassured patient that healing still in process. Call clinic for new or changing lesions. If patient still concerned and not clear in 1 month, patient to return.   SEBORRHEIC KERATOSIS - Stuck-on, waxy, tan-brown papules and/or plaques abdomen - Benign-appearing - Discussed benign etiology and prognosis. - Observe - Call for any changes  HEMANGIOMA Exam: red papule(s) Discussed benign nature. Recommend observation. Call for changes.   HISTORY OF MELANOMA AND MIS - No evidence of recurrence today - Recommend regular full body skin exams - Recommend daily broad spectrum sunscreen SPF 30+ to sun-exposed areas, reapply every 2 hours as needed.  - Call if any new or changing lesions are noted between office visits   HISTORY OF BASAL CELL CARCINOMA OF THE SKIN - No evidence of recurrence today - Recommend regular full body skin exams - Recommend daily broad spectrum sunscreen SPF 30+ to sun-exposed areas, reapply every 2 hours as needed.  - Call if any new or changing lesions are noted between office visits     Return as scheduled, for 6 month  follow-up.  IAndrea Kerns, CMA, am acting as scribe for Rexene Rattler, MD .   Documentation: I have reviewed the above documentation for accuracy and completeness, and I agree with the above.  Rexene Rattler, MD

## 2024-08-30 DIAGNOSIS — E785 Hyperlipidemia, unspecified: Secondary | ICD-10-CM | POA: Diagnosis not present

## 2024-08-30 DIAGNOSIS — F320 Major depressive disorder, single episode, mild: Secondary | ICD-10-CM | POA: Diagnosis not present

## 2024-08-30 DIAGNOSIS — R4189 Other symptoms and signs involving cognitive functions and awareness: Secondary | ICD-10-CM | POA: Diagnosis not present

## 2024-08-30 DIAGNOSIS — R5383 Other fatigue: Secondary | ICD-10-CM | POA: Diagnosis not present

## 2024-08-30 DIAGNOSIS — R7301 Impaired fasting glucose: Secondary | ICD-10-CM | POA: Diagnosis not present

## 2024-08-30 DIAGNOSIS — R278 Other lack of coordination: Secondary | ICD-10-CM | POA: Diagnosis not present

## 2024-08-30 DIAGNOSIS — R2681 Unsteadiness on feet: Secondary | ICD-10-CM | POA: Diagnosis not present

## 2024-08-30 DIAGNOSIS — R2689 Other abnormalities of gait and mobility: Secondary | ICD-10-CM | POA: Diagnosis not present

## 2024-08-30 DIAGNOSIS — M6281 Muscle weakness (generalized): Secondary | ICD-10-CM | POA: Diagnosis not present

## 2024-08-31 DIAGNOSIS — F5104 Psychophysiologic insomnia: Secondary | ICD-10-CM | POA: Diagnosis not present

## 2024-08-31 DIAGNOSIS — R4181 Age-related cognitive decline: Secondary | ICD-10-CM | POA: Diagnosis not present

## 2024-08-31 DIAGNOSIS — F411 Generalized anxiety disorder: Secondary | ICD-10-CM | POA: Diagnosis not present

## 2024-08-31 DIAGNOSIS — F329 Major depressive disorder, single episode, unspecified: Secondary | ICD-10-CM | POA: Diagnosis not present

## 2024-09-02 ENCOUNTER — Telehealth: Payer: Self-pay

## 2024-09-02 NOTE — Telephone Encounter (Signed)
 Copied from CRM [id number]. Topic: General - Other >> Sep 02, 2024 11:46 AM Nessti S wrote: Reason for CRM: debbie from [facility] was calling for Dr Marylynn concerning a fax order for flu and covid shot. Call back number [phone] or send back fax

## 2024-09-02 NOTE — Telephone Encounter (Signed)
 Received, signed and faxed back. Marval is aware.

## 2024-09-08 ENCOUNTER — Telehealth: Payer: Self-pay | Admitting: Nurse Practitioner

## 2024-09-08 ENCOUNTER — Ambulatory Visit: Admitting: Nurse Practitioner

## 2024-09-08 ENCOUNTER — Encounter: Payer: Self-pay | Admitting: Nurse Practitioner

## 2024-09-08 VITALS — BP 114/68 | HR 67 | Temp 97.6°F | Ht 68.0 in | Wt 173.0 lb

## 2024-09-08 DIAGNOSIS — F320 Major depressive disorder, single episode, mild: Secondary | ICD-10-CM

## 2024-09-08 DIAGNOSIS — R2681 Unsteadiness on feet: Secondary | ICD-10-CM

## 2024-09-08 MED ORDER — SERTRALINE HCL 100 MG PO TABS
100.0000 mg | ORAL_TABLET | Freq: Every day | ORAL | 1 refills | Status: AC
Start: 2024-09-08 — End: ?

## 2024-09-08 NOTE — Patient Instructions (Addendum)
 GAIT INSTABILITY: You have been experiencing balance issues, especially when navigating curbs and walking outside. You use a cane for support when outside but not inside. -We will refer you to physical therapy at [facility] to help improve your balance.  Depression: Increased the dose of Zoloft  from 50 to 100 mg

## 2024-09-08 NOTE — Progress Notes (Signed)
 Established Patient Office Visit  Subjective:  Patient ID: Jon Sherman., male    DOB: 02/04/1928  Age: 89 y.o. MRN: [id number]  CC:  Chief Complaint  Patient presents with   Acute Visit    Balance off  Patient does not think the sleep medication is helping but thinks the Tylenol  PM works Great for me   Discussed the use of a AI scribe software for clinical note transcription with the patient, who gave verbal consent to proceed.  HPI  Jon Sherman. presentswith balance issues.  He experiences balance issues, particularly when navigating curbs, and uses a cane for support primarily when outside his apartment. He does not experience dizziness or lightheadedness when sitting or getting in and out of a car. He resides at [facility] and occasionally walks to visit his wife at [facility] within the same community. He is concerned about managing in winter weather and is considering the use of a walker, as is common in his community.  Patient also mentioned that the sleep medication he is using is not really working but thinks Tylenol  PM helps better for him to perform a sleep.  He may have passive thoughts of better off and regarding plans.     09/08/2024   11:44 AM 05/17/2024   10:24 AM 05/05/2024    2:10 PM 04/19/2024   11:06 AM 04/15/2024    3:16 PM  Depression screen PHQ 2/9  Decreased Interest 1 2 2 1 1   Down, Depressed, Hopeless 1 2 2 2 2   PHQ - 2 Score 2 4 4 3 3   Altered sleeping 1 1 1 1 1   Tired, decreased energy 2 3 3 3 3   Change in appetite 0 0 0 0 0  Feeling bad or failure about yourself  1 1 1 2 1   Trouble concentrating 0 0 0 0 1  Moving slowly or fidgety/restless 0 0 0 0 0  Suicidal thoughts 2 0 2 1 1   PHQ-9 Score 8 9 11 10 10   Difficult doing work/chores Not difficult at all Somewhat difficult Somewhat difficult Somewhat difficult Somewhat difficult     HPI   Past Medical History:  Diagnosis Date   Basal cell carcinoma 06/29/2018   right  forehead   Basal cell carcinoma 01/06/2023   L nasal ala, MOHs completed 04/02/23   Basal cell carcinoma 07/29/2023   right preauricular, EDC   Basal cell carcinoma 08/02/2024   left tragus, EDC   Cancer (HCC) 1965   melanoma, LNs resected   COPD (chronic obstructive pulmonary disease) (HCC)    Infectious colitis 2000   requiring hospitalization   Influenza 02/18/2018   Melanoma (HCC) 1964   right abdomen   Melanoma in situ (HCC) 10/09/2020   right ear helix, MOHs 12/10/20 with Dr. Bluford   Melanoma in situ of right lower extremity (HCC) 07/05/2018   right medial knee. Excised, margins free.   Melanoma of face (HCC) 08/20/2020   right lower cheek 0.65mm, Clarks Level II, Mohs with Dr. Bluford 09/28/20   Rosacea    Vertigo    1 episode (2019)    Past Surgical History:  Procedure Laterality Date   basal cell removal on nose  03/2023   on nose   CATARACT EXTRACTION W/PHACO Right 06/29/2019   Procedure: CATARACT EXTRACTION PHACO AND INTRAOCULAR LENS PLACEMENT (IOC) RIGHT;  Surgeon: Mittie Gaskin, MD;  Location: [facility];  Service: Ophthalmology;  Laterality: Right;   CATARACT EXTRACTION W/PHACO Left 08/31/2019  Procedure: CATARACT EXTRACTION PHACO AND INTRAOCULAR LENS PLACEMENT (IOC) LEFT 02:06.7  25.2%  32.01;  Surgeon: Mittie Gaskin, MD;  Location: [facility];  Service: Ophthalmology;  Laterality: Left;   MELANOMA EXCISION WITH SENTINEL LYMPH NODE BIOPSY     SOFT TISSUE TUMOR RESECTION  11/18/1983    Family History  Problem Relation Age of Onset   Mental illness Mother    Heart failure Mother    Heart failure Father    Rheumatic fever Sister    Cancer Neg Hx    Drug abuse Neg Hx     Social History   Socioeconomic History   Marital status: Married    Spouse name: Not on file   Number of children: Not on file   Years of education: Not on file   Highest education level: Not on file  Occupational History   Not on file  Tobacco Use    Smoking status: Former    Current packs/day: 0.00    Average packs/day: 1 pack/day for 10.0 years (10.0 ttl pk-yrs)    Types: Cigarettes    Start date: 02/23/1952    Quit date: 02/22/1962    Years since quitting: 62.6   Smokeless tobacco: Never  Vaping Use   Vaping status: Never Used  Substance and Sexual Activity   Alcohol use: Yes    Alcohol/week: 7.0 standard drinks of alcohol    Types: 7 Standard drinks or equivalent per week    Comment: Occasional wine and beer with meal    Drug use: No   Sexual activity: Never  Other Topics Concern   Not on file  Social History Narrative   Married    Lives in [place] >10 years as of 02/2018    Social Drivers of Health   Financial Resource Strain: Low Risk  (05/17/2024)   Overall Financial Resource Strain (CARDIA)    Difficulty of Paying Living Expenses: Not hard at all  Food Insecurity: No Food Insecurity (05/17/2024)   Hunger Vital Sign    Worried About Running Out of Food in the Last Year: Never true    Ran Out of Food in the Last Year: Never true  Transportation Needs: No Transportation Needs (05/17/2024)   PRAPARE - Administrator, Civil Service (Medical): No    Lack of Transportation (Non-Medical): No  Physical Activity: Insufficiently Active (05/17/2024)   Exercise Vital Sign    Days of Exercise per Week: 3 days    Minutes of Exercise per Session: 20 min  Stress: Stress Concern Present (05/17/2024)   [employer] of Occupational Health - Occupational Stress Questionnaire    Feeling of Stress: Very much  Social Connections: Moderately Integrated (05/17/2024)   Social Connection and Isolation Panel    Frequency of Communication with Friends and Family: More than three times a week    Frequency of Social Gatherings with Friends and Family: More than three times a week    Attends Religious Services: More than 4 times per year    Active Member of [employer] or Organizations: No    Attends Banker Meetings: Never     Marital Status: Married  Catering Manager Violence: Not At Risk (05/17/2024)   Humiliation, Afraid, Rape, and Kick questionnaire    Fear of Current or Ex-Partner: No    Emotionally Abused: No    Physically Abused: No    Sexually Abused: No     Outpatient Medications Prior to Visit  Medication Sig Dispense Refill   acetaminophen  (  TYLENOL ) 325 MG tablet Take 650 mg by mouth every 4 (four) hours as needed.     alum & mag hydroxide-simeth (MAALOX/MYLANTA) 200-200-20 MG/5 SUSP Give 2 Tbsp by mouth every 4 hours as needed for gas, indigestion, or upset stomach supervised self-administration Notify MD if no relief     bismuth subsalicylate (PEPTO BISMOL) 262 MG/15ML suspension 10 cc by mouth as needed for loose stool supervised self administration     cetirizine (ZYRTEC) 5 MG tablet Take 5 mg by mouth as needed for allergies.     cyanocobalamin  (VITAMIN B12) 1000 MCG/ML injection Inject 1 mL once a  month 3 mL 3   dextromethorphan-guaiFENesin (ROBITUSSIN-DM) 10-100 MG/5ML liquid Give 2 tsp by mouth every 4 hours as needed for coughing. supervised self-administration Notify MD if no relief in 3 days or continued use     diphenhydramine-acetaminophen  (TYLENOL  PM) 25-500 MG TABS tablet Take 1 tablet by mouth at bedtime as needed.     Glucose 15 GM/32ML GEL Take 1 packet by mouth as needed.     magnesium hydroxide (MILK OF MAGNESIA) 400 MG/5ML suspension Give 2 Tbsp by mouth as needed for constipation. supervised self-administration daily  Call MD if no relief in 3 days of continued use     metroNIDAZOLE  (METROGEL ) 0.75 % gel APPLY A THIN COAT TO THE FACE EVERY DAY 45 g 0   Multiple Vitamins-Minerals (PRESERVISION AREDS 2) CAPS Take 1 capsule by mouth 2 (two) times daily.     nystatin (MYCOSTATIN/NYSTOP) powder Apply to excoriated area topically as needed for skin breakdown supervised selfadministration twice daily     ondansetron (ZOFRAN) 4 MG tablet Take 4 mg by mouth daily as needed for nausea  or vomiting.     OXYGEN Inhale 2 L into the lungs. May start for dyspnea or SOB     sertraline  (ZOLOFT ) 50 MG tablet TAKE 1 TABLET BY MOUTH EVERY DAY 90 tablet 1   No facility-administered medications prior to visit.    Allergies  Allergen Reactions   Demerol     Caused patient to feel nauseated   Meperidine     Other reaction(s): Unknown    ROS Review of Systems Negative unless indicated in HPI.    Objective:    Physical Exam Constitutional:      Appearance: Normal appearance.  HENT:     Mouth/Throat:     Mouth: Mucous membranes are moist.  Eyes:     Conjunctiva/sclera: Conjunctivae normal.     Pupils: Pupils are equal, round, and reactive to light.  Cardiovascular:     Rate and Rhythm: Normal rate and regular rhythm.     Pulses: Normal pulses.     Heart sounds: Normal heart sounds.  Pulmonary:     Effort: Pulmonary effort is normal.     Breath sounds: Normal breath sounds.  Musculoskeletal:        General: No tenderness.     Cervical back: Normal range of motion. No tenderness.  Skin:    General: Skin is warm.     Findings: No bruising.  Neurological:     General: No focal deficit present.     Mental Status: He is alert and oriented to person, place, and time. Mental status is at baseline.     Gait: Gait abnormal (Uses cane for ambulation).  Psychiatric:        Mood and Affect: Mood normal.        Behavior: Behavior normal.  Thought Content: Thought content normal.        Judgment: Judgment normal.     BP 114/68   Pulse 67   Temp 97.6 F (36.4 C)   Ht 5' 8 (1.727 m)   Wt 173 lb (78.5 kg)   SpO2 98%   BMI 26.30 kg/m  Wt Readings from Last 3 Encounters:  09/08/24 173 lb (78.5 kg)  06/20/24 173 lb (78.5 kg)  05/17/24 173 lb (78.5 kg)     Health Maintenance  Topic Date Due   Zoster Vaccines- Shingrix (2 of 2) 04/08/2018   COVID-19 Vaccine (8 - 2025-26 season) 09/23/2024 (Originally 07/18/2024)   DTaP/Tdap/Td (3 - Td or Tdap) 01/19/2025  (Originally 07/27/2023)   Influenza Vaccine  02/14/2025 (Originally 06/17/2024)   Medicare Annual Wellness (AWV)  05/17/2025   Pneumococcal Vaccine: 50+ Years  Completed   Meningococcal B Vaccine  Aged Out    There are no preventive care reminders to display for this patient.  Lab Results  Component Value Date   TSH 1.79 10/06/2023   Lab Results  Component Value Date   WBC 5.7 10/06/2023   HGB 14.4 10/06/2023   HCT 43.0 10/06/2023   MCV 96.4 10/06/2023   PLT 233.0 10/06/2023   Lab Results  Component Value Date   NA 138 03/24/2024   K 4.4 03/24/2024   CO2 30 03/24/2024   GLUCOSE 92 03/24/2024   BUN 20 03/24/2024   CREATININE 1.15 03/24/2024   BILITOT 0.6 03/24/2024   ALKPHOS 61 03/24/2024   AST 10 03/24/2024   ALT 10 03/24/2024   PROT 5.9 (L) 03/24/2024   ALBUMIN 4.1 03/24/2024   CALCIUM 8.7 03/24/2024   GFR 54.70 (L) 03/24/2024   Lab Results  Component Value Date   CHOL 162 10/06/2023   Lab Results  Component Value Date   HDL 53.40 10/06/2023   Lab Results  Component Value Date   LDLCALC 85 10/06/2023   Lab Results  Component Value Date   TRIG 119.0 10/06/2023   Lab Results  Component Value Date   CHOLHDL 3 10/06/2023   Lab Results  Component Value Date   HGBA1C 5.6 10/06/2023      Assessment & Plan:  Gait instability Assessment & Plan: Gait instability when navigating curbs or walking outside. Uses a cane outside but not inside.  Denise dizziness, lightheadedness. Interested in physical therapy for balance improvement. - Refer to physical therapy at [facility] for balance improvement.   Depression, major, single episode, mild Assessment & Plan: He is on zoloft  50 mg. Flowsheet Row Office Visit from 09/08/2024 in [facility] [employer] Clinical Support from 05/17/2024 in [facility] [employer] Visit from 05/05/2024 in [facility] [employer]  Thoughts  that you would be better off dead, or of hurting yourself in some way More than half the days Not at all More than half the days  PHQ-9 Total Score 8 9 11    -Depression due to wife in memory care and his advanced age.  He is 89 year old and reports no one in his family has lived this long before. -Will increase Zoloft  to 100 mg daily.     Other orders -     Sertraline  HCl; Take 1 tablet (100 mg total) by mouth daily.  Dispense: 90 tablet; Refill: 1    Follow-up: Return in about 1 month (around 10/09/2024) for depression.   Baudelia Schroepfer, NP

## 2024-09-08 NOTE — Assessment & Plan Note (Signed)
 Gait instability when navigating curbs or walking outside. Uses a cane outside but not inside.  Denise dizziness, lightheadedness. Interested in physical therapy for balance improvement. - Refer to physical therapy at [facility] for balance improvement.

## 2024-09-09 ENCOUNTER — Telehealth: Payer: Self-pay

## 2024-09-09 NOTE — Telephone Encounter (Signed)
 Copied from CRM [id number]. Topic: Clinical - Medical Advice >> Sep 09, 2024 11:54 AM Nessti S wrote: Reason for CRM: peter would like to discontinue trazdone 2 50mg  being that pt was prescribed tylenol  pm and would like it faxed to [phone]

## 2024-09-12 DIAGNOSIS — N1831 Chronic kidney disease, stage 3a: Secondary | ICD-10-CM | POA: Diagnosis not present

## 2024-09-12 DIAGNOSIS — R32 Unspecified urinary incontinence: Secondary | ICD-10-CM | POA: Diagnosis not present

## 2024-09-12 DIAGNOSIS — E559 Vitamin D deficiency, unspecified: Secondary | ICD-10-CM | POA: Diagnosis not present

## 2024-09-12 DIAGNOSIS — F324 Major depressive disorder, single episode, in partial remission: Secondary | ICD-10-CM | POA: Diagnosis not present

## 2024-09-12 DIAGNOSIS — E539 Vitamin B deficiency, unspecified: Secondary | ICD-10-CM | POA: Diagnosis not present

## 2024-09-12 DIAGNOSIS — D320 Benign neoplasm of cerebral meninges: Secondary | ICD-10-CM | POA: Diagnosis not present

## 2024-09-12 DIAGNOSIS — L309 Dermatitis, unspecified: Secondary | ICD-10-CM | POA: Diagnosis not present

## 2024-09-12 DIAGNOSIS — H353290 Exudative age-related macular degeneration, unspecified eye, stage unspecified: Secondary | ICD-10-CM | POA: Diagnosis not present

## 2024-09-12 DIAGNOSIS — F4320 Adjustment disorder, unspecified: Secondary | ICD-10-CM | POA: Diagnosis not present

## 2024-09-13 NOTE — Telephone Encounter (Signed)
 Please inform Maude, RN the medication was not changed to tylenol  PM. The only medication change was Zoloft  increased to 100 mg.

## 2024-09-14 NOTE — Telephone Encounter (Signed)
 Copied from CRM #[id number]. Topic: General - Call Back - No Documentation >> Sep 14, 2024  2:31 PM Alfonso ORN wrote: Reason for CRM: Maude from [facility] called back to return call from cma . Relayed message regarding Tylenol . He  would like to confirm if Trazdone should be continued . Please call Maude to confirm

## 2024-09-14 NOTE — Telephone Encounter (Signed)
 Left message for Maude from [facility] to call back.

## 2024-09-14 NOTE — Telephone Encounter (Addendum)
 Spoke with Maude at [facility] and advised patient should be taking Tylenol  PM 25/500 mg nightly and Zoloft  100 mg daily only. Peter CHARITY FUNDRAISER at [employer] is faxing over order to  DC trazodone  and to continue tylenol  PM for PCP to sign.

## 2024-09-14 NOTE — Telephone Encounter (Signed)
 Spoke with Maude at [facility] and he advised they have a arts development officer and a therapist on site and he will recommend psychologist and social worker see patient. Plus will try getting patient involved in more group activities.

## 2024-09-14 NOTE — Telephone Encounter (Signed)
 Order has been signed and faxed back with correct directions. Also spoke with Maude, RN to let him know of the correct medication order.

## 2024-09-15 NOTE — Telephone Encounter (Signed)
 Noted! Thank you

## 2024-09-15 NOTE — Assessment & Plan Note (Signed)
 He is on zoloft  50 mg. Flowsheet Row Office Visit from 09/08/2024 in [facility] [place] [employer] Clinical Support from 05/17/2024 in [facility] [employer] Visit from 05/05/2024 in [facility] [employer]  Thoughts that you would be better off dead, or of hurting yourself in some way More than half the days Not at all More than half the days  PHQ-9 Total Score 8 9 11    -Depression due to wife in memory care and his advanced age.  He is 89 year old and reports no one in his family has lived this long before. -Will increase Zoloft  to 100 mg daily.

## 2024-09-29 DIAGNOSIS — R4181 Age-related cognitive decline: Secondary | ICD-10-CM | POA: Diagnosis not present

## 2024-09-29 DIAGNOSIS — F5104 Psychophysiologic insomnia: Secondary | ICD-10-CM | POA: Diagnosis not present

## 2024-09-29 DIAGNOSIS — F329 Major depressive disorder, single episode, unspecified: Secondary | ICD-10-CM | POA: Diagnosis not present

## 2024-09-29 DIAGNOSIS — F411 Generalized anxiety disorder: Secondary | ICD-10-CM | POA: Diagnosis not present

## 2024-10-04 NOTE — Telephone Encounter (Signed)
 open in error

## 2024-10-07 ENCOUNTER — Telehealth: Payer: Self-pay

## 2024-10-07 ENCOUNTER — Encounter: Payer: Self-pay | Admitting: Internal Medicine

## 2024-10-07 NOTE — Telephone Encounter (Signed)
 Jon Sherman.  P [facility] [place] Admin46 minutes ago (11:58 AM)    Appointment canceled for Jon Sherman. ([id number]) Visit type: OFFICE VISIT 10/11/2024 1:40 PM (20 minutes) with Chelsea Aurora in [facility]   Reason for cancellation: Canceled via MyChart   Patient comments: Hello, My father Jon Sherman  has  moved into [facility] at [facility] and has decided to use the in-house doctor [name], DO) for his medical care.  Therefore, I'm canceling this followup appointment. Isaiah Era

## 2024-10-11 ENCOUNTER — Encounter: Payer: Self-pay | Admitting: Nurse Practitioner

## 2024-10-11 ENCOUNTER — Non-Acute Institutional Stay: Payer: Self-pay | Admitting: Nurse Practitioner

## 2024-10-11 ENCOUNTER — Ambulatory Visit: Admitting: Nurse Practitioner

## 2024-10-11 DIAGNOSIS — E538 Deficiency of other specified B group vitamins: Secondary | ICD-10-CM

## 2024-10-11 DIAGNOSIS — E785 Hyperlipidemia, unspecified: Secondary | ICD-10-CM | POA: Diagnosis not present

## 2024-10-11 DIAGNOSIS — K5903 Drug induced constipation: Secondary | ICD-10-CM | POA: Diagnosis not present

## 2024-10-11 DIAGNOSIS — E559 Vitamin D deficiency, unspecified: Secondary | ICD-10-CM | POA: Diagnosis not present

## 2024-10-11 DIAGNOSIS — F5102 Adjustment insomnia: Secondary | ICD-10-CM | POA: Diagnosis not present

## 2024-10-11 DIAGNOSIS — F331 Major depressive disorder, recurrent, moderate: Secondary | ICD-10-CM | POA: Diagnosis not present

## 2024-10-11 DIAGNOSIS — F09 Unspecified mental disorder due to known physiological condition: Secondary | ICD-10-CM

## 2024-10-11 DIAGNOSIS — L309 Dermatitis, unspecified: Secondary | ICD-10-CM | POA: Diagnosis not present

## 2024-10-11 NOTE — Progress Notes (Signed)
 Location:  Other [facility].  Nursing Home Room Number: [place] [place] [id number] Place of Service:  ALF (13)  Sherman, Harlene POUR, NP  Patient Care Team: Sherman Harlene POUR, NP as PCP - General (Geriatric Sherman) Marylynn Verneita CROME, MD (Internal Sherman)  Extended Emergency Contact Information Primary Emergency Contact: [facility] Address: [address] [place], [place] [address] [place] Home Phone: [phone] Mobile Phone: [phone] Relation: Spouse Secondary Emergency Contact: [facility] Address: [address]          [place], [place] [address] [place] Home Phone: [phone] Mobile Phone: [phone] Relation: Daughter  Goals of care: Advanced Directive information    10/11/2024    2:44 PM  Advanced Directives  Does Patient Have a Medical Advance Directive? Yes  Type of Advance Directive Living will;Out of facility DNR (pink MOST or yellow form)  Does patient want to make changes to medical advance directive? No - Patient declined     Chief Complaint  Patient presents with   Establish Care    Establish Care.     HPI:  Pt is a 89 y.o. male seen today to Establish Care. [facility] has ben living [facility] [place] [facility] and wanting to transfer care to [facility].   The patient has a history of melanoma and is under the care of a dermatologist. He has undergone surgical interventions for melanoma and cataracts.  He experiences moderate to severe depression and is on 100 mg of Zoloft . He is uncertain about the medication's effectiveness but has a good support system.  He has a history of high cholesterol, which is well-controlled with diet. Recent lab work in August showed good cholesterol levels.  He takes Tylenol  PM nightly to aid sleep due to difficulty falling asleep and nocturia. He reports occasional constipation and uses Tylenol  PM for sleep rather than pain relief.  He receives monthly B12  injections due to b12 def  he takes 50,000 units of vitamin D  weekly for a deficiency.   His diet includes a full breakfast and lunch, but he eats very little at night, typically just a small carton of applesauce. His weight has been stable around 173 pounds.  Memory issues are present, but he can perform daily activities such as dressing and bathing independently. He has been living in assisted living since July and is considering giving up driving due to memory concerns.   Past Medical History:  Diagnosis Date   Basal cell carcinoma 06/29/2018   right forehead   Basal cell carcinoma 01/06/2023   L nasal ala, MOHs completed 04/02/23   Basal cell carcinoma 07/29/2023   right preauricular, EDC   Basal cell carcinoma 08/02/2024   left tragus, ED[facility]   Cancer (HCC) 1965   melanoma, LNs resected   COPD (chronic obstructive pulmonary disease) (HCC)    Infectious colitis 2000   requiring hospitalization   Influenza 02/18/2018   Melanoma (HCC) 1964   right abdomen   Melanoma in situ (HCC) 10/09/2020   right ear helix, MOHs 12/10/20 with Dr. Bluford   Melanoma in situ of right lower extremity (HCC) 07/05/2018   right medial knee. Excised, margins free.   Melanoma of face (HCC) 08/20/2020   right lower cheek 0.61mm, Clarks Level II, Mohs with Dr. Bluford 09/28/20   Rosacea    Vertigo    1 episode (2019)   Past Surgical History:  Procedure Laterality Date   basal  cell removal on nose  03/2023   on nose   CATARACT EXTRACTION W/PHACO Right 06/29/2019   Procedure: CATARACT EXTRACTION PHACO AND INTRAOCULAR LENS PLACEMENT (IOC) RIGHT;  Surgeon: Mittie Gaskin, MD;  Location[facility];  Service: Ophthalmology;  Laterality: Right;   CATARACT EXTRACTION W/PHACO Left 08/31/2019   Procedure: CATARACT EXTRACTION PHACO AND INTRAOCULAR LENS PLACEMENT (IOC) LEFT 02:06.7  25.2%  32.01;  Surgeon: Mittie Gaskin, MD;  Location: [facility];  Service: Ophthalmology;  Laterality:  Left;   MELANOMA EXCISION WITH SENTINEL LYMPH NODE BIOPSY     SOFT TISSUE TUMOR RESECTION  11/18/1983    Allergies  Allergen Reactions   Demerol     Caused patient to feel nauseated   Meperidine     Other reaction(s): Unknown    Outpatient Encounter Medications as of 10/11/2024  Medication Sig   acetaminophen  (TYLENOL ) 325 MG tablet Take 650 mg by mouth every 4 (four) hours as needed.   alum & mag hydroxide-simeth (MAALOX/MYLANTA) 200-200-20 MG/5 SUSP Give 2 Tbsp by mouth every 4 hours as needed for gas, indigestion, or upset stomach supervised self-administration Notify MD if no relief   bismuth subsalicylate (PEPTO BISMOL) 262 MG/15ML suspension 10 cc by mouth as needed for loose stool supervised self administration   carbamide peroxide (DEBROX) 6.5 % OTIC solution Place 5 drops into both ears as needed.   cetirizine (ZYRTEC) 5 MG tablet Take 5 mg by mouth as needed for allergies.   cyanocobalamin  (VITAMIN B12) 1000 MCG/ML injection Inject 1 mL once a  month   dextromethorphan-guaiFENesin (ROBITUSSIN-DM) 10-100 MG/5ML liquid Give 2 tsp by mouth every 4 hours as needed for coughing. supervised self-administration Notify MD if no relief in 3 days or continued use   diphenhydramine-acetaminophen  (TYLENOL  PM) 25-500 MG TABS tablet Take 1 tablet by mouth at bedtime as needed.   Glucose 15 GM/32ML GEL Take 1 packet by mouth as needed.   magnesium hydroxide (MILK OF MAGNESIA) 400 MG/5ML suspension Give 2 Tbsp by mouth as needed for constipation. supervised self-administration daily  Call MD if no relief in 3 days of continued use   metroNIDAZOLE  (METROGEL ) 0.75 % gel APPLY A THIN COAT TO THE FACE EVERY DAY   Multiple Vitamins-Minerals (PRESERVISION AREDS 2) CAPS Take 1 capsule by mouth 2 (two) times daily.   nystatin (MYCOSTATIN/NYSTOP) powder Apply to excoriated area topically as needed for skin breakdown supervised selfadministration twice daily   ondansetron (ZOFRAN) 4 MG tablet Take  4 mg by mouth daily as needed for nausea or vomiting.   OXYGEN Inhale 2 L into the lungs. May start for dyspnea or SOB   sertraline  (ZOLOFT ) 100 MG tablet Take 1 tablet (100 mg total) by mouth daily.   Vitamin D , Ergocalciferol , (DRISDOL ) 1.25 MG (50000 UNIT) CAPS capsule Take 50,000 Units by mouth every 7 (seven) days.   sertraline  (ZOLOFT ) 50 MG tablet TAKE 1 TABLET BY MOUTH EVERY DAY (Patient not taking: Reported on 10/11/2024)   No facility-administered encounter medications on file as of 10/11/2024.    Review of Systems  Constitutional:  Negative for activity change, appetite change, fatigue and unexpected weight change.  HENT:  Negative for congestion and hearing loss.   Eyes: Negative.   Respiratory:  Negative for cough and shortness of breath.   Cardiovascular:  Negative for chest pain, palpitations and leg swelling.  Gastrointestinal:  Positive for constipation. Negative for abdominal pain and diarrhea.  Genitourinary:  Negative for difficulty urinating and dysuria.  Musculoskeletal:  Negative for  arthralgias and myalgias.  Skin:  Negative for color change and wound.  Neurological:  Negative for dizziness and weakness.  Psychiatric/Behavioral:  Negative for agitation, behavioral problems and confusion.      Immunization History  Administered Date(s) Administered   Fluad Quad(high Dose 65+) 08/26/2019, 08/27/2020, 08/21/2021   INFLUENZA, HIGH DOSE SEASONAL PF 09/01/2023   Influenza Split 09/12/2013   Influenza-Unspecified 09/17/2012, 09/15/2014, 08/19/2022, 08/30/2024   Moderna Covid-19 Fall Seasonal Vaccine 32yrs & older 09/26/2022   Moderna Covid-19 Vaccine Bivalent Booster 49yrs & up 10/11/2021   Moderna Sars-Covid-2 Vaccination 12/02/2019, 12/30/2019, 10/02/2020, 04/04/2021   PNEUMOCOCCAL CONJUGATE-20 09/19/2022   Pfizer Covid-19 Vaccine Bivalent Booster 78yrs & up 04/15/2022   Pneumococcal Conjugate-13 11/20/2014   Pneumococcal Polysaccharide-23 11/20/2005   Tdap  02/22/2013, 07/26/2013   Unspecified SARS-COV-2 Vaccination 09/09/2024   Zoster Recombinant(Shingrix) 02/11/2018   Pertinent  Health Maintenance Due  Topic Date Due   Influenza Vaccine  Completed      04/15/2024    3:16 PM 04/19/2024   11:06 AM 05/05/2024    2:10 PM 05/17/2024   10:20 AM 09/08/2024   11:44 AM  Fall Risk  Falls in the past year? 1 1 1  0 1  Was there an injury with Fall? 0 0 0 0 0  Fall Risk Category Calculator 1 1 1  0 1  Patient at Risk for Falls Due to History of fall(s) History of fall(s) History of fall(s) No Fall Risks History of fall(s)  Fall risk Follow up Falls evaluation completed Falls evaluation completed Falls evaluation completed Falls evaluation completed;Falls prevention discussed Falls evaluation completed   Functional Status Survey:    Vitals:   10/11/24 1425  BP: (!) 140/61  Pulse: (!) 56  Resp: 17  Temp: 97.8 F (36.6 C)  SpO2: 96%  Weight: 173 lb (78.5 kg)  Height: 5' 8 (1.727 m)   Body mass index is 26.3 kg/m.  Wt Readings from Last 3 Encounters:  10/11/24 173 lb (78.5 kg)  09/08/24 173 lb (78.5 kg)  06/20/24 173 lb (78.5 kg)    Physical Exam Constitutional:      General: He is not in acute distress.    Appearance: He is well-developed. He is not diaphoretic.  HENT:     Head: Normocephalic and atraumatic.     Right Ear: External ear normal.     Left Ear: External ear normal.     Mouth/Throat:     Pharynx: No oropharyngeal exudate.  Eyes:     Conjunctiva/sclera: Conjunctivae normal.     Pupils: Pupils are equal, round, and reactive to light.  Cardiovascular:     Rate and Rhythm: Normal rate and regular rhythm.     Heart sounds: Normal heart sounds.  Pulmonary:     Effort: Pulmonary effort is normal.     Breath sounds: Normal breath sounds.  Abdominal:     General: Bowel sounds are normal.     Palpations: Abdomen is soft.  Musculoskeletal:        General: No tenderness.     Cervical back: Normal range of motion and neck  supple.     Right lower leg: No edema.     Left lower leg: No edema.  Skin:    General: Skin is warm and dry.     Comments: Patchy, rough red areas to lower legs/ankles bilateral.   Neurological:     Mental Status: He is alert and oriented to person, place, and time.     Motor: No weakness.  Psychiatric:        Mood and Affect: Mood normal.     Labs reviewed: Recent Labs    03/24/24 1110 06/23/24 0000  NA 138 140  K 4.4 3.9  CL 102 108  CO2 30 26*  GLUCOSE 92  --   BUN 20 24*  CREATININE 1.15 1.2  CALCIUM 8.7 8.6*   Recent Labs    03/24/24 1110 06/23/24 0000  AST 10 9*  ALT 10 9*  ALKPHOS 61 57  BILITOT 0.6  --   PROT 5.9*  --   ALBUMIN 4.1 3.3*   Recent Labs    06/23/24 0000  WBC 5.3  NEUTROABS 3,291.00  HGB 11.7*  HCT 36*  PLT 185   Lab Results  Component Value Date   TSH 1.79 10/06/2023   Lab Results  Component Value Date   HGBA1C 5.6 10/06/2023   Lab Results  Component Value Date   CHOL 156 06/23/2024   HDL 53 06/23/2024   LDLCALC 87 06/23/2024   LDLDIRECT 88.0 10/06/2023   TRIG 74 06/23/2024   CHOLHDL 3 10/06/2023    Significant Diagnostic Results in last 30 days:  No results found.  Assessment/Plan No problem-specific Assessment & Plan notes found for this encounter.   Eczema of lower extremity Eczema on ankle with inflammation, no significant discomfort. - Apply triamcinolone  cream twice daily for one week.  - apply triamcinolone  with Eucerin or Aquaphor  After 7 days stop triamcinolone  but continue eucerin/aquaphor to area - Apply after showering and in the evening.  Depression Moderate to severe depression, uncertain Zoloft  efficacy however reports good support system.  - Continue Zoloft  100 mg daily with lifestyle modifications  Vitamin B12 deficiency Managed with monthly B12 injections. -follow up  B12 level test.  Vitamin D  deficiency Managed with weekly vitamin D  supplementation. - Ordered vitamin D  level  test.  Constipation Occasional constipation without significant issues. Stop tylenol  with benadryl as this can add to constipation.  - Monitor bowel movements and dietary intake.  Hyperlipidemia Well-controlled with diet, recent labs showed good levels. - Continue current dietary management. - Monitor cholesterol levels periodically.  Cognitive impairment Memory issues, independent in daily activities, driving safety concerns. - he discussed discontinuation of driving for safety. SW to monitor.   Insomnia Overall controlled Will stop tylenol  with benadryl due to side effects Can use melatonin 1 mg at bedtime prn   Jon Sherman BODILY Jon Sherman [phone]

## 2024-10-17 LAB — CBC AND DIFFERENTIAL
HCT: 39 — AB (ref 41–53)
Hemoglobin: 13.1 — AB (ref 13.5–17.5)
Neutrophils Absolute: 3810
Platelets: 218 K/uL (ref 150–400)
WBC: 6

## 2024-10-17 LAB — HEPATIC FUNCTION PANEL
ALT: 13 U/L (ref 10–40)
AST: 12 — AB (ref 14–40)
Alkaline Phosphatase: 64 (ref 25–125)
Bilirubin, Total: 0.8

## 2024-10-17 LAB — BASIC METABOLIC PANEL WITH GFR
BUN: 21 (ref 4–21)
CO2: 27 — AB (ref 13–22)
Chloride: 102 (ref 99–108)
Creatinine: 1.1 (ref 0.6–1.3)
Glucose: 112
Potassium: 4 meq/L (ref 3.5–5.1)
Sodium: 138 (ref 137–147)

## 2024-10-17 LAB — VITAMIN B12: Vitamin B-12: 352

## 2024-10-17 LAB — COMPREHENSIVE METABOLIC PANEL WITH GFR
Albumin: 4.1 (ref 3.5–5.0)
Calcium: 8.8 (ref 8.7–10.7)
Globulin: 1.7
eGFR: 64

## 2024-10-17 LAB — VITAMIN D 25 HYDROXY (VIT D DEFICIENCY, FRACTURES): Vit D, 25-Hydroxy: 12

## 2024-10-17 LAB — CBC: RBC: 4.05 (ref 3.87–5.11)

## 2024-10-18 ENCOUNTER — Ambulatory Visit: Admitting: Internal Medicine

## 2024-10-24 DIAGNOSIS — H353122 Nonexudative age-related macular degeneration, left eye, intermediate dry stage: Secondary | ICD-10-CM | POA: Diagnosis not present

## 2024-11-28 ENCOUNTER — Other Ambulatory Visit: Payer: Self-pay | Admitting: Orthopedic Surgery

## 2024-11-28 DIAGNOSIS — R296 Repeated falls: Secondary | ICD-10-CM

## 2024-11-29 ENCOUNTER — Non-Acute Institutional Stay: Payer: Self-pay | Admitting: Nurse Practitioner

## 2024-11-29 ENCOUNTER — Encounter: Payer: Self-pay | Admitting: Nurse Practitioner

## 2024-11-29 DIAGNOSIS — J101 Influenza due to other identified influenza virus with other respiratory manifestations: Secondary | ICD-10-CM

## 2024-11-29 NOTE — Progress Notes (Signed)
 " Location:  Other [facility].  Nursing Home Room Number: [place]ph[place] [id number] Place of Service:  ALF (13) Harlene Caro PIETY  PCP: Laurence Locus, DO  Patient Care Team: Laurence Locus, DO as PCP - General (Internal Medicine) Caro Harlene POUR, NP as Nurse Practitioner (Geriatric Medicine)  Extended Emergency Contact Information Primary Emergency Contact: [facility] Address: [address] [place], [place] [address] [place] Home Phone: [phone] Mobile Phone: [phone] Relation: Spouse Secondary Emergency Contact: [facility] Address: [address]          [place], [place] [address] [place] Home Phone: [phone] Mobile Phone: [phone] Relation: Daughter  Goals of care: Advanced Directive information    10/11/2024    2:44 PM  Advanced Directives  Does Patient Have a Medical Advance Directive? Yes  Type of Advance Directive Living will;Out of facility DNR (pink MOST or yellow form)  Does patient want to make changes to medical advance directive? No - Patient declined     Chief Complaint  Patient presents with   Cough and Congestion.     Cough and Congestion.     HPI:  Pt is a 89 y.o. male seen today for an acute visit for Cough and Congestion.   Pt  presents with fatigue and cough following a recent flu diagnosis.  He has been experiencing fatigue and cough since being diagnosed with the flu 8 days ago. The fatigue has been persistent without significant improvement or worsening. He denies shortness of breath but mentions occasional coughing up of phlegm. He has been taking cough syrup and he is trying to stay hydrated by drinking plenty of water. No shortness of breath, chest pains or edema.    He notes a lack of appetite, which has been ongoing and not specifically related to the recent flu.   Past Medical History:  Diagnosis Date   Basal cell carcinoma 06/29/2018   right forehead   Basal cell  carcinoma 01/06/2023   L nasal ala, MOHs completed 04/02/23   Basal cell carcinoma 07/29/2023   right preauricular, EDC   Basal cell carcinoma 08/02/2024   left tragus, EDC   Cancer (HCC) 1965   melanoma, LNs resected   COPD (chronic obstructive pulmonary disease) (HCC)    Infectious colitis 2000   requiring hospitalization   Influenza 02/18/2018   Melanoma (HCC) 1964   right abdomen   Melanoma in situ (HCC) 10/09/2020   right ear helix, MOHs 12/10/20 with Dr. Bluford   Melanoma in situ of right lower extremity (HCC) 07/05/2018   right medial knee. Excised, margins free.   Melanoma of face (HCC) 08/20/2020   right lower cheek 0.88mm, Clarks Level II, Mohs with Dr. Bluford 09/28/20   Rosacea    Vertigo    1 episode (2019)   Past Surgical History:  Procedure Laterality Date   basal cell removal on nose  03/2023   on nose   CATARACT EXTRACTION W/PHACO Right 06/29/2019   Procedure: CATARACT EXTRACTION PHACO AND INTRAOCULAR LENS PLACEMENT (IOC) RIGHT;  Surgeon: Mittie Gaskin, MD;  Location[facility];  Service: Ophthalmology;  Laterality: Right;   CATARACT EXTRACTION W/PHACO Left 08/31/2019   Procedure: CATARACT EXTRACTION PHACO AND INTRAOCULAR LENS PLACEMENT (IOC) LEFT 02:06.7  25.2%  32.01;  Surgeon: Mittie Gaskin, MD;  Location: [facility];  Service: Ophthalmology;  Laterality: Left;   MELANOMA EXCISION WITH SENTINEL LYMPH NODE BIOPSY     SOFT TISSUE TUMOR  RESECTION  11/18/1983    Allergies[1]  Outpatient Encounter Medications as of 11/29/2024  Medication Sig   acetaminophen  (TYLENOL ) 325 MG tablet Take 650 mg by mouth every 4 (four) hours as needed.   alum & mag hydroxide-simeth (MAALOX/MYLANTA) 200-200-20 MG/5 SUSP Give 2 Tbsp by mouth every 4 hours as needed for gas, indigestion, or upset stomach supervised self-administration Notify MD if no relief   bismuth subsalicylate (PEPTO BISMOL) 262 MG/15ML suspension 10 cc by mouth as needed for loose  stool supervised self administration   carbamide peroxide (DEBROX) 6.5 % OTIC solution Place 5 drops into both ears as needed.   cetirizine (ZYRTEC) 5 MG tablet Take 5 mg by mouth as needed for allergies.   cyanocobalamin  (VITAMIN B12) 1000 MCG/ML injection Inject 1 mL once a  month   dextromethorphan-guaiFENesin (ROBITUSSIN-DM) 10-100 MG/5ML liquid Give 2 tsp by mouth every 4 hours as needed for coughing. supervised self-administration Notify MD if no relief in 3 days or continued use   diphenhydramine-acetaminophen  (TYLENOL  PM) 25-500 MG TABS tablet Take 1 tablet by mouth daily as needed.   Glucose 15 GM/32ML GEL Take 1 packet by mouth as needed.   magnesium hydroxide (MILK OF MAGNESIA) 400 MG/5ML suspension Give 2 Tbsp by mouth as needed for constipation. supervised self-administration daily  Call MD if no relief in 3 days of continued use   melatonin 1 MG TABS tablet Take 1 mg by mouth at bedtime as needed.   metroNIDAZOLE  (METROGEL ) 0.75 % gel APPLY A THIN COAT TO THE FACE EVERY DAY   Multiple Vitamins-Minerals (PRESERVISION AREDS 2) CAPS Take 1 capsule by mouth 2 (two) times daily.   nystatin (MYCOSTATIN/NYSTOP) powder Apply to excoriated area topically as needed for skin breakdown supervised selfadministration twice daily   ondansetron (ZOFRAN) 4 MG tablet Take 4 mg by mouth daily as needed for nausea or vomiting.   OXYGEN Inhale 2 L into the lungs. May start for dyspnea or SOB   sertraline  (ZOLOFT ) 100 MG tablet Take 1 tablet (100 mg total) by mouth daily.   Vitamin D , Ergocalciferol , (DRISDOL ) 1.25 MG (50000 UNIT) CAPS capsule Take 50,000 Units by mouth every 7 (seven) days.   No facility-administered encounter medications on file as of 11/29/2024.    Review of Systems  Constitutional:  Positive for unexpected weight change. Negative for activity change, appetite change and fatigue.  HENT:  Negative for congestion and hearing loss.   Eyes: Negative.   Respiratory:  Positive for  cough. Negative for chest tightness, shortness of breath and wheezing.   Cardiovascular:  Negative for chest pain, palpitations and leg swelling.  Gastrointestinal:  Negative for abdominal pain, constipation and diarrhea.  Genitourinary:  Negative for difficulty urinating and dysuria.  Neurological:  Negative for dizziness and weakness.  Psychiatric/Behavioral:  Negative for agitation, behavioral problems and confusion.     Immunization History  Administered Date(s) Administered   Fluad Quad(high Dose 65+) 08/26/2019, 08/27/2020, 08/21/2021   INFLUENZA, HIGH DOSE SEASONAL PF 09/01/2023   Influenza Split 09/12/2013   Influenza-Unspecified 09/17/2012, 09/15/2014, 08/19/2022, 08/30/2024   Moderna Covid-19 Fall Seasonal Vaccine 52yrs & older 09/26/2022   Moderna Covid-19 Vaccine Bivalent Booster 64yrs & up 10/11/2021   Moderna Sars-Covid-2 Vaccination 12/02/2019, 12/30/2019, 10/02/2020, 04/04/2021   PNEUMOCOCCAL CONJUGATE-20 09/19/2022   Pfizer Covid-19 Vaccine Bivalent Booster 94yrs & up 04/15/2022   Pneumococcal Conjugate-13 11/20/2014   Pneumococcal Polysaccharide-23 11/20/2005   Tdap 02/22/2013, 07/26/2013   Unspecified SARS-COV-2 Vaccination 09/09/2024   Zoster Recombinant(Shingrix) 02/11/2018   Pertinent  Health Maintenance Due  Topic Date Due   Influenza Vaccine  Completed      04/15/2024    3:16 PM 04/19/2024   11:06 AM 05/05/2024    2:10 PM 05/17/2024   10:20 AM 09/08/2024   11:44 AM  Fall Risk  Falls in the past year? 1 1 1  0 1  Was there an injury with Fall? 0  0  0  0  0   Fall Risk Category Calculator 1 1 1  0 1  Patient at Risk for Falls Due to History of fall(s) History of fall(s) History of fall(s) No Fall Risks History of fall(s)  Fall risk Follow up Falls evaluation completed Falls evaluation completed Falls evaluation completed Falls evaluation completed;Falls prevention discussed Falls evaluation completed     Data saved with a previous flowsheet row definition    Functional Status Survey:    Vitals:   11/29/24 1240  BP: (!) 155/82  Pulse: 71  Resp: 16  Temp: 98 F (36.7 C)  SpO2: 97%  Weight: 165 lb 11.2 oz (75.2 kg)  Height: 5' 8 (1.727 m)   Wt Readings from Last 3 Encounters:  11/29/24 165 lb 11.2 oz (75.2 kg)  10/11/24 173 lb (78.5 kg)  09/08/24 173 lb (78.5 kg)    Body mass index is 25.19 kg/m. Physical Exam Constitutional:      General: He is not in acute distress.    Appearance: He is well-developed. He is not diaphoretic.  HENT:     Head: Normocephalic and atraumatic.     Right Ear: External ear normal.     Left Ear: External ear normal.     Mouth/Throat:     Pharynx: No oropharyngeal exudate.  Eyes:     Conjunctiva/sclera: Conjunctivae normal.     Pupils: Pupils are equal, round, and reactive to light.  Cardiovascular:     Rate and Rhythm: Normal rate and regular rhythm.     Heart sounds: Normal heart sounds.  Pulmonary:     Effort: Pulmonary effort is normal.     Breath sounds: Normal breath sounds.  Abdominal:     General: Bowel sounds are normal.     Palpations: Abdomen is soft.  Musculoskeletal:        General: No tenderness.     Cervical back: Normal range of motion and neck supple.     Right lower leg: No edema.     Left lower leg: No edema.  Skin:    General: Skin is warm and dry.  Neurological:     Mental Status: He is alert and oriented to person, place, and time.     Labs reviewed: Recent Labs    03/24/24 1110 06/23/24 0000 10/17/24 0000  NA 138 140 138  K 4.4 3.9 4.0  CL 102 108 102  CO2 30 26* 27*  GLUCOSE 92  --   --   BUN 20 24* 21  CREATININE 1.15 1.2 1.1  CALCIUM 8.7 8.6* 8.8   Recent Labs    03/24/24 1110 06/23/24 0000 10/17/24 0000  AST 10 9* 12*  ALT 10 9* 13  ALKPHOS 61 57 64  BILITOT 0.6  --   --   PROT 5.9*  --   --   ALBUMIN 4.1 3.3* 4.1   Recent Labs    06/23/24 0000 10/17/24 0000  WBC 5.3 6.0  NEUTROABS 3,291.00 3,810.00  HGB 11.7* 13.1*  HCT 36* 39*   PLT 185 218   Lab Results  Component  Value Date   TSH 1.79 10/06/2023   Lab Results  Component Value Date   HGBA1C 5.6 10/06/2023   Lab Results  Component Value Date   CHOL 156 06/23/2024   HDL 53 06/23/2024   LDLCALC 87 06/23/2024   LDLDIRECT 88.0 10/06/2023   TRIG 74 06/23/2024   CHOLHDL 3 10/06/2023    Significant Diagnostic Results in last 30 days:    PROCEDURE: Chest X-Ray AP/LAT 11/28/2024 STATUS: Final  INTERPRETATION:  Chest X-Ray AP/LAT: . CHEST AP/LATERAL: AP and lateral chest reveal cardiac and mediastinal structures are intact. Lung fields and costophrenic angles are clear. SABRA IMPRESSION: No acute pulmonary infiltrate. Assessment/Plan Influenza with persistent cough and congestion Persistent cough and congestion post-influenza. No pneumonia on xray  - Prescribed Mucinex DM twice daily for one week. - Advised taking with a full glass of water. - Encouraged increased hydration.  Unintentional weight loss and decreased appetite Decreased appetite ongoing.  encouraged to liberalize diet. To have protein supplement in addition to smallest meal of the day.  Audra Kagel K. Caro BODILY [facility] [phone]         [1]  Allergies Allergen Reactions   Demerol     Caused patient to feel nauseated   Meperidine     Other reaction(s): Unknown   "

## 2025-01-24 ENCOUNTER — Ambulatory Visit: Admitting: Dermatology

## 2025-05-23 ENCOUNTER — Ambulatory Visit
